# Patient Record
Sex: Male | Born: 1960
Health system: Southern US, Community
[De-identification: ages and names within clinical notes are randomized; demographics above are authoritative.]

## PROBLEM LIST (undated history)

## (undated) DIAGNOSIS — F418 Other specified anxiety disorders: Secondary | ICD-10-CM

## (undated) DIAGNOSIS — I10 Essential (primary) hypertension: Secondary | ICD-10-CM

## (undated) DIAGNOSIS — T7840XA Allergy, unspecified, initial encounter: Secondary | ICD-10-CM

## (undated) DIAGNOSIS — E785 Hyperlipidemia, unspecified: Secondary | ICD-10-CM

## (undated) DIAGNOSIS — F410 Panic disorder [episodic paroxysmal anxiety] without agoraphobia: Secondary | ICD-10-CM

## (undated) DIAGNOSIS — I2510 Atherosclerotic heart disease of native coronary artery without angina pectoris: Secondary | ICD-10-CM

## (undated) DIAGNOSIS — F319 Bipolar disorder, unspecified: Secondary | ICD-10-CM

## (undated) DIAGNOSIS — Z87442 Personal history of urinary calculi: Secondary | ICD-10-CM

## (undated) DIAGNOSIS — T300 Burn of unspecified body region, unspecified degree: Secondary | ICD-10-CM

## (undated) DIAGNOSIS — G4700 Insomnia, unspecified: Secondary | ICD-10-CM

## (undated) DIAGNOSIS — M6282 Rhabdomyolysis: Principal | ICD-10-CM

## (undated) DIAGNOSIS — I219 Acute myocardial infarction, unspecified: Secondary | ICD-10-CM

## (undated) DIAGNOSIS — K219 Gastro-esophageal reflux disease without esophagitis: Secondary | ICD-10-CM

## (undated) DIAGNOSIS — K8020 Calculus of gallbladder without cholecystitis without obstruction: Secondary | ICD-10-CM

## (undated) HISTORY — DX: Essential (primary) hypertension: I10

## (undated) HISTORY — DX: Hyperlipidemia, unspecified: E78.5

## (undated) HISTORY — DX: Acute myocardial infarction, unspecified: I21.9

## (undated) HISTORY — DX: Calculus of gallbladder without cholecystitis without obstruction: K80.20

## (undated) HISTORY — DX: Insomnia, unspecified: G47.00

## (undated) HISTORY — DX: Atherosclerotic heart disease of native coronary artery without angina pectoris: I25.10

## (undated) HISTORY — DX: Personal history of urinary calculi: Z87.442

## (undated) HISTORY — DX: Burn of unspecified body region, unspecified degree: T30.0

## (undated) HISTORY — DX: Allergy, unspecified, initial encounter: T78.40XA

## (undated) HISTORY — DX: Gastro-esophageal reflux disease without esophagitis: K21.9

## (undated) HISTORY — DX: Panic disorder (episodic paroxysmal anxiety): F41.0

## (undated) HISTORY — PX: CORONARY ANGIOPLASTY WITH STENT PLACEMENT: SHX49

## (undated) HISTORY — DX: Other specified anxiety disorders: F41.8

---

## 1980-06-28 HISTORY — PX: ORIF SHOULDER DISLOCATION W/ HUMERAL FRACTURE: SUR959

## 1980-06-28 HISTORY — PX: SKIN FULL THICKNESS GRAFT: SHX442

## 1998-08-19 ENCOUNTER — Encounter: Payer: Self-pay | Admitting: Emergency Medicine

## 1998-08-19 ENCOUNTER — Emergency Department (HOSPITAL_COMMUNITY): Admission: EM | Admit: 1998-08-19 | Discharge: 1998-08-19 | Payer: Self-pay | Admitting: Emergency Medicine

## 1999-02-18 ENCOUNTER — Ambulatory Visit (HOSPITAL_COMMUNITY): Admission: RE | Admit: 1999-02-18 | Discharge: 1999-02-18 | Payer: Self-pay | Admitting: *Deleted

## 1999-02-18 ENCOUNTER — Encounter: Payer: Self-pay | Admitting: *Deleted

## 1999-03-09 ENCOUNTER — Ambulatory Visit (HOSPITAL_COMMUNITY): Admission: AD | Admit: 1999-03-09 | Discharge: 1999-03-09 | Payer: Self-pay | Admitting: *Deleted

## 1999-03-09 ENCOUNTER — Encounter: Payer: Self-pay | Admitting: *Deleted

## 1999-03-20 ENCOUNTER — Encounter: Payer: Self-pay | Admitting: *Deleted

## 1999-03-20 ENCOUNTER — Ambulatory Visit (HOSPITAL_COMMUNITY): Admission: RE | Admit: 1999-03-20 | Discharge: 1999-03-20 | Payer: Self-pay | Admitting: *Deleted

## 2000-10-19 ENCOUNTER — Emergency Department (HOSPITAL_COMMUNITY): Admission: EM | Admit: 2000-10-19 | Discharge: 2000-10-20 | Payer: Self-pay | Admitting: Emergency Medicine

## 2000-11-10 ENCOUNTER — Ambulatory Visit (HOSPITAL_COMMUNITY): Admission: RE | Admit: 2000-11-10 | Discharge: 2000-11-10 | Payer: Self-pay | Admitting: Gastroenterology

## 2003-05-02 ENCOUNTER — Encounter: Payer: Self-pay | Admitting: Cardiology

## 2003-06-29 LAB — HM COLONOSCOPY: HM Colonoscopy: NORMAL

## 2009-01-29 ENCOUNTER — Emergency Department: Payer: Self-pay | Admitting: Unknown Physician Specialty

## 2009-04-09 ENCOUNTER — Emergency Department (HOSPITAL_COMMUNITY): Admission: EM | Admit: 2009-04-09 | Discharge: 2009-04-09 | Payer: Self-pay | Admitting: Emergency Medicine

## 2009-04-29 ENCOUNTER — Encounter: Payer: Self-pay | Admitting: Cardiology

## 2009-07-23 ENCOUNTER — Encounter: Payer: Self-pay | Admitting: Cardiology

## 2009-07-24 ENCOUNTER — Encounter: Payer: Self-pay | Admitting: Cardiology

## 2009-07-29 ENCOUNTER — Encounter: Payer: Self-pay | Admitting: Cardiology

## 2009-07-29 DIAGNOSIS — R079 Chest pain, unspecified: Secondary | ICD-10-CM

## 2009-07-29 DIAGNOSIS — Z9861 Coronary angioplasty status: Secondary | ICD-10-CM

## 2009-07-30 ENCOUNTER — Encounter (INDEPENDENT_AMBULATORY_CARE_PROVIDER_SITE_OTHER): Payer: Self-pay | Admitting: *Deleted

## 2009-07-30 ENCOUNTER — Ambulatory Visit: Payer: Self-pay | Admitting: Cardiology

## 2009-07-30 DIAGNOSIS — R0989 Other specified symptoms and signs involving the circulatory and respiratory systems: Secondary | ICD-10-CM

## 2009-07-30 DIAGNOSIS — K591 Functional diarrhea: Secondary | ICD-10-CM

## 2009-07-30 DIAGNOSIS — I10 Essential (primary) hypertension: Secondary | ICD-10-CM

## 2009-07-30 DIAGNOSIS — F322 Major depressive disorder, single episode, severe without psychotic features: Secondary | ICD-10-CM | POA: Insufficient documentation

## 2009-07-31 ENCOUNTER — Telehealth (INDEPENDENT_AMBULATORY_CARE_PROVIDER_SITE_OTHER): Payer: Self-pay | Admitting: *Deleted

## 2009-08-04 ENCOUNTER — Ambulatory Visit: Payer: Self-pay | Admitting: Cardiology

## 2009-08-04 ENCOUNTER — Encounter: Payer: Self-pay | Admitting: Cardiology

## 2009-10-16 ENCOUNTER — Ambulatory Visit (HOSPITAL_COMMUNITY): Payer: Self-pay | Admitting: Psychiatry

## 2010-07-28 NOTE — Progress Notes (Signed)
Summary: ED visit  Phone Note Other Incoming   Summary of Call: Per Dr. Andee Lineman, states pt. called his cell phone stating he was having worsening headaches and panic attacks.  MD advised pt. to go to ED for evaluation.   Initial call taken by: Hoover Brunette, LPN,  July 31, 2009 4:38 PM

## 2010-07-28 NOTE — Letter (Signed)
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 [address]   [place], [place] [address]   Phone: [phone]  Fax: [phone]    July 30, 2009   Employee:  KESHAWN FIORITO    To Whom It May Concern:   For Medical reasons, please excuse the above named employee from work for the following dates:  Start:   07/30/2009  End:   07/31/2009   MAY RETURN TO WORK ON 07/31/2009  If you need additional information, please feel free to contact our office.         Sincerely,    Vicky Slaughter     [phone] ext. [phone] Patient Care Coordinator  Mr. Serpe is being scheduled @ [facility] on Monday, August 04, 2009 for a Stress Cardiolite test and cartoid dopplers.  He is to be at the hospital 7:30am for his test to begin at 8:00am.. The test will take approximately 4 hours.

## 2010-07-28 NOTE — Letter (Signed)
Summary: Pharmacist, community at [facility]. [address]   [place], [place] [address]   Phone: [phone]  Fax: [phone]      [facility] Cardiovascular Services  Cardiolite Stress Test     [facility]  Your doctor has ordered a Cardiolite Stress Test to help determine the condition of your heart during stress. If you take blood pressure medicine, ask your doctor if you should take it the day of your test. You should not have anything to eat or drink at least 4 hours before your test is scheduled, and no caffeine (coffee, tea, decaf. or chocolate) for 24 hours before your test.   You will need to register at the Outpatient/Main Entrance at the hospital 30 minutes before your appointment time. It is a good idea to bring a copy of your order with you. They will direct you to the Diagnostic Imaging (Radiology) Department.  You will be asked to undress from the waist up and be given a hospital gown to wear, so dress comfortably from the waist down, for example:    Sweat pants, shorts or skirt   Rubber-soled lace up shoes (i.e. tennis shoes)  Plan on about three hours from registration to release from the hospital.    Date of Test:              Time of Test

## 2010-07-28 NOTE — Letter (Signed)
Summary: [facility]  [facility]   Imported By: Zachary George 07/29/2009 14:09:34  _____________________________________________________________________  External Attachment:    Type:   Image     Comment:   External Document

## 2010-07-28 NOTE — Assessment & Plan Note (Signed)
Summary: NP-CHEST PAIN  SASSER REQUESTED ASAP   Visit Type:  chest pain Primary Provider:  Dr.Sasser  CC:  chest pain.  History of Present Illness: the patientis a 50 year old male with a history of coronary artery disease. The patient status post myocardial infarction in 2004 [place]. He underwent stent placement x2. He has not received any cardiac followup. At that time he presented with sudden onset of nausea diaphoresis and severe substernal chest pain. The patient hast been referred by Dr. Cleta Alberts evaluation of atypical chest pain. The patient has a number of complaints including headache is both frontal and occipital. He states his headache is constant and has been so for several months. For the last week he also suffers from severe insomnia. He feels very anxious and appears to have frequent panic attacks. At that time he feels his heart racing, feels suffocated short of breath tight feeling in his chest. He also notices that his heart races quite a bit. He denies however any presyncope or syncope.  Patient moved up from [place] to take care of this 76 year old mother. However he has no social network here and feels poorly accepted in the community of the [place]. His son is still in [place]. The patient feels very depressed. He reports to me that several months ago in brief suicidal thoughts, but definitely has no recurrent feelings of suicidal ideation.   The patient has multiple risk factors for coronary arteries in addition to his known coronary artery disease and continues to smoke up to a pack a day. The patient denies any orthopnea or PND.  Preventive Screening-Counseling & Management  Alcohol-Tobacco     Smoking Status: current     Smoking Cessation Counseling: yes     Packs/Day: 1 PPD  Current Medications (verified): 1)  Multivitamins  Tabs (Multiple Vitamin) .... Take 1 Tablet By Mouth Once A Day 2)  Paxil 40 Mg Tabs (Paroxetine Hcl) .... Take 1 Tablet By Mouth Once A Day 3)   Clonazepam 0.25 Mg Tbdp (Clonazepam) .... Take 1 Tablet By Mouth Two Times A Day 4)  Trazodone Hcl 50 Mg Tabs (Trazodone Hcl) .... Take 1 Tab By Mouth At Bedtime, May Increase To 100mg  As Needed 5)  Propranolol Hcl Cr 80 Mg Xr24h-Cap (Propranolol Hcl) .... Take One Tab Every Morning  Allergies (verified): 1)  ! Codeine  Comments:  Nurse/Medical Assistant: The patient's medications and allergies were reviewed with the patient and were updated in the Medication and Allergy Lists. Verbally gave names.  Social History: Packs/Day:  1 PPD  Vital Signs:  Patient profile:   50 year old male Height:      66 inches Weight:      147 pounds BMI:     23.81 Pulse rate:   87 / minute BP sitting:   168 / 98  (left arm) Cuff size:   regular  Vitals Entered By: Carlye Grippe (July 30, 2009 8:12 AM) CC: chest pain    EKG  Procedure date:  07/30/2009  Findings:      normal sinus rhythm heart rate 82 beats per minute. PR Interval 126 ms. QTC 460 ms. IVCD otherwise normal tracing  Impression & Recommendations:  Problem # 1:  MAJ DPRSV D/O 1 EPIS SEV W/O MENTION PSYCHOT BHV (ICD-296.23) the patient does not appear to be responding to Paroxetine. His dose was increased from 20 mg 40 mg several months ago. I told him that I will switch him to venlafaxine however because of the relative contraindication  with ischemia we'll first perform a stress test. I also start the patient clonazepam 0.25 mg p.o. b.i.d. and trazodone up to 100 mg q.h.s. for his insomnia and also synergistic medication to hisParoxitine. I told the patient that he has suicidal thoughts he will need to call me immediately and the patient should be hospitalized. He is understanding of my recommendations and will keep in close touch. I gave the patient my cell phone number he has any immediate questions.  Problem # 2:  CAROTID BRUIT, RIGHT (ICD-785.9) the patient is a right carotid bruit on exam.ordered carotid  Dopplers Orders: Carotid Duplex (Carotid Duplex)  Problem # 3:  CHEST PAIN UNSPECIFIED (ICD-786.50) although the patient has known coronary artery disease I feel his chest pain is very atypical. He does need a exercise Cardiolite study, participates this may be normal. His chest pain is much more likely related to anxiety. I think his anxiety is also response to depression. The patient's EKG was reviewed and demonstrates normal sinus rhythm nonspecific IVCD in no acute ischemic changes. His updated medication list for this problem includes:    Propranolol Hcl Cr 80 Mg Xr24h-cap (Propranolol hcl) .Marland Kitchen... Take one tab every morning  Orders: EKG w/ Interpretation (93000) Nuclear Med (Nuc Med)  Problem # 4:  FUNCTIONAL DIARRHEA (ICD-564.5) the patient reports loose stools as a matter of fact with incontinence and is currently wearing diapers. The patient is very concerned and shameful about this. I told him it is likely related to his anxiety and panic attacks and is more of a functional nature. However the symptoms did not improve on medical therapy or GI evaluation may be needed  Problem # 5:  ESSENTIAL HYPERTENSION, BENIGN (ICD-401.1) the patient's blood pressure poorly controlled. Again this could be an ascending of anxiety. I prescribed propranolol as an antihypertensive, anticipate that his beta blockade may help with anxiety. We will follow blood pressure closely. His updated medication list for this problem includes:    Propranolol Hcl Cr 80 Mg Xr24h-cap (Propranolol hcl) .Marland Kitchen... Take one tab every morning  Patient Instructions: 1)  Exercise Stress Test 2)  Carotid Doppler 3)  Clonazepam 0.25mg  two times a day  4)  Trazodone 50mg  at bedtime, may increase to 100mg  if needed  5)  Propranolol LA 80mg  every morning  6)  Follow up in  2 weeks. Prescriptions: PROPRANOLOL HCL CR 80 MG XR24H-CAP (PROPRANOLOL HCL) take one tab every morning  #60 x 0   Entered by:   Hoover Brunette, LPN   Authorized  by:   Lewayne Bunting, MD, [facility]   Signed by:   Hoover Brunette, LPN on 16/03/9603   Method used:   Electronically to        [facility] (retail)       [address]       [place], [place]  [address]       Ph: [phone]       Fax: [phone]   RxID:   [id number] TRAZODONE HCL 50 MG TABS (TRAZODONE HCL) Take 1 tab by mouth at bedtime, may increase to 100mg  as needed  #60 x 0   Entered by:   Hoover Brunette, LPN   Authorized by:   Lewayne Bunting, MD, [facility]   Signed by:   Hoover Brunette, LPN on 04/24/2535   Method used:   Electronically to        [facility] (retail)  [address]       [place], [place]  [address]       Ph: [phone]       Fax: [phone]   RxID:   [id number] CLONAZEPAM 0.25 MG TBDP (CLONAZEPAM) Take 1 tablet by mouth two times a day  #30 x 0   Entered by:   Hoover Brunette, LPN   Authorized by:   Lewayne Bunting, MD, [facility]   Signed by:   Hoover Brunette, LPN on 87/56/4332   Method used:   Print then Give to Patient   RxID:   [id number]

## 2010-07-28 NOTE — Miscellaneous (Signed)
  Clinical Lists Changes  Observations: Added new observation of NUCST CONC: Comments :             1. This study was performed using the Standard Bruce exercise protocol. The patient exercised into stage 3 reaching 146 bpm, 85% MPHR. Maximum METs of 10.1 were achieved.The patient experienced no chest pain. Abnormal ST changes, 1-1.5 mm ST depression II/III/aVF near peak. There was a hypertensive response to stress.  2. Probably abnormal LV perfusion. Limitations and artifact were due to diaphragm or other soft tissue. There was a medium, partially reversible defect in the basal inferior, mid inferoseptal, mid inferior, apical inferior segment(s). The degree of photon reduction was mild to moderate. Suspect related to inferior scar and minor peri-infact ischemia. The patient's calculated post stress LVEF was 49%. TID Ratio normal at 1.04.       (08/04/2009 12:25)      Nuclear ETT  Procedure date:  08/04/2009  Findings:      Comments :             1. This study was performed using the Standard Bruce exercise protocol. The patient exercised into stage 3 reaching 146 bpm, 85% MPHR. Maximum METs of 10.1 were achieved.The patient experienced no chest pain. Abnormal ST changes, 1-1.5 mm ST depression II/III/aVF near peak. There was a hypertensive response to stress.  2. Probably abnormal LV perfusion. Limitations and artifact were due to diaphragm or other soft tissue. There was a medium, partially reversible defect in the basal inferior, mid inferoseptal, mid inferior, apical inferior segment(s). The degree of photon reduction was mild to moderate. Suspect related to inferior scar and minor peri-infact ischemia. The patient's calculated post stress LVEF was 49%. TID Ratio normal at 1.04.        Appended Document:  No definite ischemia- routine f/u in clinic.   Appended Document:  Pt has appt for 2/14 as 2 wk f/u. Does he still need to keep this appt or have later follow up?  Appended  Document:  I think he cancelled appointment.

## 2010-07-28 NOTE — Letter (Signed)
Summary: Internal Other/ DEPRESSION SCALE  Internal Other/ DEPRESSION SCALE   Imported By: Dorise Hiss 07/31/2009 10:58:24  _____________________________________________________________________  External Attachment:    Type:   Image     Comment:   External Document

## 2010-07-28 NOTE — Progress Notes (Signed)
Summary: Office Visit/ BLOOD PRESSURE READINGS  Office Visit/ BLOOD PRESSURE READINGS   Imported By: Dorise Hiss 07/31/2009 10:55:56  _____________________________________________________________________  External Attachment:    Type:   Image     Comment:   External Document

## 2010-07-28 NOTE — Letter (Signed)
Summary: External Correspondence/ [facility]  External [facility]   Imported By: Dorise Hiss 08/04/2009 12:38:14  _____________________________________________________________________  External Attachment:    Type:   Image     Comment:   External Document

## 2010-08-06 ENCOUNTER — Inpatient Hospital Stay: Admission: EM | Admit: 2010-08-06 | Payer: Self-pay | Source: Other Acute Inpatient Hospital | Admitting: Cardiology

## 2010-08-06 ENCOUNTER — Inpatient Hospital Stay (HOSPITAL_COMMUNITY)
Admission: EM | Admit: 2010-08-06 | Discharge: 2010-08-08 | DRG: 246 | Disposition: A | Discharge status: Home / Self Care | Payer: 59 | Source: Ambulatory Visit | Attending: Cardiovascular Disease | Admitting: Cardiovascular Disease

## 2010-08-06 DIAGNOSIS — Z79899 Other long term (current) drug therapy: Secondary | ICD-10-CM

## 2010-08-06 DIAGNOSIS — I498 Other specified cardiac arrhythmias: Secondary | ICD-10-CM

## 2010-08-06 DIAGNOSIS — F17200 Nicotine dependence, unspecified, uncomplicated: Secondary | ICD-10-CM | POA: Diagnosis present

## 2010-08-06 DIAGNOSIS — I4901 Ventricular fibrillation: Secondary | ICD-10-CM | POA: Diagnosis not present

## 2010-08-06 DIAGNOSIS — E876 Hypokalemia: Secondary | ICD-10-CM | POA: Diagnosis present

## 2010-08-06 DIAGNOSIS — Z7982 Long term (current) use of aspirin: Secondary | ICD-10-CM

## 2010-08-06 DIAGNOSIS — I2510 Atherosclerotic heart disease of native coronary artery without angina pectoris: Secondary | ICD-10-CM

## 2010-08-06 DIAGNOSIS — F341 Dysthymic disorder: Secondary | ICD-10-CM | POA: Diagnosis present

## 2010-08-06 DIAGNOSIS — R0902 Hypoxemia: Secondary | ICD-10-CM

## 2010-08-06 DIAGNOSIS — I2119 ST elevation (STEMI) myocardial infarction involving other coronary artery of inferior wall: Principal | ICD-10-CM | POA: Diagnosis present

## 2010-08-06 DIAGNOSIS — Z9861 Coronary angioplasty status: Secondary | ICD-10-CM

## 2010-08-06 DIAGNOSIS — J9600 Acute respiratory failure, unspecified whether with hypoxia or hypercapnia: Secondary | ICD-10-CM | POA: Diagnosis not present

## 2010-08-06 DIAGNOSIS — I252 Old myocardial infarction: Secondary | ICD-10-CM

## 2010-08-06 DIAGNOSIS — I6529 Occlusion and stenosis of unspecified carotid artery: Secondary | ICD-10-CM | POA: Diagnosis present

## 2010-08-06 DIAGNOSIS — R570 Cardiogenic shock: Secondary | ICD-10-CM | POA: Diagnosis not present

## 2010-08-06 DIAGNOSIS — Z7902 Long term (current) use of antithrombotics/antiplatelets: Secondary | ICD-10-CM

## 2010-08-06 DIAGNOSIS — I469 Cardiac arrest, cause unspecified: Secondary | ICD-10-CM | POA: Diagnosis not present

## 2010-08-06 DIAGNOSIS — I10 Essential (primary) hypertension: Secondary | ICD-10-CM

## 2010-08-06 LAB — TSH: TSH: 1.015 u[IU]/mL (ref 0.350–4.500)

## 2010-08-06 LAB — DIFFERENTIAL
Lymphocytes Relative: 18 % (ref 12–46)
Lymphs Abs: 1.7 10*3/uL (ref 0.7–4.0)
Neutrophils Relative %: 74 % (ref 43–77)

## 2010-08-06 LAB — MRSA PCR SCREENING: MRSA by PCR: NEGATIVE

## 2010-08-06 LAB — COMPREHENSIVE METABOLIC PANEL
Albumin: 3.4 g/dL — ABNORMAL LOW (ref 3.5–5.2)
BUN: 6 mg/dL (ref 6–23)
Calcium: 7.9 mg/dL — ABNORMAL LOW (ref 8.4–10.5)
Creatinine, Ser: 0.85 mg/dL (ref 0.4–1.5)
GFR calc Af Amer: >60 mL/min (ref >60)
Total Bilirubin: 0.9 mg/dL (ref 0.3–1.2)
Total Protein: 5.8 g/dL — ABNORMAL LOW (ref 6.0–8.3)

## 2010-08-06 LAB — PROTIME-INR: INR: 4.14 — ABNORMAL HIGH (ref 0.00–1.49)

## 2010-08-06 LAB — CARDIAC PANEL(CRET KIN+CKTOT+MB+TROPI)
Total CK: 867 U/L — ABNORMAL HIGH (ref 7–232)
Troponin I: 20.58 ng/mL (ref 0.00–0.06)

## 2010-08-06 LAB — CBC
HCT: 35 % — ABNORMAL LOW (ref 39.0–52.0)
Hemoglobin: 12.6 g/dL — ABNORMAL LOW (ref 13.0–17.0)
MCV: 99.2 fL (ref 78.0–100.0)
RBC: 3.53 MIL/uL — ABNORMAL LOW (ref 4.22–5.81)
WBC: 9.9 10*3/uL (ref 4.0–10.5)

## 2010-08-06 LAB — GLUCOSE, CAPILLARY: Glucose-Capillary: 88 mg/dL (ref 70–99)

## 2010-08-06 LAB — APTT: aPTT: >200 seconds (ref 24–37)

## 2010-08-07 ENCOUNTER — Inpatient Hospital Stay (HOSPITAL_COMMUNITY): Payer: 59

## 2010-08-07 DIAGNOSIS — I219 Acute myocardial infarction, unspecified: Secondary | ICD-10-CM

## 2010-08-07 LAB — POCT I-STAT 3, ART BLOOD GAS (G3+)
O2 Saturation: 100 %
O2 Saturation: 100 %
TCO2: 21 mmol/L (ref 0–100)
pCO2 arterial: 37.8 mmHg (ref 35.0–45.0)
pCO2 arterial: 32.2 mmHg — ABNORMAL LOW (ref 35.0–45.0)

## 2010-08-07 LAB — DRUGS OF ABUSE SCREEN W/O ALC, ROUTINE URINE
Amphetamine Screen, Ur: NEGATIVE
Amphetamine Screen, Ur: NEGATIVE
Barbiturate Quant, Ur: NEGATIVE
Creatinine,U: 57.2 mg/dL
Marijuana Metabolite: NEGATIVE
Marijuana Metabolite: NEGATIVE
Methadone: NEGATIVE
Methadone: NEGATIVE
Phencyclidine (PCP): NEGATIVE

## 2010-08-07 LAB — CBC
HCT: 32.2 % — ABNORMAL LOW (ref 39.0–52.0)
MCHC: 34.5 g/dL (ref 30.0–36.0)
RDW: 12.4 % (ref 11.5–15.5)

## 2010-08-07 LAB — POCT I-STAT, CHEM 8
BUN: 6 mg/dL (ref 6–23)
Creatinine, Ser: 0.8 mg/dL (ref 0.4–1.5)
HCT: 35 % — ABNORMAL LOW (ref 39.0–52.0)
Hemoglobin: 11.9 g/dL — ABNORMAL LOW (ref 13.0–17.0)
Hemoglobin: 12.9 g/dL — ABNORMAL LOW (ref 13.0–17.0)
Sodium: 133 mEq/L — ABNORMAL LOW (ref 135–145)
Sodium: 141 mEq/L (ref 135–145)
TCO2: 17 mmol/L (ref 0–100)
TCO2: 19 mmol/L (ref 0–100)

## 2010-08-07 LAB — LIPID PANEL
Cholesterol: 133 mg/dL (ref 0–200)
HDL: 39 mg/dL — ABNORMAL LOW (ref >39)
Triglycerides: 121 mg/dL (ref <150)

## 2010-08-07 LAB — POCT ACTIVATED CLOTTING TIME: Activated Clotting Time: 458 seconds

## 2010-08-07 LAB — CARDIAC PANEL(CRET KIN+CKTOT+MB+TROPI)
CK, MB: 58.3 ng/mL (ref 0.3–4.0)
Relative Index: 9.9 — ABNORMAL HIGH (ref 0.0–2.5)
Relative Index: 7.5 — ABNORMAL HIGH (ref 0.0–2.5)
Total CK: 1039 U/L — ABNORMAL HIGH (ref 7–232)
Troponin I: 45.58 ng/mL (ref 0.00–0.06)

## 2010-08-07 LAB — BASIC METABOLIC PANEL
GFR calc non Af Amer: >60 mL/min (ref >60)
Potassium: 3.7 mEq/L (ref 3.5–5.1)
Sodium: 140 mEq/L (ref 135–145)

## 2010-08-07 LAB — PLATELET INHIBITION P2Y12: Platelet Function  P2Y12: 79 [PRU] — ABNORMAL LOW (ref 194–418)

## 2010-08-10 ENCOUNTER — Telehealth: Payer: Self-pay | Admitting: Cardiovascular Disease

## 2010-08-11 LAB — OPIATE, QUANTITATIVE, URINE
Codeine Urine: NEGATIVE NG/ML
Hydrocodone: NEGATIVE NG/ML
Hydromorphone GC/MS Conf: NEGATIVE NG/ML
Morphine, Confirm: 3737 NG/ML — ABNORMAL HIGH
Oxycodone, ur: NEGATIVE NG/ML
Oxymorphone: NEGATIVE NG/ML

## 2010-08-11 LAB — BENZODIAZEPINE, QUANTITATIVE, URINE
Alprazolam (GC/LC/MS), ur confirm: NEGATIVE NG/ML
Temazepam GC/MS Conf: NEGATIVE NG/ML

## 2010-08-12 LAB — OPIATE, QUANTITATIVE, URINE
Codeine Urine: NEGATIVE NG/ML
Hydrocodone: NEGATIVE NG/ML
Hydromorphone GC/MS Conf: NEGATIVE NG/ML
Morphine, Confirm: 3043 NG/ML — ABNORMAL HIGH
Oxycodone, ur: NEGATIVE NG/ML

## 2010-08-12 LAB — BENZODIAZEPINE, QUANTITATIVE, URINE
Alprazolam (GC/LC/MS), ur confirm: NEGATIVE ng/mL
Flurazepam GC/MS Conf: NEGATIVE ng/mL
Lorazepam UR QT: NEGATIVE ng/mL
Nordiazepam GC/MS Conf: NEGATIVE ng/mL
Oxazepam GC/MS Conf: NEGATIVE ng/mL
Temazepam GC/MS Conf: NEGATIVE ng/mL

## 2010-08-19 NOTE — Progress Notes (Signed)
Summary:  want a prescription for depression  Phone Note Call from Patient Call back at [phone]   Caller: Patient Summary of Call: Pt want a prescription for depression  Initial call taken by: Judie Grieve,  August 10, 2010 11:42 AM  Follow-up for Phone Call        The pt is having a rough time since MI and having difficulty sleeping.  The pt said he was given Xanax in the hospital but did not get a Rx.  The pt currently does not have a PCP and he agreed to try and arrange an appt with PCP in his area for long term management of depression and anxiety.  The pt has taken Zoloft and Xanax in the past for depression and anxiety but the Zoloft did not help.  I will speak with Dr Excell Seltzer about the pt's situation. 08/26/10 [facility] appt with Dr Excell Seltzer scheduled.  Follow-up by: Julieta Gutting, RN, BSN,  August 10, 2010 12:01 PM  Additional Follow-up for Phone Call Additional follow up Details #1::        Per Dr Excell Seltzer the pt can have a Rx for Xanax 0.5mg  take one by mouth two times a day as needed #30 No Refills.  I spoke with the pt and he will come by our office and pick-up Rx at the front desk.  I made the pt aware that Dr Excell Seltzer will only give this one Rx and future prescriptions should come from PCP.  The pt was given information about [facility] on [facility].  Additional Follow-up by: Julieta Gutting, RN, BSN,  August 11, 2010 10:50 AM    New/Updated Medications: ALPRAZOLAM 0.5 MG TABS (ALPRAZOLAM) take one by mouth two times a day as needed Prescriptions: ALPRAZOLAM 0.5 MG TABS (ALPRAZOLAM) take one by mouth two times a day as needed  #30 x 0   Entered by:   Julieta Gutting, RN, BSN   Authorized by:   Norva Karvonen, MD   Signed by:   Julieta Gutting, RN, BSN on 08/11/2010   Method used:   Print then Give to Patient   RxID:   [id number]

## 2010-08-20 ENCOUNTER — Other Ambulatory Visit: Payer: 59

## 2010-08-20 ENCOUNTER — Telehealth: Payer: Self-pay | Admitting: Internal Medicine

## 2010-08-20 ENCOUNTER — Encounter: Payer: Self-pay | Admitting: Internal Medicine

## 2010-08-20 ENCOUNTER — Ambulatory Visit (INDEPENDENT_AMBULATORY_CARE_PROVIDER_SITE_OTHER): Payer: 59 | Admitting: Internal Medicine

## 2010-08-20 ENCOUNTER — Other Ambulatory Visit: Payer: Self-pay | Admitting: Internal Medicine

## 2010-08-20 DIAGNOSIS — R51 Headache: Secondary | ICD-10-CM

## 2010-08-20 DIAGNOSIS — E785 Hyperlipidemia, unspecified: Secondary | ICD-10-CM

## 2010-08-20 DIAGNOSIS — F329 Major depressive disorder, single episode, unspecified: Secondary | ICD-10-CM | POA: Insufficient documentation

## 2010-08-20 DIAGNOSIS — F17200 Nicotine dependence, unspecified, uncomplicated: Secondary | ICD-10-CM | POA: Insufficient documentation

## 2010-08-20 DIAGNOSIS — I2510 Atherosclerotic heart disease of native coronary artery without angina pectoris: Secondary | ICD-10-CM | POA: Insufficient documentation

## 2010-08-20 DIAGNOSIS — I10 Essential (primary) hypertension: Secondary | ICD-10-CM

## 2010-08-20 DIAGNOSIS — F4322 Adjustment disorder with anxiety: Secondary | ICD-10-CM

## 2010-08-20 DIAGNOSIS — R519 Headache, unspecified: Secondary | ICD-10-CM | POA: Insufficient documentation

## 2010-08-20 DIAGNOSIS — IMO0001 Reserved for inherently not codable concepts without codable children: Secondary | ICD-10-CM

## 2010-08-20 DIAGNOSIS — I252 Old myocardial infarction: Secondary | ICD-10-CM | POA: Insufficient documentation

## 2010-08-20 LAB — HEPATIC FUNCTION PANEL
ALT: 28 U/L (ref 0–53)
Albumin: 3.6 g/dL (ref 3.5–5.2)
Alkaline Phosphatase: 211 U/L — ABNORMAL HIGH (ref 39–117)
Bilirubin, Direct: 0 mg/dL (ref 0.0–0.3)
Total Protein: 7 g/dL (ref 6.0–8.3)

## 2010-08-20 LAB — CBC WITH DIFFERENTIAL/PLATELET
Basophils Relative: 0.3 % (ref 0.0–3.0)
Eosinophils Absolute: 0.2 10*3/uL (ref 0.0–0.7)
Eosinophils Relative: 2.1 % (ref 0.0–5.0)
Hemoglobin: 13.1 g/dL (ref 13.0–17.0)
MCHC: 35.2 g/dL (ref 30.0–36.0)
MCV: 102 fl — ABNORMAL HIGH (ref 78.0–100.0)
Monocytes Absolute: 0.5 10*3/uL (ref 0.1–1.0)
Neutro Abs: 6 10*3/uL (ref 1.4–7.7)
Neutrophils Relative %: 69.8 % (ref 43.0–77.0)
RBC: 3.67 Mil/uL — ABNORMAL LOW (ref 4.22–5.81)
WBC: 8.5 10*3/uL (ref 4.5–10.5)

## 2010-08-20 LAB — URINALYSIS, ROUTINE W REFLEX MICROSCOPIC
Ketones, ur: NEGATIVE
Specific Gravity, Urine: 1.01 (ref 1.000–1.030)
Total Protein, Urine: NEGATIVE
Urine Glucose: NEGATIVE

## 2010-08-20 LAB — BASIC METABOLIC PANEL
CO2: 29 mEq/L (ref 19–32)
Chloride: 105 mEq/L (ref 96–112)
Creatinine, Ser: 0.8 mg/dL (ref 0.4–1.5)
Potassium: 4.6 mEq/L (ref 3.5–5.1)
Sodium: 142 mEq/L (ref 135–145)

## 2010-08-20 LAB — HIGH SENSITIVITY CRP: CRP, High Sensitivity: 33.72 mg/L — ABNORMAL HIGH (ref 0.00–5.00)

## 2010-08-24 ENCOUNTER — Other Ambulatory Visit: Payer: Self-pay | Admitting: Internal Medicine

## 2010-08-24 DIAGNOSIS — R51 Headache: Secondary | ICD-10-CM

## 2010-08-25 NOTE — Assessment & Plan Note (Signed)
Summary: NEW [employer] PT--#---PKG---STC   Vital Signs:  Patient profile:   50 year old male Height:      66 inches Weight:      137 pounds BMI:     22.19 O2 Sat:      98 % on Room air Temp:     98.4 degrees F oral Pulse rate:   80 / minute Pulse rhythm:   regular Resp:     20 per minute BP sitting:   132 / 82  (left arm) Cuff size:   regular  Vitals Entered By: Rock Nephew CMA (August 20, 2010 10:49 AM)  O2 Flow:  Room air CC: New to establish, Headaches Is Patient Diabetic? No  Does patient need assistance? Functional Status Self care Ambulation Normal   Primary Care Provider:  [name] MD  CC:  New to establish and Headaches.  History of Present Illness:  Headaches      This is a 50 year old man who presents with Headaches.  The symptoms began 2 weeks ago.  On a scale of 1 to 10, the intensity is described as a 6.  The patient reports nausea, but denies vomiting, sweats, tearing of eyes, nasal congestion, sinus pain, sinus pressure, photophobia, and phonophobia.  The headache is described as constant and dull.  The location of the pain is bitemporal.  High-risk features (red flags) include new type of headache, age >50 years, and anticoagulation use.  The patient denies the following high-risk features: fever, neck pain/stiffness, vision loss or change, focal weakness, altered mental status, rash, trauma, pain worse with exertion, immunosuppression, and concomitant infection.  The headaches are precipitated by stress.  Prior treatment has included no medication.    Preventive Screening-Counseling & Management  Alcohol-Tobacco     Alcohol drinks/day: 0     Alcohol Counseling: not indicated; patient does not drink     Feels need to cut down: no     Smoking Status: current     Smoke Cessation Stage: quit     Tobacco Counseling: to remain off tobacco products  Caffeine-Diet-Exercise     Does Patient Exercise: yes  Hep-HIV-STD-Contraception     Hepatitis Risk:  no risk noted     HIV Risk: no risk noted     STD Risk: no risk noted      Sexual History:  currently monogamous.        Drug Use:  no.        Blood Transfusions:  no.    Medications Prior to Update: 1)  None  Current Medications (verified): 1)  Crestor 40 Mg Tabs (Rosuvastatin Calcium) .... Take 1 Tablet By Mouth Once A Day 2)  Metoprolol Tartrate 25 Mg Tabs (Metoprolol Tartrate) .... Take 1 Tablet By Mouth Two Times A Day 3)  Ecotrin 325 Mg Tbec (Aspirin) .... Take 1 Tablet By Mouth Once A Day 4)  Celebrex 200 Mg Caps (Celecoxib) .... One By Mouth Once Daily As Needed For Headache 5)  Clonazepam 1 Mg Tabs (Clonazepam) .Marland Kitchen.. 1-2 By Mouth Two Times A Day As Needed For Anxiety 6)  Medrol (Pak) 4 Mg Tabs (Methylprednisolone) .... Take As Directed For Headache  Allergies (verified): 1)  ! Codeine  Past History:  Past Medical History: Coronary artery disease Hyperlipidemia Hypertension Myocardial infarction, hx of Depression  Past Surgical History: PTCA/stent Rotator cuff repair  Family History: Family History of Arthritis Family History of CAD Male 1st degree relative <50 Family History of Colon CA 1st  degree relative <60 Family History Hypertension  Social History: Divorced Current Smoker Alcohol use-no Drug use-no Regular exercise-yes Smoking Status:  current Hepatitis Risk:  no risk noted HIV Risk:  no risk noted STD Risk:  no risk noted Sexual History:  currently monogamous Blood Transfusions:  no Drug Use:  no Does Patient Exercise:  yes  Review of Systems       The patient complains of headaches.  The patient denies anorexia, fever, weight loss, weight gain, vision loss, decreased hearing, chest pain, syncope, dyspnea on exertion, peripheral edema, prolonged cough, hemoptysis, abdominal pain, hematuria, suspicious skin lesions, transient blindness, difficulty walking, depression, enlarged lymph nodes, and angioedema.   General:  Complains of fatigue,  malaise, and sleep disorder; denies chills, fever, loss of appetite, sweats, weakness, and weight loss. Psych:  Complains of anxiety and panic attacks; denies alternate hallucination ( auditory/visual), depression, easily angered, easily tearful, irritability, mental problems, sense of great danger, suicidal thoughts/plans, thoughts of violence, unusual visions or sounds, and thoughts /plans of harming others.  Physical Exam  General:  alert, well-developed, well-nourished, well-hydrated, appropriate dress, normal appearance, healthy-appearing, cooperative to examination, good hygiene, and underweight appearing.   Head:  normocephalic, atraumatic, no abnormalities observed, and no abnormalities palpated.   Eyes:  vision grossly intact, pupils equal, pupils round, pupils reactive to light, pupils react to accomodation, and no injection.   Ears:  R ear normal and L ear normal.   Nose:  External nasal examination shows no deformity or inflammation. Nasal mucosa are pink and moist without lesions or exudates. Mouth:  Oral mucosa and oropharynx without lesions or exudates.  Teeth in good repair. Neck:  supple, full ROM, no masses, no thyromegaly, no thyroid nodules or tenderness, no JVD, no HJR, normal carotid upstroke, and no carotid bruits.   Lungs:  normal respiratory effort, no intercostal retractions, no accessory muscle use, normal breath sounds, no dullness, no fremitus, no crackles, and no wheezes.   Heart:  normal rate, regular rhythm, no murmur, no gallop, no rub, no JVD, and no HJR.   Abdomen:  soft, non-tender, normal bowel sounds, no distention, no masses, no guarding, no rigidity, no rebound tenderness, no abdominal hernia, no inguinal hernia, no hepatomegaly, and no splenomegaly.   Msk:  No deformity or scoliosis noted of thoracic or lumbar spine.   Pulses:  R and L carotid,radial,femoral,dorsalis pedis and posterior tibial pulses are full and equal bilaterally Extremities:  No clubbing,  cyanosis, edema, or deformity noted with normal full range of motion of all joints.   Neurologic:  No cranial nerve deficits noted. Station and gait are normal. Plantar reflexes are down-going bilaterally. DTRs are symmetrical throughout. Sensory, motor and coordinative functions appear intact. Skin:  turgor normal, color normal, no rashes, no suspicious lesions, no ecchymoses, no petechiae, no purpura, no ulcerations, and no edema.   Cervical Nodes:  no anterior cervical adenopathy and no posterior cervical adenopathy.   Axillary Nodes:  no R axillary adenopathy and no L axillary adenopathy.   Inguinal Nodes:  no R inguinal adenopathy and no L inguinal adenopathy.   Psych:  Oriented X3, memory intact for recent and remote, good eye contact, not agitated, not suicidal, not homicidal, flat affect, and slightly anxious.     Impression & Recommendations:  Problem # 1:  HEADACHE (ICD-784.0) the labs show an inflammatory process so medrol dose pack is started to treat refractory migraine, vasculitis, rebound headache. he also needs a brain imaging to look for mass, bleed, cva,  etc. His updated medication list for this problem includes:    Metoprolol Tartrate 25 Mg Tabs (Metoprolol tartrate) .Marland Kitchen... Take 1 tablet by mouth two times a day    Ecotrin 325 Mg Tbec (Aspirin) .Marland Kitchen... Take 1 tablet by mouth once a day    Celebrex 200 Mg Caps (Celecoxib) ..... One by mouth once daily as needed for headache  Orders: Venipuncture (04540) Radiology Referral (Radiology) TLB-BMP (Basic Metabolic Panel-BMET) (80048-METABOL) TLB-CBC Platelet - w/Differential (85025-CBCD) TLB-Hepatic/Liver Function Pnl (80076-HEPATIC) TLB-TSH (Thyroid Stimulating Hormone) (84443-TSH) TLB-CRP-High Sensitivity (C-Reactive Protein) (86140-FCRP) TLB-CK Total Only(Creatine Kinase/CPK) (82550-CK) TLB-Sedimentation Rate (ESR) (85652-ESR) TLB-Udip w/ Micro (81001-URINE)  Problem # 2:  HYPERTENSION (ICD-401.9) Assessment:  Improved  His updated medication list for this problem includes:    Metoprolol Tartrate 25 Mg Tabs (Metoprolol tartrate) .Marland Kitchen... Take 1 tablet by mouth two times a day  Orders: Venipuncture (98119) TLB-BMP (Basic Metabolic Panel-BMET) (80048-METABOL) TLB-CBC Platelet - w/Differential (85025-CBCD) TLB-Hepatic/Liver Function Pnl (80076-HEPATIC) TLB-TSH (Thyroid Stimulating Hormone) (84443-TSH) TLB-CRP-High Sensitivity (C-Reactive Protein) (86140-FCRP) TLB-CK Total Only(Creatine Kinase/CPK) (82550-CK) TLB-Sedimentation Rate (ESR) (85652-ESR) TLB-Udip w/ Micro (81001-URINE)  BP today: 132/82  Problem # 3:  HYPERLIPIDEMIA (ICD-272.4) Assessment: Unchanged  His updated medication list for this problem includes:    Crestor 40 Mg Tabs (Rosuvastatin calcium) .Marland Kitchen... Take 1 tablet by mouth once a day  Orders: Venipuncture (14782) TLB-BMP (Basic Metabolic Panel-BMET) (80048-METABOL) TLB-CBC Platelet - w/Differential (85025-CBCD) TLB-Hepatic/Liver Function Pnl (80076-HEPATIC) TLB-TSH (Thyroid Stimulating Hormone) (84443-TSH) TLB-CRP-High Sensitivity (C-Reactive Protein) (86140-FCRP) TLB-CK Total Only(Creatine Kinase/CPK) (82550-CK) TLB-Sedimentation Rate (ESR) (85652-ESR) TLB-Udip w/ Micro (81001-URINE)  Problem # 4:  ADJUSTMENT DISORDER WITH ANXIETY (NFA-213.08) Assessment: New try Klonnopin  Problem # 5:  MYALGIA (ICD-729.1) Assessment: New will check CPK level His updated medication list for this problem includes:    Ecotrin 325 Mg Tbec (Aspirin) .Marland Kitchen... Take 1 tablet by mouth once a day    Celebrex 200 Mg Caps (Celecoxib) ..... One by mouth once daily as needed for headache  Orders: Venipuncture (65784) TLB-BMP (Basic Metabolic Panel-BMET) (80048-METABOL) TLB-CBC Platelet - w/Differential (85025-CBCD) TLB-Hepatic/Liver Function Pnl (80076-HEPATIC) TLB-TSH (Thyroid Stimulating Hormone) (84443-TSH) TLB-CRP-High Sensitivity (C-Reactive Protein) (86140-FCRP) TLB-CK Total  Only(Creatine Kinase/CPK) (82550-CK) TLB-Sedimentation Rate (ESR) (85652-ESR) TLB-Udip w/ Micro (81001-URINE)  Complete Medication List: 1)  Crestor 40 Mg Tabs (Rosuvastatin calcium) .... Take 1 tablet by mouth once a day 2)  Metoprolol Tartrate 25 Mg Tabs (Metoprolol tartrate) .... Take 1 tablet by mouth two times a day 3)  Ecotrin 325 Mg Tbec (Aspirin) .... Take 1 tablet by mouth once a day 4)  Celebrex 200 Mg Caps (Celecoxib) .... One by mouth once daily as needed for headache 5)  Clonazepam 1 Mg Tabs (Clonazepam) .Marland Kitchen.. 1-2 by mouth two times a day as needed for anxiety 6)  Medrol (pak) 4 Mg Tabs (Methylprednisolone) .... Take as directed for headache  Patient Instructions: 1)  Please schedule a follow-up appointment in 3-5 days. 2)  Take 650-1000mg  of Tylenol every 4-6 hours as needed for relief of pain or comfort of fever AVOID taking more than 4000mg   in a 24 hour period (can cause liver damage in higher doses). Prescriptions: MEDROL (PAK) 4 MG TABS (METHYLPREDNISOLONE) take as directed for headache  #1 x 0   Entered and Authorized by:   [name] MD   Signed by:   [name] MD on 08/20/2010   Method used:   Print then Give to Patient   RxID:   [id number] MEDROL (PAK) 4 MG TABS (METHYLPREDNISOLONE)  take as directed for headache  #1 x 0   Entered and Authorized by:   [name] MD   Signed by:   [name] MD on 08/20/2010   Method used:   Historical   RxID:   [id number] CLONAZEPAM 1 MG TABS (CLONAZEPAM) 1-2 by mouth two times a day as needed for anxiety  #50 x 2   Entered and Authorized by:   [name] MD   Signed by:   [name] MD on 08/20/2010   Method used:   Print then Give to Patient   RxID:   [id number] CELEBREX 200 MG CAPS (CELECOXIB) One by mouth once daily as needed for headache  #15 x 0   Entered and Authorized by:   [name] MD   Signed by:   [name] MD on 08/20/2010   Method used:   Samples Given    RxID:   [id number]    Orders Added: 1)  Venipuncture [53664] 2)  Radiology Referral [Radiology] 3)  TLB-BMP (Basic Metabolic Panel-BMET) [80048-METABOL] 4)  TLB-CBC Platelet - w/Differential [85025-CBCD] 5)  TLB-Hepatic/Liver Function Pnl [80076-HEPATIC] 6)  TLB-TSH (Thyroid Stimulating Hormone) [84443-TSH] 7)  TLB-CRP-High Sensitivity (C-Reactive Protein) [86140-FCRP] 8)  TLB-CK Total Only(Creatine Kinase/CPK) [82550-CK] 9)  TLB-Sedimentation Rate (ESR) [85652-ESR] 10)  TLB-Udip w/ Micro [81001-URINE] 11)  New Patient Level V [99205]   Immunization History:  Influenza Immunization History:    Influenza:  historical (03/28/2010)   Immunization History:  Influenza Immunization History:    Influenza:  Historical (03/28/2010)  Preventive Care Screening  Last Tetanus Booster:    Date:  06/28/2010    Results:  Historical   Colonoscopy:    Date:  06/29/2003    Results:  normal

## 2010-08-25 NOTE — Progress Notes (Signed)
  Phone Note Outgoing Call   Summary of Call: Maralyn Sago- I saw this guy earlier today and the labs show that he has inflammation and I am concerned that his headache may be from vasculitis, please find out which pharmacy he wants this sent to. Thanks Initial call taken by: [name] MD,  August 20, 2010 1:32 PM  Follow-up for Phone Call        Pt informed  Follow-up by: Lamar Sprinkles, CMA,  August 20, 2010 5:04 PM    Prescriptions: MEDROL (PAK) 4 MG TABS (METHYLPREDNISOLONE) take as directed for headache  #1 x 0   Entered by:   Lamar Sprinkles, CMA   Authorized by:   [name] MD   Signed by:   Lamar Sprinkles, CMA on 08/20/2010   Method used:   Electronically to        [facility]  [phone]* (retail)       [address]       [place], [place]  [address]       Ph: [phone] or [phone]       Fax: [phone]   RxID:   [id number]

## 2010-08-26 ENCOUNTER — Encounter: Payer: Self-pay | Admitting: Cardiovascular Disease

## 2010-08-26 ENCOUNTER — Ambulatory Visit (INDEPENDENT_AMBULATORY_CARE_PROVIDER_SITE_OTHER)
Admission: RE | Admit: 2010-08-26 | Discharge: 2010-08-26 | Disposition: A | Discharge status: Home / Self Care | Payer: 59 | Source: Ambulatory Visit | Attending: Internal Medicine | Admitting: Internal Medicine

## 2010-08-26 ENCOUNTER — Encounter: Payer: 59 | Admitting: Cardiovascular Disease

## 2010-08-26 ENCOUNTER — Encounter (INDEPENDENT_AMBULATORY_CARE_PROVIDER_SITE_OTHER): Payer: 59 | Admitting: Cardiovascular Disease

## 2010-08-26 ENCOUNTER — Other Ambulatory Visit: Payer: 59

## 2010-08-26 DIAGNOSIS — I2119 ST elevation (STEMI) myocardial infarction involving other coronary artery of inferior wall: Secondary | ICD-10-CM

## 2010-08-26 DIAGNOSIS — R51 Headache: Secondary | ICD-10-CM

## 2010-08-26 DIAGNOSIS — R072 Precordial pain: Secondary | ICD-10-CM

## 2010-08-27 ENCOUNTER — Telehealth (INDEPENDENT_AMBULATORY_CARE_PROVIDER_SITE_OTHER): Payer: Self-pay | Admitting: *Deleted
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
## 2010-08-28 ENCOUNTER — Encounter (INDEPENDENT_AMBULATORY_CARE_PROVIDER_SITE_OTHER): Payer: Self-pay | Admitting: *Deleted

## 2010-08-31 ENCOUNTER — Encounter (INDEPENDENT_AMBULATORY_CARE_PROVIDER_SITE_OTHER): Payer: Self-pay | Admitting: *Deleted

## 2010-08-31 ENCOUNTER — Encounter: Payer: Self-pay | Admitting: Internal Medicine

## 2010-08-31 ENCOUNTER — Ambulatory Visit (HOSPITAL_COMMUNITY): Payer: 59 | Attending: Cardiovascular Disease

## 2010-08-31 DIAGNOSIS — R0989 Other specified symptoms and signs involving the circulatory and respiratory systems: Secondary | ICD-10-CM

## 2010-09-01 ENCOUNTER — Telehealth (INDEPENDENT_AMBULATORY_CARE_PROVIDER_SITE_OTHER): Payer: Self-pay | Admitting: *Deleted

## 2010-09-02 ENCOUNTER — Ambulatory Visit (HOSPITAL_COMMUNITY): Payer: 59 | Attending: Cardiovascular Disease

## 2010-09-02 ENCOUNTER — Encounter: Payer: Self-pay | Admitting: Internal Medicine

## 2010-09-02 ENCOUNTER — Encounter: Payer: Self-pay | Admitting: Cardiology

## 2010-09-02 DIAGNOSIS — I252 Old myocardial infarction: Secondary | ICD-10-CM | POA: Insufficient documentation

## 2010-09-02 DIAGNOSIS — R079 Chest pain, unspecified: Secondary | ICD-10-CM

## 2010-09-02 DIAGNOSIS — R0602 Shortness of breath: Secondary | ICD-10-CM

## 2010-09-03 NOTE — Miscellaneous (Signed)
Summary: update med  Clinical Lists Changes  Medications: Added new medication of NITROSTAT 0.4 MG SUBL (NITROGLYCERIN) 1 tablet under tongue at onset of chest pain; you may repeat every 5 minutes for up to 3 doses. 

## 2010-09-03 NOTE — Progress Notes (Signed)
Summary: Nuclear Pre-Procedure  Phone Note Outgoing Call Call back at [phone] CELL   Call placed by: Stanton Kidney, EMT-P,  August 27, 2010 1:55 PM Action Taken: Phone Call Completed Summary of Call: Left message with information on Myoview Information Sheet (see scanned document for details). Stanton Kidney, EMT-P  August 27, 2010 1:55 PM      Nuclear Med Background Indications for Stress Test: Evaluation for Ischemia, Stent Patency, PTCA Patency  Indications Comments: 08/06/10 IWMI > VF > PEA > stents  History: Angioplasty, Echo, Heart Catheterization, Myocardial Infarction, Myocardial Perfusion Study, Stents  History Comments: 08/04/10 MPS: EF=49% 08/06/10 IWMI > angioplasty > RCA stents x 2 08/07/10 Echo: EF=55-60%  Symptoms: Chest Pain, Fatigue, Light-Headedness, Palpitations    Nuclear Pre-Procedure Cardiac Risk Factors: Carotid Disease, Family History - CAD, Hypertension, Lipids, Smoker Height (in): 66

## 2010-09-03 NOTE — Procedures (Signed)
[name], [name]             ACCOUNT NO.:  [id number]  MEDICAL RECORD NO.:  [id number]           PATIENT TYPE:  I  LOCATION:  2914                         FACILITY:  [facility]  PHYSICIAN:  Lael Pilch. Excell Seltzer, MD  DATE OF BIRTH:  1961-06-21  DATE OF PROCEDURE:  08/06/2010 DATE OF DISCHARGE:                           CARDIAC CATHETERIZATION   PROCEDURES: 1. Left heart catheterization. 2. Selective coronary angiography. 3. Left ventricular angiography. 4. Temporary transvenous pacemaker placement. 5. Percutaneous transluminal coronary angioplasty and stenting of the     right coronary artery. 6. CPR for resuscitation of ventricular fibrillation followed by     pulseless electrical activity.  PROCEDURAL INDICATIONS:  Mr. Boardley is a 50 year old who presented with an acute inferior wall infarction.  He has undergone previous stenting of the right coronary artery back in 2006 in [place].  He was having severe and ongoing chest pain on arrival and was taken directly to the Cardiac Cath Lab after transferring him from [facility]. Emergency consent was obtained.  The right groin was prepped, draped and anesthetized with 1% lidocaine. Using the modified Seldinger technique, a 6-French sheath was placed in the right femoral artery.  A JL-4 diagnostic catheter was inserted.  The left coronary artery was imaged.  There was collateralization of the distal right coronary artery.  The patient became very hypotensive and a second image of the left coronary artery demonstrated no distal flow and any of the vascular beds.  All branches of the both the LAD and the left circumflex had TIMI 0-1 flow.  The patient went into ventricular fibrillation and required defibrillation x1.  He had no pulse following defibrillation and he was treated with CPR.  He was oxygenated with a bag-valve-mask.  He was given 0.5 mg of epinephrine and he regained pressure, which we transduced from his femoral  sheath.  He also spontaneously woke up.  Anesthesia was present and we elected to intubate the patient because of his severe hemodynamic instability.  He also had marked bradycardia and we elected to place an emergency transvenous pacemaker.  Venous access was obtained with a 6-French sheath.  A balloon-tipped transvenous pacemaker was advanced to the RV apex and was set at a backup rate of 40.  The patient was intubated and mechanically ventilated, but just prior to that, I was able to reopen the right coronary artery.  A JR-4 guide was used.  The patient had already been given 60 mg of Effient.  He had arrived on a heparin drip and bivalirudin was used for anticoagulation.  A Cougar guidewire was advanced into the distal right coronary artery beyond the areas of severe stenosis.  There were critical lesions off both the proximal and distal edge of the previously placed stent.  The lesions were dilated with a 2.5 x 15-mm apex balloon, which was taken to 8 atmospheres on subsequent inflations.  Following reperfusion, the patient was electively intubated.  His ACT was drawn and it was 458 seconds.  I attempted to pass a 28-mm Promus stent, but it would not cross with the proximal lesion and a 2.5 x 12-mm Riverview Quantum apex balloon  was then advanced and multiple inflations were done up to 10 and 12 atmospheres. This allowed for passage of the stent.  There was a long segment that I thought would be treated.  A 3.0 x 28-mm stent was deployed at 12 atmospheres and appeared well expanded.  A second 3.0 x 28-mm Promus Element stent was deployed in overlapping fashion in the proximal right coronary artery.  Both stents appeared well expanded.  They were postdilated with a 3.25 x 20-mm Stevens Quantum apex balloon, which was taken to 16 atmospheres over multiple inflations so that the entire stented segment was postdilated.  The balloon catheter was removed and final angiography demonstrated an excellent  result with 0% residual stenosis and TIMI 3 flow.  Throughout the procedure, the patient was agitated. He was sedated with multiple doses of IV Versed as well as fentanyl.  We also started him on a propofol drip and gave him propofol boluses.  He eventually required a single dose of vecuronium as a paralytic. Critical Care Medicine was called immediately after the case to help with his sedation and ventilator management.  After the balloon catheter was removed, then the guide catheter was changed out for a pigtail catheter, it was advanced into the left ventricle.  Ventriculography was performed, a pullback across the aortic valve was done.  The patient was transferred to the CCU in critical condition.  PROCEDURAL FINDINGS:  Aortic pressure was 123/85 with a mean of 104. Left ventricular pressure was 102/15.  These pressures were obtained at different times.  There was no significant pullback gradient across the aortic valve.  CORONARY ANGIOGRAPHY:  The left mainstem is patent.  It divides into the LAD and left circumflex.  LAD:  The LAD has moderately severe diffuse disease throughout the proximal and midsegments.  There is a long segment 75% stenosis through the mid-LAD.  There is a large second diagonal branch and also has a 75% proximal stenosis.  The left circumflex has nonobstructive disease. There is 25-30% stenosis in the proximal circumflex.  The OM branches have mild diffuse stenosis of about 50%, but there are no areas of focal high-grade disease.  As noted above, the second image the left coronary artery shows no flow in any of the distal branch vessels and I think this was related to the patient's marked hemodynamic compromise and this angiographic image was performed.  Following the interventional procedure, the left coronary artery was re-imaged with intracoronary nitroglycerin and we were able to obtain good imaging with TIMI 3 flow in all vessels.  Right coronary  artery:  The right coronary artery has a stent in the midportion.  There is a severe 90% stenosis on the proximal edge of the stent.  There is a hazy filling defect on the distal edge of the stent and I suspect is the patient's culprit.  There is 99% stenosis present and TIMI 1 flow beyond that lesion.  The right coronary artery is dominant and supplies a PDA and posterolateral branch.  Left ventriculography shows basal and mid inferior wall akinesis.  The left ventricular ejection fraction is 45%.  There is no mitral regurgitation.  FINAL ASSESSMENT: 1. Subtotal occlusion of the mid-right coronary artery with successful     primary percutaneous coronary intervention using overlapping drug-     eluting stents. 2. Transient shock with ventricular fibrillation followed by pulseless     electrical activity, requiring CPR, epinephrine, and     defibrillation.  The patient transanally lost flow in  all of his     distal coronary arteries and I suspect this was due to his marked     hemodynamic compromise.  He was successfully resuscitated. 3. Moderately severe diffuse left anterior descending artery stenosis. 4. Mild-to-moderate left circumflex stenosis. 5. Mild segmental left ventricular systolic dysfunction.  RECOMMENDATIONS:  The patient will be carefully monitored in the CCU. The Critical Care Medicine team will manage his ventilator and sedation. They will carefully watch his hemodynamics and will continue his dual antiplatelet therapy.  We will have to review his films and decide on continued medical therapy versus revascularization for his LAD/diagonal disease.     [name]. Excell Seltzer, MD     MDC/MEDQ  D:  08/06/2010  T:  08/07/2010  Job:  [id number]  Electronically Signed by Tonny Bollman MD on 09/01/2010 08:57:02 PM
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
## 2010-09-03 NOTE — Letter (Signed)
Summary: Return To Work  [employer], Main Office  [address]   [place], [place] [address]   Phone: [phone]  Fax: [phone]    08/26/2010  TO: WHOM IT MAY CONCERN   RE: [name] [address] [id number]   The above named individual is under my medical care and may return to work on Monday, September 07, 2010 with no restrictions.   If you have any further questions or need additional information, please call.     Sincerely,    [name]. Excell Seltzer, MD Julieta Gutting, RN, BSN
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
## 2010-09-04 ENCOUNTER — Ambulatory Visit (INDEPENDENT_AMBULATORY_CARE_PROVIDER_SITE_OTHER): Payer: 59 | Admitting: Internal Medicine

## 2010-09-04 ENCOUNTER — Encounter: Payer: Self-pay | Admitting: Internal Medicine

## 2010-09-04 DIAGNOSIS — F329 Major depressive disorder, single episode, unspecified: Secondary | ICD-10-CM

## 2010-09-04 DIAGNOSIS — K219 Gastro-esophageal reflux disease without esophagitis: Secondary | ICD-10-CM | POA: Insufficient documentation

## 2010-09-04 DIAGNOSIS — I10 Essential (primary) hypertension: Secondary | ICD-10-CM

## 2010-09-04 DIAGNOSIS — F4322 Adjustment disorder with anxiety: Secondary | ICD-10-CM

## 2010-09-04 DIAGNOSIS — I2510 Atherosclerotic heart disease of native coronary artery without angina pectoris: Secondary | ICD-10-CM

## 2010-09-04 DIAGNOSIS — E785 Hyperlipidemia, unspecified: Secondary | ICD-10-CM

## 2010-09-04 LAB — CONVERTED CEMR LAB
Cholesterol, target level: 200 mg/dL
HDL goal, serum: 40 mg/dL
LDL Goal: 100 mg/dL

## 2010-09-07 ENCOUNTER — Telehealth: Payer: Self-pay | Admitting: Cardiovascular Disease

## 2010-09-08 NOTE — Assessment & Plan Note (Signed)
Summary: lexis only myoview due to caff./DX 412 [employer] not. #[id number]...  Nuclear Med Background Indications for Stress Test: Evaluation for Ischemia, Stent Patency, PTCA Patency  Indications Comments: 08/06/10 IWMI > VF > PEA > stents  History: Angioplasty, Echo, Heart Catheterization, Myocardial Infarction, Myocardial Perfusion Study, Stents  History Comments: 08/04/10 MPS: EF=49% 08/06/10 IWMI > angioplasty > RCA stents x 2 08/07/10 Echo: EF=55-60%  Symptoms: Chest Pain, Fatigue, Light-Headedness, Palpitations    Nuclear Pre-Procedure Cardiac Risk Factors: Carotid Disease, Family History - CAD, Hypertension, Lipids, Smoker Height (in): 66

## 2010-09-08 NOTE — Progress Notes (Signed)
Summary: Nuclear Pre-Procedure  Phone Note Outgoing Call Call back at [facility] Phone [phone] Call back at [phone]   Call placed by: Stanton Kidney, EMT-P,  September 01, 2010 3:16 PM Action Taken: Phone Call Completed Summary of Call: S/w male at home number, advised to call  the son's phone number at [phone]...Marland KitchenMarland KitchenLeft message with information on Myoview Information Sheet (see scanned document for details). Stanton Kidney, EMT-P  September 01, 2010 3:17 PM      Nuclear Med Background Indications for Stress Test: Evaluation for Ischemia, Stent Patency, PTCA Patency  Indications Comments: 08/06/10 IWMI > VF > PEA > stents  History: Angioplasty, Echo, Heart Catheterization, Myocardial Infarction, Myocardial Perfusion Study, Stents  History Comments: 08/04/10 MPS: EF=49% 08/06/10 IWMI > angioplasty > RCA stents x 2 08/07/10 Echo: EF=55-60%  Symptoms: Chest Pain, Fatigue, Light-Headedness, Palpitations    Nuclear Pre-Procedure Cardiac Risk Factors: Carotid Disease, Family History - CAD, Hypertension, Lipids, Smoker Height (in): 66

## 2010-09-08 NOTE — Assessment & Plan Note (Addendum)
Summary: Cardiology Nuclear Testing  Nuclear Med Background Indications for Stress Test: Evaluation for Ischemia, Stent Patency, PTCA Patency  Indications Comments: 08/06/10 IWMI > VF > PEA > stents  History: Angioplasty, Echo, Heart Catheterization, Myocardial Infarction, Myocardial Perfusion Study, Stents  History Comments: 08/04/10 MPS: EF=49% 08/06/10 IWMI > angioplasty > RCA stents x 2 08/07/10 Echo: EF=55-60%  Symptoms: Chest Pain, Fatigue, Light-Headedness, Palpitations    Nuclear Pre-Procedure Cardiac Risk Factors: Carotid Disease, Family History - CAD, Hypertension, Lipids, Smoker Caffeine/Decaff Intake: None NPO After: 6:00 PM IV 0.9% NS with Angio Cath: 20g     IV Site: R Antecubital IV Started by: Stanton Kidney, EMT-P Chest Size (in) 38     Height (in): 66 Weight (lb): 136 BMI: 22.03  Nuclear Med Study 1 or 2 day study:  1 day     Stress Test Type:  Treadmill/Lexiscan Reading MD:  Willa Rough, MD     Referring MD:  Dayle Points Resting Radionuclide:  Technetium 2m Tetrofosmin     Resting Radionuclide Dose:  33 mCi  Stress Radionuclide:  Technetium 15m Tetrofosmin     Stress Radionuclide Dose:  33 mCi   Stress Protocol  Max Systolic BP: 181 mm Hg Lexiscan: 0.4 mg   Stress Test Technologist:  Frederick Peers, EMT-P     Nuclear Technologist:  Doyne Keel, CNMT  Rest Procedure  Myocardial perfusion imaging was performed at rest 45 minutes following the intravenous administration of Technetium 84m Tetrofosmin.  Stress Procedure  The patient received IV Lexiscan 0.4 mg over 15-seconds with concurrent low level exercise and then Technetium 42m Tetrofosmin was injected at 30-seconds while the patient continued walking one more minute.  There were no significant changes with Lexiscan.  Quantitative spect images were obtained after a 45 minute delay.  QPS Raw Data Images:  Patient motion noted; appropriate software correction applied. Stress Images:  Mild decrease in activity  in the inferior wall and the apical cap. Rest Images:  mild decrease in activity in the inferior wall Subtraction (SDS):  Slight reversibility in the inferior wall and the apical cap. Transient Ischemic Dilatation:  0.89  (Normal <1.22)  Lung/Heart Ratio:  0.34  (Normal <0.45)  Quantitative Gated Spect Images QGS EDV:  100 ml QGS ESV:  51 ml QGS EF:  49 % QGS cine images:  Hypokinesis of the septum and base of the inferior wall.  Findings Abnormal      Overall Impression  Exercise Capacity: Lexiscan with no exercise. BP Response: Normal blood pressure response. Clinical Symptoms: Pinching sensation in left chest ECG Impression: No significant ST segment change suggestive of ischemia. Overall Impression Comments: The patient had pinching sensation in the chest. Because of the significant history, I reviewed all clinical data while he was here. There is no significant ischemia in the distribution of the LAD. There is mild scar with slight peri-infqrct ischemia in the inferior wall. There is slight reversibility at the apical cap of questionable significance.  Appended Document: Cardiology Nuclear Testing reviewed findings - no significant LAD territory ischemia. The RCA has been revascularized and shows only mild peri-infarct ischemia. Would continue medical therapy and cardiac rehab.  Appended Document: Cardiology Nuclear Testing Pt aware of results by phone.

## 2010-09-08 NOTE — Assessment & Plan Note (Signed)
Summary: lexiscan myoview dx 412/uhc/cooper/sl/[facility] NOTIFICATION#[id number]...  Nuclear Med Background Indications for Stress Test: Evaluation for Ischemia, Stent Patency, PTCA Patency  Indications Comments: 08/06/10 IWMI > VF > PEA > stents  History: Angioplasty, Echo, Heart Catheterization, Myocardial Infarction, Myocardial Perfusion Study, Stents  History Comments: 08/04/10 MPS: EF=49% 08/06/10 IWMI > angioplasty > RCA stents x 2 08/07/10 Echo: EF=55-60%  Symptoms: Chest Pain, Fatigue, Light-Headedness, Palpitations    Nuclear Pre-Procedure Cardiac Risk Factors: Carotid Disease, Family History - CAD, Hypertension, Lipids, Smoker Height (in): 66
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
## 2010-09-15 NOTE — Letter (Signed)
Summary: [facility] Notice  [facility] Notice   Imported By: Earl Many 09/04/2010 17:31:22  _____________________________________________________________________  External Attachment:    Type:   Image     Comment:   External Document

## 2010-09-15 NOTE — Progress Notes (Signed)
Summary: Myoview Results  Phone Note Call from Patient Call back at [phone]   Caller: Patient Reason for Call: Talk to Nurse, Talk to Doctor Summary of Call: pt rtn call to jennifer pt is not sure why jennifer called him Initial call taken by: Omer Jack,  September 07, 2010 8:51 AM  Follow-up for Phone Call        I spoke with the pt and made him aware of Myoview results.  The pt said he cannot get his meds through [facility] and would like a local Rx sent to [facility] for his cardiac meds.  Follow-up by: Julieta Gutting, RN, BSN,  September 07, 2010 11:11 AM    Prescriptions: NITROSTAT 0.4 MG SUBL (NITROGLYCERIN) 1 tablet under tongue at onset of chest pain; you may repeat every 5 minutes for up to 3 doses.  #25 x 2   Entered by:   Julieta Gutting, RN, BSN   Authorized by:   Norva Karvonen, MD   Signed by:   Julieta Gutting, RN, BSN on 09/07/2010   Method used:   Electronically to        [facility]  [phone]* (retail)       [address]       [place], [place]  [address]       Ph: [phone] or [phone]       Fax: [phone]   RxID:   [id number] EFFIENT 10 MG TABS (PRASUGREL HCL) Take one tablet by mouth daily  #30 x 11   Entered by:   Julieta Gutting, RN, BSN   Authorized by:   Norva Karvonen, MD   Signed by:   Julieta Gutting, RN, BSN on 09/07/2010   Method used:   Electronically to        [facility]  [phone]* (retail)       [address]       [place], [place]  [address]       Ph: [phone] or [phone]       Fax: [phone]   RxID:   [id number] METOPROLOL TARTRATE 25 MG TABS (METOPROLOL TARTRATE) Take 1 tablet by mouth two times a day  #60 x 11   Entered by:   Julieta Gutting, RN, BSN   Authorized by:   Norva Karvonen, MD   Signed by:   Julieta Gutting, RN, BSN on 09/07/2010   Method used:   Electronically to        [facility]  [phone]* (retail)       [address]       [place], [place]  [address]       Ph: [phone] or [phone]       Fax: [phone]   RxID:   [id number] CRESTOR 40 MG TABS (ROSUVASTATIN CALCIUM) Take 1 tablet by mouth once a day  #30 x 11   Entered by:   Julieta Gutting, RN, BSN   Authorized by:   Norva Karvonen, MD   Signed by:   Julieta Gutting, RN, BSN on 09/07/2010   Method used:   Electronically to        [facility]  [phone]* (retail)       [address]       [place], [place]  [address]  Ph: [phone] or [phone]       Fax: [phone]   RxID:   [id number]

## 2010-09-16 ENCOUNTER — Telehealth: Payer: Self-pay | Admitting: *Deleted

## 2010-09-16 NOTE — Telephone Encounter (Signed)
Pt just started rx for depression - zoloft 50mg  1 qd started 09/04/10. He feels that depression has gotten a little worse since starting med. Wants to know if he should give it more time?

## 2010-09-17 NOTE — Telephone Encounter (Signed)
Yes, give it some more time

## 2010-09-17 NOTE — Telephone Encounter (Signed)
Patient informed. 

## 2010-09-24 ENCOUNTER — Ambulatory Visit: Payer: 59 | Admitting: Psychology

## 2010-09-28 ENCOUNTER — Ambulatory Visit (INDEPENDENT_AMBULATORY_CARE_PROVIDER_SITE_OTHER): Payer: 59 | Admitting: Internal Medicine

## 2010-09-28 ENCOUNTER — Encounter: Payer: Self-pay | Admitting: Internal Medicine

## 2010-09-28 DIAGNOSIS — I10 Essential (primary) hypertension: Secondary | ICD-10-CM

## 2010-09-28 DIAGNOSIS — F4322 Adjustment disorder with anxiety: Secondary | ICD-10-CM

## 2010-09-28 DIAGNOSIS — F329 Major depressive disorder, single episode, unspecified: Secondary | ICD-10-CM

## 2010-09-28 MED ORDER — SERTRALINE HCL 100 MG PO TABS: 100.0000 mg | ORAL_TABLET | Freq: Every day | ORAL | Status: DC

## 2010-09-28 NOTE — Assessment & Plan Note (Signed)
I will increase the dose of sertraline and continue Klonopin, he will let me know if he has any new or worsening symptoms

## 2010-09-28 NOTE — Assessment & Plan Note (Signed)
His BP is well controlled 

## 2010-09-28 NOTE — Progress Notes (Signed)
  Subjective:    Patient ID: [name], male    DOB: [birth year], 50 y.o.   MRN: [id number]  HPI He returns for f/up and tells me that he still has s/s of depression and anxiety but he does get some relief with Klonopin, he is sleeping about 6-7 hours per night.   Review of Systems  Respiratory: Negative for apnea, cough, chest tightness, shortness of breath, wheezing and stridor.   Cardiovascular: Negative for chest pain, palpitations and leg swelling.  Gastrointestinal: Negative for nausea, abdominal pain, diarrhea and constipation.  Musculoskeletal: Negative for myalgias, back pain, arthralgias and gait problem.  Skin: Negative for color change, pallor and rash.  Neurological: Negative for dizziness, tremors, seizures, syncope, speech difficulty, weakness, light-headedness, numbness and headaches.  Psychiatric/Behavioral: Positive for dysphoric mood. Negative for suicidal ideas, hallucinations, behavioral problems, confusion, sleep disturbance, self-injury, decreased concentration and agitation. The patient is nervous/anxious. The patient is not hyperactive.        Objective:   Physical Exam  Constitutional: He is oriented to person, place, and time. He appears well-developed and well-nourished. No distress.  HENT:  Head: Normocephalic and atraumatic.  Right Ear: External ear normal.  Left Ear: External ear normal.  Nose: Nose normal.  Mouth/Throat: Oropharynx is clear and moist. No oropharyngeal exudate.  Eyes: Conjunctivae and EOM are normal. Pupils are equal, round, and reactive to light. Right eye exhibits no discharge. Left eye exhibits no discharge. No scleral icterus.  Neck: Normal range of motion. Neck supple. No thyromegaly present.  Cardiovascular: Normal rate, regular rhythm, normal heart sounds and intact distal pulses.  Exam reveals no gallop and no friction rub.   No murmur heard. Pulmonary/Chest: Effort normal and breath sounds normal. No respiratory distress. He  has no wheezes. He has no rales. He exhibits no tenderness.  Abdominal: Soft. Bowel sounds are normal. He exhibits no distension and no mass. There is no tenderness. There is no rebound and no guarding.  Musculoskeletal: Normal range of motion. He exhibits no edema and no tenderness.  Lymphadenopathy:    He has no cervical adenopathy.  Neurological: He is alert and oriented to person, place, and time. He has normal reflexes. He displays normal reflexes. No cranial nerve deficit. He exhibits normal muscle tone. Coordination normal.  Skin: Skin is warm and dry. No rash noted. He is not diaphoretic. No erythema. No pallor.  Psychiatric: He has a normal mood and affect. His behavior is normal. Judgment and thought content normal.          Assessment & Plan:

## 2010-09-28 NOTE — Patient Instructions (Signed)
Anxiety and Panic Attacks Your caregiver has informed you that you are having an anxiety or panic attack. There may be many forms of this. Most of the time these attacks come suddenly and without warning. They come at any time of day, including periods of sleep, and at any time of life. They may be strong and unexplained. Although panic attacks are very scary, they are physically harmless. Sometimes the cause of your anxiety is not known. Anxiety is a protective mechanism of the body in its fight or flight mechanism. Most of these perceived danger situations are actually nonphysical situations (such as anxiety over losing a job). CAUSES The causes of an anxiety or panic attack are many. Panic attacks may occur in otherwise healthy people given a certain set of circumstances. There may be a genetic cause for panic attacks. Some medications may also have anxiety as a side effect. SYMPTOMS Some of the most common feelings are:  Intense terror.  Dizziness, feeling faint.   Hot and cold flashes.   Fear of going crazy.   Feelings that nothing is real.   Sweating.   Shaking.   Chest pain or a fast heartbeat (palpitations).  Smothering, choking sensations.   Feelings of impending doom and that death is near.   Tingling of extremities, this may be from over breathing.   Altered reality (derealization).   Being detached from yourself (depersonalization).   Several symptoms can be present to make up anxiety or panic attacks. DIAGNOSIS The evaluation by your caregiver will depend on the type of symptoms you are experiencing. The diagnosis of anxiety or pain attack is made when no physical illness can be determined to be a cause of the symptoms. TREATMENT Treatment to prevent anxiety and panic attacks may include:  Avoidance of circumstances that cause anxiety.   Reassurance and relaxation.   Regular exercise.   Relaxation therapies, such as yoga.   Psychotherapy with a psychiatrist  or therapist.   Avoidance of caffeine, alcohol and illegal drugs.   Prescribed medication.  SEEK IMMEDIATE MEDICAL CARE IF:  You experience panic attack symptoms that are different than your usual symptoms.   You have any worsening or concerning symptoms.  Document Released: 06/14/2005 Document Re-Released: 12/02/2009 [employer] Patient Information 2011 [employer]. 

## 2010-09-28 NOTE — Assessment & Plan Note (Signed)
Increase sertraline dose and continue klonopin

## 2010-10-05 ENCOUNTER — Ambulatory Visit: Payer: 59 | Admitting: Internal Medicine

## 2010-10-06 ENCOUNTER — Other Ambulatory Visit (INDEPENDENT_AMBULATORY_CARE_PROVIDER_SITE_OTHER): Payer: 59 | Admitting: *Deleted

## 2010-10-06 DIAGNOSIS — I2119 ST elevation (STEMI) myocardial infarction involving other coronary artery of inferior wall: Secondary | ICD-10-CM

## 2010-10-06 DIAGNOSIS — R072 Precordial pain: Secondary | ICD-10-CM

## 2010-10-06 LAB — LIPID PANEL
Cholesterol: 189 mg/dL (ref 0–200)
HDL: 55.1 mg/dL (ref >39.00)
Triglycerides: 144 mg/dL (ref 0.0–149.0)
VLDL: 28.8 mg/dL (ref 0.0–40.0)

## 2010-10-06 LAB — HEPATIC FUNCTION PANEL
Albumin: 4.4 g/dL (ref 3.5–5.2)
Total Bilirubin: 0.6 mg/dL (ref 0.3–1.2)

## 2010-10-07 ENCOUNTER — Telehealth: Payer: Self-pay | Admitting: *Deleted

## 2010-10-07 DIAGNOSIS — F4322 Adjustment disorder with anxiety: Secondary | ICD-10-CM

## 2010-10-07 MED ORDER — CLONAZEPAM 2 MG PO TABS: 2.0000 mg | ORAL_TABLET | Freq: Two times a day (BID) | ORAL | Status: DC | PRN

## 2010-10-07 NOTE — Telephone Encounter (Signed)
Pt c/o severe anxiety and depression. He feels that the increase in sertraline has helped some. Klonopin has not helped with anxiety attacks and he states he is still having a hard time with "everything". Especially dealing with the anxiety attacks.

## 2010-10-07 NOTE — Telephone Encounter (Signed)
Therapy referral done

## 2010-10-07 NOTE — Telephone Encounter (Signed)
Pt advised of increase and will also contact Social Services.

## 2010-10-07 NOTE — Telephone Encounter (Signed)
Pt advised and states that he is unable to afford any additional expenses at this time and cannot pay for MD visits. Pt states that his wages are being garnishes due to hospital bills from recent heart attack and is again having trouble sleeping, stomach upset, decreased appetite and diarrhea and an increase of panic attacks x 1 week. Pt was advised to contact Social Services for information on free and low cost mental health services but is also requesting medication adjustment, please advise

## 2010-10-07 NOTE — Telephone Encounter (Signed)
Please call in the higher dose of klonopin

## 2010-10-08 ENCOUNTER — Encounter: Payer: Self-pay | Admitting: Cardiovascular Disease

## 2010-10-08 ENCOUNTER — Ambulatory Visit (INDEPENDENT_AMBULATORY_CARE_PROVIDER_SITE_OTHER): Payer: 59 | Admitting: Cardiovascular Disease

## 2010-10-08 DIAGNOSIS — E7800 Pure hypercholesterolemia, unspecified: Secondary | ICD-10-CM

## 2010-10-08 DIAGNOSIS — I2510 Atherosclerotic heart disease of native coronary artery without angina pectoris: Secondary | ICD-10-CM

## 2010-10-08 DIAGNOSIS — I252 Old myocardial infarction: Secondary | ICD-10-CM

## 2010-10-08 DIAGNOSIS — E785 Hyperlipidemia, unspecified: Secondary | ICD-10-CM

## 2010-10-08 DIAGNOSIS — I10 Essential (primary) hypertension: Secondary | ICD-10-CM

## 2010-10-08 MED ORDER — CLOPIDOGREL BISULFATE 75 MG PO TABS: 75.0000 mg | ORAL_TABLET | Freq: Every day | ORAL | Status: DC

## 2010-10-08 MED ORDER — PRAVASTATIN SODIUM 80 MG PO TABS: 80.0000 mg | ORAL_TABLET | Freq: Every day | ORAL | Status: DC

## 2010-10-08 NOTE — Patient Instructions (Signed)
Your physician has recommended you make the following change in your medication: STOP Crestor, START Pravastatin 80mg  take one by mouth at bedtime, Please finish Effient samples and then switch to Plavix.  You should take 150mg  (two 75mg  tablets) of Plavix the first two days of starting this medication and then decrease to 75mg  once a day.   Your physician wants you to follow-up in: 3 MONTHS.  You will receive a reminder letter in the mail two months in advance. If you don't receive a letter, please call our office to schedule the follow-up appointment.  Your physician recommends that you return for a FASTING lipid and liver profile in 3 MONTHS ( 412, 272.0)

## 2010-10-10 ENCOUNTER — Emergency Department: Payer: Self-pay | Admitting: Internal Medicine

## 2010-10-12 ENCOUNTER — Telehealth: Payer: Self-pay | Admitting: Cardiovascular Disease

## 2010-10-12 NOTE — Telephone Encounter (Signed)
I spoke with the pt and he said that when he went to the pharmacy they gave him a prescription for Crestor.  The pt has already picked up this medication.  I made the pt aware that we stopped this medication at his 10/08/10 OV due to cost and switched him to Pravastatin 80mg .  This prescription was sent into the pharmacy on 10/08/10. I contacted the pt's pharmacy and they said that the pt did pick up Pravastatin 80mg  on 10/11/10.  I called the pt back and he said the pharmacy gave him both Crestor and Pravastatin.  The pt said he was going to go back to the pharmacy to try and return the bottle of Crestor that he received in error.

## 2010-10-12 NOTE — Telephone Encounter (Signed)
Pt has question re his meds

## 2010-10-18 ENCOUNTER — Encounter: Payer: Self-pay | Admitting: Cardiovascular Disease

## 2010-10-18 NOTE — Assessment & Plan Note (Signed)
BP controlled on current meds.  
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
## 2010-10-18 NOTE — Assessment & Plan Note (Signed)
The patient's LDL is above goal at 105 mg/dL, but he has not been taking crestor because of limited resources. He will start pravastatin and will recheck lipids in 3 months.

## 2010-10-18 NOTE — Assessment & Plan Note (Signed)
I think the patient's recurrent symptoms are related to stress and anxiety. He is working with Dr Yetta Barre on treatment of depression. I have recommended switching his statin to pravastatin and changing from Effient to Plavix as it will become generic soon. This should help with some of his financial stresses. He otherwise will continue with his current medical program. We also discussed slow initiation of an exercise program as this may help with his general sense of well-being.

## 2010-10-19 ENCOUNTER — Emergency Department: Payer: Self-pay | Admitting: General Practice

## 2010-10-21 ENCOUNTER — Telehealth: Payer: Self-pay

## 2010-10-21 DIAGNOSIS — F4322 Adjustment disorder with anxiety: Secondary | ICD-10-CM

## 2010-10-21 MED ORDER — FLUOXETINE HCL 20 MG PO CAPS: 20.0000 mg | ORAL_CAPSULE | Freq: Every day | ORAL | Status: DC

## 2010-10-21 NOTE — Telephone Encounter (Signed)
Spoke to pt., he tells me he is not suicidal at this time, he contracts for safety and tells me that he is currently in [place], he wants to try a new and different antidepressant, will try fluoxetine

## 2010-10-21 NOTE — Telephone Encounter (Signed)
Patient requesting a call back from Dr Yetta Barre - wants to talk to MD about his medication.
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
## 2010-10-26 ENCOUNTER — Ambulatory Visit: Payer: 59 | Admitting: Internal Medicine

## 2010-11-04 ENCOUNTER — Telehealth: Payer: Self-pay | Admitting: Cardiovascular Disease

## 2010-11-04 NOTE — Telephone Encounter (Signed)
I spoke with the pt and for the past 3 days he has developed a pinching and tightness in his chest.  These symptoms are worse at night. The pt complains of his arms aching and hurting.  The pt said this morning he had a bad headache and jaw pain.  With these episodes the pt has been taking 2 NTG with relief.  The pt said that he has relief for about an hour and a half and then the symptoms return. The pt did have an episode this morning and took 2 NTG.  For the past 3 days the pt has had to take NTG at least 3 times per day.  I made the pt aware that he should go to the ER for evaluation of his symptoms.  At this time the pt is driving in [place] with his job.  The pt said he does not get off of work until 8 PM.  I made the pt aware that he should contact his employer and let them know that he has spoken with our office and we recommend that he go to the ER.  The pt agreed with plan but said that he may not be able to go to the ER until tonight.  I made the pt aware that he should go to the ER as soon as possible.

## 2010-11-04 NOTE — Telephone Encounter (Signed)
Pt calling re having some chest pains and wants to discuss with a nurse

## 2010-11-05 ENCOUNTER — Telehealth: Payer: Self-pay | Admitting: *Deleted

## 2010-11-05 ENCOUNTER — Telehealth: Payer: Self-pay | Admitting: Cardiovascular Disease

## 2010-11-05 DIAGNOSIS — E7800 Pure hypercholesterolemia, unspecified: Secondary | ICD-10-CM

## 2010-11-05 DIAGNOSIS — I252 Old myocardial infarction: Secondary | ICD-10-CM

## 2010-11-05 DIAGNOSIS — F4322 Adjustment disorder with anxiety: Secondary | ICD-10-CM

## 2010-11-05 MED ORDER — FLUOXETINE HCL 20 MG PO CAPS: 20.0000 mg | ORAL_CAPSULE | Freq: Every day | ORAL | Status: DC

## 2010-11-05 MED ORDER — PRAVASTATIN SODIUM 80 MG PO TABS: 80.0000 mg | ORAL_TABLET | Freq: Every day | ORAL | Status: DC

## 2010-11-05 MED ORDER — METOPROLOL TARTRATE 25 MG PO TABS: 25.0000 mg | ORAL_TABLET | Freq: Two times a day (BID) | ORAL | Status: DC

## 2010-11-05 MED ORDER — CLONAZEPAM 2 MG PO TABS: 2.0000 mg | ORAL_TABLET | Freq: Two times a day (BID) | ORAL | Status: DC | PRN

## 2010-11-05 MED ORDER — RANITIDINE HCL 300 MG PO TABS: 300.0000 mg | ORAL_TABLET | Freq: Every day | ORAL | Status: DC

## 2010-11-05 MED ORDER — CLOPIDOGREL BISULFATE 75 MG PO TABS: 75.0000 mg | ORAL_TABLET | Freq: Every day | ORAL | Status: DC

## 2010-11-05 NOTE — Telephone Encounter (Signed)
Patient requesting RX for 3 mth supply of [facility]ep[facility] He can not use any of his rfs b/c insurance now requires him to use mail order.

## 2010-11-05 NOTE — Telephone Encounter (Signed)
Pending signature - to be faxed tomorrow to [facility] Pt's ID# [phone] FAX # - [phone] - Pt aware

## 2010-11-05 NOTE — Telephone Encounter (Signed)
[facility] Will be faxing over refill requests. Pt wants nurse to call him.

## 2010-11-05 NOTE — Telephone Encounter (Addendum)
Left message for pt to call back.  This pt was also instructed to go to the ER for cardiac symptoms on 11/04/10 (telephone note).  Need to assess pt's symptoms when he calls back.

## 2010-11-05 NOTE — Telephone Encounter (Signed)
ok 

## 2010-11-09 ENCOUNTER — Telehealth: Payer: Self-pay

## 2010-11-09 NOTE — Telephone Encounter (Signed)
Pharmacy [facility]) called requesting a call back to clarify Clonazepam Rx for this pt. Per Corrie Dandy he states he was receiving 10mg  previously?

## 2010-11-09 NOTE — Telephone Encounter (Signed)
Spoke w/pharmacy - they pulled pt's DEA record, pt has been filling rx's at [facility] and [facility] for klonopin. 07/2010 rx #50 w/2 rfs from Dr Yetta Barre was brought to [facility] and RX written 09/04/10 #60 with 5 rfs was [facility]. I gave verbal to pharm to cancel rx for 1 mg w/the 5 rfs as pt is getting new rx from mail order.

## 2010-11-11 ENCOUNTER — Other Ambulatory Visit: Payer: Self-pay | Admitting: *Deleted

## 2010-11-11 DIAGNOSIS — I252 Old myocardial infarction: Secondary | ICD-10-CM

## 2010-11-11 DIAGNOSIS — E7800 Pure hypercholesterolemia, unspecified: Secondary | ICD-10-CM

## 2010-11-11 MED ORDER — METOPROLOL TARTRATE 25 MG PO TABS: 25.0000 mg | ORAL_TABLET | Freq: Two times a day (BID) | ORAL | Status: DC

## 2010-11-11 MED ORDER — PRAVASTATIN SODIUM 80 MG PO TABS: 80.0000 mg | ORAL_TABLET | Freq: Every day | ORAL | Status: DC

## 2010-11-13 ENCOUNTER — Other Ambulatory Visit: Payer: Self-pay | Admitting: *Deleted

## 2010-11-13 MED ORDER — FLUOXETINE HCL 20 MG PO CAPS: 20.0000 mg | ORAL_CAPSULE | Freq: Every day | ORAL | Status: DC

## 2010-11-19 NOTE — Telephone Encounter (Signed)
[facility] called and wanted a verbal order on Nitroglycerin 0.4mg .  Ref #[id number].  They will ship 3 bottles of 25 tablets and the pt has one additional refill available if needed.

## 2010-11-27 ENCOUNTER — Emergency Department: Payer: Self-pay | Admitting: Emergency Medicine

## 2010-11-28 ENCOUNTER — Ambulatory Visit (INDEPENDENT_AMBULATORY_CARE_PROVIDER_SITE_OTHER): Payer: 59 | Admitting: Family Medicine

## 2010-11-28 ENCOUNTER — Encounter: Payer: Self-pay | Admitting: Family Medicine

## 2010-11-28 VITALS — BP 120/80 | HR 88 | Temp 97.4°F | Wt 130.1 lb

## 2010-11-28 DIAGNOSIS — K591 Functional diarrhea: Secondary | ICD-10-CM

## 2010-11-28 DIAGNOSIS — R634 Abnormal weight loss: Secondary | ICD-10-CM

## 2010-11-28 MED ORDER — DIPHENOXYLATE-ATROPINE 2.5-0.025 MG PO TABS: 1.0000 | ORAL_TABLET | Freq: Four times a day (QID) | ORAL | Status: DC | PRN

## 2010-11-28 NOTE — Progress Notes (Signed)
  Subjective:    Patient ID: [name], male    DOB: [birth year], 50 y.o.   MRN: [id number]  HPI  50 yo WM presents for problems with nausea and diarrhea that worsened after his AMI 2 mos ago.  He had a cath with intervention 2 mos ago.  He has a hx of diarrhea before the AMI.  He is due for a colonoscopy.  He has had to take NTG for chest pain but also has had panic attacks.  He sees Dr  Copper  For cardiology care.  He has had 8-10 loose water BM's day and has noticed some bright red rectal bleeding.  Has nausea, with occasional vomitting, abd cramping no matter what he eats.  He is trying to stay hydrated.  Has had an increase in fecal incontinence over the past 2 mos and has lost 20 lbs.    BP 120/80  Pulse 88  Temp(Src) 97.4 F (36.3 C) (Oral)  Wt 130 lb 1.3 oz (59.004 kg)     Review of Systems  Constitutional: Positive for appetite change, fatigue and unexpected weight change. Negative for fever.  HENT: Negative for congestion and sore throat.   Respiratory: Negative for cough and shortness of breath.   Cardiovascular: Positive for chest pain. Negative for palpitations and leg swelling.  Gastrointestinal: Positive for nausea, vomiting, abdominal pain, diarrhea and blood in stool. Negative for constipation.  Genitourinary: Negative for difficulty urinating.  Neurological: Negative for dizziness, weakness and headaches.  Hematological: Bruises/bleeds easily.  Psychiatric/Behavioral: The patient is nervous/anxious.        Objective:   Physical Exam  Constitutional: He appears well-developed and well-nourished. No distress.  HENT:  Head: Normocephalic and atraumatic.       Abrasion over R cheek Oral mucosa pink and mildly dry  Eyes: No scleral icterus.  Neck: Neck supple. No thyromegaly present.  Cardiovascular: Normal rate, regular rhythm and normal heart sounds.   No murmur heard. Pulmonary/Chest: Effort normal and breath sounds normal. No respiratory distress. He has  no wheezes.  Abdominal: Soft. Bowel sounds are normal. He exhibits no distension and no mass. There is no tenderness. There is no guarding.  Musculoskeletal: He exhibits no edema.  Lymphadenopathy:    He has no cervical adenopathy.  Neurological: Coordination abnormal.  Skin: Skin is warm and dry.       Multiple bruises on extremities and abdomen  Psychiatric: He has a normal mood and affect.          Assessment & Plan:

## 2010-11-28 NOTE — Assessment & Plan Note (Signed)
Chronic diarrhea, worsened lately now with fecal incontince and wt loss.  Will update his labs and get him referred to GI for colonoscopy. RX for Lomotil given.  Encouraged hydration with water, bland diet and adding a daily probiotic.

## 2010-11-28 NOTE — Patient Instructions (Signed)
Use Lomotil up to 4 x a day for diarrhea.    Add [facility] OTC.  I will schedule you with GI.  Go downstairs for labs on Monday - CBC with diff, CMP and TSH dx: unintentional wt loss.  F/u with Dr Yetta Barre next wk.

## 2010-11-30 ENCOUNTER — Other Ambulatory Visit (INDEPENDENT_AMBULATORY_CARE_PROVIDER_SITE_OTHER): Payer: 59

## 2010-11-30 ENCOUNTER — Other Ambulatory Visit: Payer: Self-pay | Admitting: Cardiovascular Disease

## 2010-11-30 DIAGNOSIS — I252 Old myocardial infarction: Secondary | ICD-10-CM

## 2010-11-30 DIAGNOSIS — E7800 Pure hypercholesterolemia, unspecified: Secondary | ICD-10-CM

## 2010-11-30 LAB — HEPATIC FUNCTION PANEL
ALT: 28 U/L (ref 0–53)
Bilirubin, Direct: 0.1 mg/dL (ref 0.0–0.3)
Total Bilirubin: 0.4 mg/dL (ref 0.3–1.2)

## 2010-11-30 LAB — LIPID PANEL
HDL: 45.1 mg/dL (ref >39.00)
Total CHOL/HDL Ratio: 3

## 2010-12-01 ENCOUNTER — Emergency Department: Payer: Self-pay | Admitting: Internal Medicine

## 2010-12-09 ENCOUNTER — Encounter: Payer: Self-pay | Admitting: Gastroenterology

## 2010-12-16 ENCOUNTER — Emergency Department: Payer: Self-pay | Admitting: Internal Medicine

## 2010-12-16 ENCOUNTER — Ambulatory Visit (INDEPENDENT_AMBULATORY_CARE_PROVIDER_SITE_OTHER): Payer: 59 | Admitting: Internal Medicine

## 2010-12-16 ENCOUNTER — Other Ambulatory Visit (INDEPENDENT_AMBULATORY_CARE_PROVIDER_SITE_OTHER): Payer: 59

## 2010-12-16 ENCOUNTER — Encounter: Payer: Self-pay | Admitting: Internal Medicine

## 2010-12-16 VITALS — BP 132/92 | HR 60 | Temp 97.9°F | Ht 66.0 in | Wt 134.1 lb

## 2010-12-16 DIAGNOSIS — I10 Essential (primary) hypertension: Secondary | ICD-10-CM

## 2010-12-16 DIAGNOSIS — J019 Acute sinusitis, unspecified: Secondary | ICD-10-CM

## 2010-12-16 DIAGNOSIS — F419 Anxiety disorder, unspecified: Secondary | ICD-10-CM

## 2010-12-16 DIAGNOSIS — F411 Generalized anxiety disorder: Secondary | ICD-10-CM

## 2010-12-16 DIAGNOSIS — Z87442 Personal history of urinary calculi: Secondary | ICD-10-CM | POA: Insufficient documentation

## 2010-12-16 DIAGNOSIS — N453 Epididymo-orchitis: Secondary | ICD-10-CM | POA: Insufficient documentation

## 2010-12-16 LAB — URINALYSIS, ROUTINE W REFLEX MICROSCOPIC
Ketones, ur: NEGATIVE
Leukocytes, UA: NEGATIVE
Nitrite: NEGATIVE
Specific Gravity, Urine: 1.025 (ref 1.000–1.030)
Urobilinogen, UA: 0.2 (ref 0.0–1.0)
pH: 7.5 (ref 5.0–8.0)

## 2010-12-16 MED ORDER — CLONAZEPAM 2 MG PO TABS: 2.0000 mg | ORAL_TABLET | Freq: Two times a day (BID) | ORAL | Status: DC | PRN

## 2010-12-16 MED ORDER — LEVOFLOXACIN 500 MG PO TABS: 500.0000 mg | ORAL_TABLET | Freq: Every day | ORAL | Status: AC

## 2010-12-16 NOTE — Assessment & Plan Note (Signed)
stable overall by hx and exam, and pt to continue medical treatment as before  Refill done per pt request

## 2010-12-16 NOTE — Assessment & Plan Note (Signed)
stable overall by hx and exam, most recent data reviewed with pt, and pt to continue medical treatment as before BP Readings from Last 3 Encounters:  12/16/10 132/92  11/28/10 120/80  10/08/10 133/83
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
## 2010-12-16 NOTE — Progress Notes (Signed)
Quick Note:  Voice message left on [employer] system - lab is negative, normal or otherwise stable, pt to continue same tx ______ 

## 2010-12-16 NOTE — Assessment & Plan Note (Signed)
Right sided, mild to mod, for antibx course,  to f/u any worsening symptoms or concerns

## 2010-12-16 NOTE — Patient Instructions (Addendum)
Take all new medications as prescribed Continue all other medications as before, including the klonopin Please go to LAB in the Basement for the blood and/or urine tests to be done today Please call the phone number [phone] (the [employer] System) for results of testing in 2-3 days;  When calling, simply dial the number, and when prompted enter the MRN number above (the Medical Record Number) and the # key, then the message should start. Please stop smoking

## 2010-12-16 NOTE — Assessment & Plan Note (Signed)
Mild to mod, for antibx course,  to f/u any worsening symptoms or concerns 

## 2010-12-17 ENCOUNTER — Telehealth: Payer: Self-pay | Admitting: *Deleted

## 2010-12-17 NOTE — Telephone Encounter (Signed)
Pt's sister left VM - pt is currently on suicide watch. She is req a call back.

## 2010-12-17 NOTE — Telephone Encounter (Signed)
Spoke w/sister

## 2010-12-17 NOTE — Telephone Encounter (Signed)
Spoke w/sister - pt was at the ER, taken by police due to abnormal behavior. They refuse to give family any more information b/c pt signed form restricting access. She does know that pt is suicidal but is very worried. I explained there is a required hold of 48 to 72 hours if pt admits to being homicidal or suicidal so he most likely has been transferred to behavioral health department. She understands that we have no knowledge of info from [facility] and can not give her any other info b/c there is no HIPPA form on file from pt.

## 2010-12-18 LAB — URINE CULTURE: Organism ID, Bacteria: NO GROWTH

## 2010-12-18 NOTE — Progress Notes (Signed)
Quick Note:  Voice message left on [employer] system - lab is negative, normal or otherwise stable, pt to continue same tx ______ 

## 2010-12-27 HISTORY — PX: FACIAL FRACTURE SURGERY: SHX1570

## 2010-12-28 ENCOUNTER — Ambulatory Visit: Payer: 59 | Admitting: Internal Medicine

## 2010-12-28 ENCOUNTER — Telehealth: Payer: Self-pay | Admitting: *Deleted

## 2010-12-28 NOTE — Telephone Encounter (Signed)
Called pt regarding pre-op medical clearance form he dropped off in office today. Left message to call back.

## 2010-12-29 NOTE — Telephone Encounter (Signed)
Called and left message with the patient's son who answered the telephone. The patient is in [place] getting preoperative testing done at present. He will call back later today when he gets home.

## 2010-12-29 NOTE — Telephone Encounter (Signed)
I have now spoken with the patient as well as the surgeon. We discussed the possibility of continuing on either aspirin or Plavix but not both drugs. However, the bleeding risk is prohibitive with this type of surgery and bleeding in the orbit can lead to blindness. Therefore, the patient will have to hold both aspirin and Plavix.  After discussion with the surgeon, it is likely that [name] will be able to resume antiplatelet drugs the day following his surgery. This will result in a total interruption of aspirin and Plavix for only about 4 days. I think the only way to mitigate the risk of stent thrombosis and the situation is by interrupting these medications as short as can be done. Using Lovenox or Integrilin is probably unfavorable considering bleeding risk. This was all discussed in detail with the patient as well.

## 2010-12-29 NOTE — Telephone Encounter (Signed)
Paperwork completed by Dr. Excell Seltzer and faxed to surgeon's office.
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
## 2010-12-29 NOTE — Telephone Encounter (Signed)
Returning call back to speak with the [facility]

## 2010-12-29 NOTE — Telephone Encounter (Signed)
Per pt calling back .  

## 2011-01-04 ENCOUNTER — Ambulatory Visit (INDEPENDENT_AMBULATORY_CARE_PROVIDER_SITE_OTHER): Payer: 59 | Admitting: Internal Medicine

## 2011-01-04 ENCOUNTER — Encounter: Payer: Self-pay | Admitting: Internal Medicine

## 2011-01-04 VITALS — BP 136/80 | HR 63 | Temp 98.3°F | Resp 16 | Wt 143.5 lb

## 2011-01-04 DIAGNOSIS — S0280XA Fracture of other specified skull and facial bones, unspecified side, initial encounter for closed fracture: Secondary | ICD-10-CM

## 2011-01-04 DIAGNOSIS — S0285XA Fracture of orbit, unspecified, initial encounter for closed fracture: Secondary | ICD-10-CM

## 2011-01-04 DIAGNOSIS — F329 Major depressive disorder, single episode, unspecified: Secondary | ICD-10-CM

## 2011-01-04 DIAGNOSIS — I10 Essential (primary) hypertension: Secondary | ICD-10-CM
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
## 2011-01-04 NOTE — Assessment & Plan Note (Signed)
His BP is well controlled 

## 2011-01-04 NOTE — Assessment & Plan Note (Signed)
This appears to be healing nicely

## 2011-01-04 NOTE — Assessment & Plan Note (Signed)
He is doing well on his current regimen, he has mild anxiety but he is not depressed and is no longer suicidal

## 2011-01-13 ENCOUNTER — Encounter: Payer: Self-pay | Admitting: Gastroenterology

## 2011-01-18 ENCOUNTER — Ambulatory Visit: Payer: 59 | Admitting: Gastroenterology

## 2011-01-18 ENCOUNTER — Encounter: Payer: Self-pay | Admitting: Internal Medicine

## 2011-01-18 ENCOUNTER — Telehealth: Payer: Self-pay | Admitting: *Deleted

## 2011-01-18 ENCOUNTER — Ambulatory Visit (INDEPENDENT_AMBULATORY_CARE_PROVIDER_SITE_OTHER)
Admission: RE | Admit: 2011-01-18 | Discharge: 2011-01-18 | Disposition: A | Discharge status: Home / Self Care | Payer: 59 | Source: Ambulatory Visit | Attending: Internal Medicine | Admitting: Internal Medicine

## 2011-01-18 ENCOUNTER — Ambulatory Visit (INDEPENDENT_AMBULATORY_CARE_PROVIDER_SITE_OTHER): Payer: 59 | Admitting: Internal Medicine

## 2011-01-18 VITALS — BP 118/80 | HR 65 | Temp 97.8°F | Wt 142.0 lb

## 2011-01-18 DIAGNOSIS — F411 Generalized anxiety disorder: Secondary | ICD-10-CM

## 2011-01-18 DIAGNOSIS — S0280XA Fracture of other specified skull and facial bones, unspecified side, initial encounter for closed fracture: Secondary | ICD-10-CM

## 2011-01-18 DIAGNOSIS — F419 Anxiety disorder, unspecified: Secondary | ICD-10-CM

## 2011-01-18 DIAGNOSIS — I10 Essential (primary) hypertension: Secondary | ICD-10-CM

## 2011-01-18 DIAGNOSIS — S0285XA Fracture of orbit, unspecified, initial encounter for closed fracture: Secondary | ICD-10-CM

## 2011-01-18 DIAGNOSIS — S0990XA Unspecified injury of head, initial encounter: Secondary | ICD-10-CM

## 2011-01-18 DIAGNOSIS — R51 Headache: Secondary | ICD-10-CM

## 2011-01-18 DIAGNOSIS — R413 Other amnesia: Secondary | ICD-10-CM | POA: Insufficient documentation

## 2011-01-18 MED ORDER — PREGABALIN 50 MG PO CAPS: 50.0000 mg | ORAL_CAPSULE | Freq: Two times a day (BID) | ORAL | Status: DC

## 2011-01-18 NOTE — Assessment & Plan Note (Signed)
I will do a CT of his brain today to see if he has any signs of hemorrhage, etc

## 2011-01-18 NOTE — Assessment & Plan Note (Signed)
Will try lyrica for the anxiety

## 2011-01-18 NOTE — Telephone Encounter (Signed)
Left vm for pt

## 2011-01-18 NOTE — Assessment & Plan Note (Signed)
I will do a CT today to see if there are any complications, infections, etc.

## 2011-01-18 NOTE — Patient Instructions (Signed)
Headache, General, Without Cause The specific cause of your headache may not have been found today. There are many causes and types of headache. A few common ones are:  Tension headache.  Migraine.   Infections (examples: dental and sinus infections).  Bone and/or joint problems in the neck or jaw.   Depression.  Eye problems.   These headaches are not life threatening.  Headaches can sometimes be diagnosed by a patient history and a physical exam. Sometimes, lab and imaging studies (such as x-ray and/or CT scan) are used to rule out more serious problems. In some cases, a spinal tap (lumbar puncture) may be requested. There are many times when your exam and tests may be normal on the first visit even when there is a serious problem causing your headaches. Because of that, it is very important to follow up with your doctor or local clinic for further evaluation. HOME CARE INSTRUCTIONS  Keep follow-up appointments with your caregiver, or any specialist referral.   Only take over-the-counter or prescription medicines for pain, discomfort, or fever as directed by your caregiver.   Biofeedback, massage, or other relaxation techniques may be helpful.   Ice packs or heat applied to the head and neck can be used. Do this three to four times per day, or as needed.   Call your doctor if you have any questions or concerns.   If you smoke, you should quit.  FINDING OUT THE RESULTS OF TESTS  If a radiology test was performed, a radiologist will review your results.   You will be contacted by the emergency department or your physician if any test results require a change in your treatment plan.   Not all test results may be available during your visit. If your test results are not back during the visit, make an appointment with your caregiver to find out the results. Do not assume everything is normal if you have not heard from your caregiver or the medical facility. It is important for you to  follow up on all of your test results.  SEEK MEDICAL CARE IF:  You develop problems with medications prescribed.   You do not respond to or obtain relief from medications.   You have a change from the usual headache.   You develop nausea or vomiting.  SEEK IMMEDIATE MEDICAL CARE IF:   If your headache becomes severe.   You have an unexplained oral temperature above 100.5, or as your caregiver suggests.   You have a stiff neck.   You have loss of vision.   You have muscular weakness.   You have loss of muscular control.   You develop severe symptoms different from your first symptoms.   You start losing your balance or have trouble walking.   You feel faint or pass out.  MAKE SURE YOU:   Understand these instructions.   Will watch your condition.   Will get help right away if you are not doing well or get worse.  Document Released: 06/14/2005 Document Re-Released: 05/27/2008 Exit[employer] Patient Information 2011 [employer]. 
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
## 2011-01-18 NOTE — Telephone Encounter (Signed)
Patient requesting RX from MD to help with headache. He no longer is on klonopin and does not want to resume med.

## 2011-01-18 NOTE — Assessment & Plan Note (Signed)
He has persistent headache so will try lyrica for pain relief
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
## 2011-01-18 NOTE — Telephone Encounter (Signed)
I gave him samples of lyrica for the headaches

## 2011-01-18 NOTE — Assessment & Plan Note (Signed)
BP is well controlled 

## 2011-01-19 ENCOUNTER — Telehealth: Payer: Self-pay | Admitting: *Deleted

## 2011-01-19 NOTE — Telephone Encounter (Signed)
Patient requesting results of CT

## 2011-01-19 NOTE — Telephone Encounter (Signed)
done

## 2011-01-20 ENCOUNTER — Telehealth: Payer: Self-pay | Admitting: *Deleted

## 2011-01-20 DIAGNOSIS — R51 Headache: Secondary | ICD-10-CM

## 2011-01-20 DIAGNOSIS — R413 Other amnesia: Secondary | ICD-10-CM

## 2011-01-20 DIAGNOSIS — S0990XA Unspecified injury of head, initial encounter: Secondary | ICD-10-CM

## 2011-01-20 NOTE — Telephone Encounter (Signed)
Pt left vm returning MD's call. He is req to speak w/MD. Pt c/o "bad" headaches and nausea.

## 2011-01-21 ENCOUNTER — Telehealth: Payer: Self-pay | Admitting: *Deleted

## 2011-01-21 DIAGNOSIS — R51 Headache: Secondary | ICD-10-CM

## 2011-01-21 NOTE — Telephone Encounter (Signed)
Referral to Dr. Vela Prose

## 2011-01-21 NOTE — Telephone Encounter (Signed)
Patient requesting RX from MD for medication to help with severe headaches while waiting on referral.

## 2011-01-21 NOTE — Telephone Encounter (Signed)
Pt advised of referral via VM, pt told to expect a call from [facility] with appt info.

## 2011-01-22 ENCOUNTER — Telehealth: Payer: Self-pay | Admitting: Internal Medicine

## 2011-01-22 MED ORDER — TOPIRAMATE 50 MG PO TABS: 50.0000 mg | ORAL_TABLET | Freq: Every day | ORAL | Status: DC

## 2011-01-22 NOTE — Telephone Encounter (Signed)
Patient walk in request for his CT scan report to be faxed to [facility] to continue is benefits. Patient stated this needed to be faxed by Monday. Patient completed a [facility] medical release. Report faxed to [phone].

## 2011-01-22 NOTE — Telephone Encounter (Signed)
Patient informed. 

## 2011-01-22 NOTE — Telephone Encounter (Signed)
done

## 2011-01-25 ENCOUNTER — Telehealth: Payer: Self-pay

## 2011-01-25 ENCOUNTER — Emergency Department (HOSPITAL_COMMUNITY): Payer: 59

## 2011-01-25 ENCOUNTER — Emergency Department (HOSPITAL_COMMUNITY)
Admission: EM | Admit: 2011-01-25 | Discharge: 2011-01-25 | Disposition: A | Discharge status: Home / Self Care | Payer: 59 | Attending: Emergency Medicine | Admitting: Emergency Medicine

## 2011-01-25 DIAGNOSIS — R51 Headache: Secondary | ICD-10-CM | POA: Insufficient documentation

## 2011-01-25 DIAGNOSIS — Z79899 Other long term (current) drug therapy: Secondary | ICD-10-CM | POA: Insufficient documentation

## 2011-01-25 DIAGNOSIS — R112 Nausea with vomiting, unspecified: Secondary | ICD-10-CM | POA: Insufficient documentation

## 2011-01-25 DIAGNOSIS — I252 Old myocardial infarction: Secondary | ICD-10-CM | POA: Insufficient documentation

## 2011-01-25 DIAGNOSIS — R1013 Epigastric pain: Secondary | ICD-10-CM | POA: Insufficient documentation

## 2011-01-25 LAB — COMPREHENSIVE METABOLIC PANEL
ALT: 19 U/L (ref 0–53)
BUN: 13 mg/dL (ref 6–23)
CO2: 25 mEq/L (ref 19–32)
Calcium: 10 mg/dL (ref 8.4–10.5)
Creatinine, Ser: 1.01 mg/dL (ref 0.50–1.35)
GFR calc Af Amer: >60 mL/min (ref >60)
GFR calc non Af Amer: >60 mL/min (ref >60)
Glucose, Bld: 78 mg/dL (ref 70–99)
Sodium: 142 mEq/L (ref 135–145)
Total Protein: 6.7 g/dL (ref 6.0–8.3)

## 2011-01-25 LAB — CBC
HCT: 37.3 % — ABNORMAL LOW (ref 39.0–52.0)
MCV: 98.2 fL (ref 78.0–100.0)
Platelets: 175 10*3/uL (ref 150–400)
RBC: 3.8 MIL/uL — ABNORMAL LOW (ref 4.22–5.81)
RDW: 12.8 % (ref 11.5–15.5)
WBC: 5.1 10*3/uL (ref 4.0–10.5)

## 2011-01-25 LAB — ETHANOL: Alcohol, Ethyl (B): <11 mg/dL (ref 0–11)

## 2011-01-25 LAB — DIFFERENTIAL
Basophils Absolute: 0.1 10*3/uL (ref 0.0–0.1)
Eosinophils Relative: 7 % — ABNORMAL HIGH (ref 0–5)
Lymphocytes Relative: 43 % (ref 12–46)
Lymphs Abs: 2.2 10*3/uL (ref 0.7–4.0)
Neutrophils Relative %: 39 % — ABNORMAL LOW (ref 43–77)

## 2011-01-25 LAB — SALICYLATE LEVEL: Salicylate Lvl: <2 mg/dL — ABNORMAL LOW (ref 2.8–20.0)

## 2011-01-25 LAB — VALPROIC ACID LEVEL: Valproic Acid Lvl: 86.1 ug/mL (ref 50.0–100.0)

## 2011-01-25 LAB — AMMONIA: Ammonia: 67 umol/L — ABNORMAL HIGH (ref 11–60)

## 2011-01-25 LAB — GLUCOSE, CAPILLARY: Glucose-Capillary: 78 mg/dL (ref 70–99)
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
## 2011-01-27 ENCOUNTER — Encounter: Payer: Self-pay | Admitting: Internal Medicine

## 2011-01-27 ENCOUNTER — Ambulatory Visit (INDEPENDENT_AMBULATORY_CARE_PROVIDER_SITE_OTHER): Payer: 59 | Admitting: Internal Medicine

## 2011-01-27 ENCOUNTER — Ambulatory Visit: Payer: 59

## 2011-01-27 VITALS — BP 118/78 | HR 66 | Temp 98.7°F | Resp 16 | Wt 144.0 lb

## 2011-01-27 DIAGNOSIS — R059 Cough, unspecified: Secondary | ICD-10-CM

## 2011-01-27 DIAGNOSIS — I10 Essential (primary) hypertension: Secondary | ICD-10-CM

## 2011-01-27 DIAGNOSIS — R10816 Epigastric abdominal tenderness: Secondary | ICD-10-CM

## 2011-01-27 DIAGNOSIS — Z79899 Other long term (current) drug therapy: Secondary | ICD-10-CM

## 2011-01-27 DIAGNOSIS — R05 Cough: Secondary | ICD-10-CM

## 2011-01-27 DIAGNOSIS — R413 Other amnesia: Secondary | ICD-10-CM

## 2011-01-27 DIAGNOSIS — S0280XA Fracture of other specified skull and facial bones, unspecified side, initial encounter for closed fracture: Secondary | ICD-10-CM

## 2011-01-27 DIAGNOSIS — I252 Old myocardial infarction: Secondary | ICD-10-CM

## 2011-01-27 DIAGNOSIS — K920 Hematemesis: Secondary | ICD-10-CM

## 2011-01-27 DIAGNOSIS — S0990XA Unspecified injury of head, initial encounter: Secondary | ICD-10-CM

## 2011-01-27 DIAGNOSIS — R51 Headache: Secondary | ICD-10-CM

## 2011-01-27 DIAGNOSIS — R410 Disorientation, unspecified: Secondary | ICD-10-CM

## 2011-01-27 DIAGNOSIS — S0285XA Fracture of orbit, unspecified, initial encounter for closed fracture: Secondary | ICD-10-CM

## 2011-01-27 DIAGNOSIS — K219 Gastro-esophageal reflux disease without esophagitis: Secondary | ICD-10-CM

## 2011-01-27 DIAGNOSIS — E7800 Pure hypercholesterolemia, unspecified: Secondary | ICD-10-CM

## 2011-01-27 DIAGNOSIS — F29 Unspecified psychosis not due to a substance or known physiological condition: Secondary | ICD-10-CM

## 2011-01-27 LAB — CBC WITH DIFFERENTIAL/PLATELET
Eosinophils Relative: 3.3 % (ref 0.0–5.0)
HCT: 40.5 % (ref 39.0–52.0)
Hemoglobin: 13.8 g/dL (ref 13.0–17.0)
Lymphs Abs: 1.8 10*3/uL (ref 0.7–4.0)
MCV: 103 fl — ABNORMAL HIGH (ref 78.0–100.0)
Monocytes Absolute: 0.6 10*3/uL (ref 0.1–1.0)
Monocytes Relative: 6.4 % (ref 3.0–12.0)
Neutro Abs: 6.1 10*3/uL (ref 1.4–7.7)
RDW: 13 % (ref 11.5–14.6)
WBC: 8.8 10*3/uL (ref 4.5–10.5)

## 2011-01-27 MED ORDER — METOPROLOL TARTRATE 25 MG PO TABS: 25.0000 mg | ORAL_TABLET | Freq: Two times a day (BID) | ORAL | Status: DC

## 2011-01-27 MED ORDER — PROMETHAZINE HCL 25 MG PO TABS: 12.5000 mg | ORAL_TABLET | Freq: Four times a day (QID) | ORAL | Status: DC | PRN

## 2011-01-27 MED ORDER — DEXLANSOPRAZOLE 60 MG PO CPDR: 60.0000 mg | DELAYED_RELEASE_CAPSULE | Freq: Every day | ORAL | Status: AC

## 2011-01-27 MED ORDER — PRAVASTATIN SODIUM 80 MG PO TABS: 80.0000 mg | ORAL_TABLET | Freq: Every day | ORAL | Status: DC

## 2011-01-27 NOTE — Assessment & Plan Note (Signed)
This is a taking a big toll on him, I have asked him to see neurology soon

## 2011-01-27 NOTE — Assessment & Plan Note (Signed)
Stop lyrica and topamax, continue valproic acid and check labs today to look for vasculitis and other metabolic causes.
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
## 2011-01-27 NOTE — Assessment & Plan Note (Signed)
As discussed above 

## 2011-01-27 NOTE — Assessment & Plan Note (Addendum)
I will start a PPI for UGI protection and get a CBC done, he may need immediate intervention if he has lost a significant amount of blood, for now I have asked GI to see him on an urgent basis

## 2011-01-27 NOTE — Assessment & Plan Note (Signed)
I will check plain films of the abd to look for ileus, constipation, sbo, etc and will get labs done to look for uti, stones, organ pathology, and infection

## 2011-01-27 NOTE — Assessment & Plan Note (Signed)
Labs ordered.

## 2011-01-27 NOTE — Assessment & Plan Note (Signed)
This has healed.  

## 2011-01-27 NOTE — Patient Instructions (Signed)
Esophagitis (Heartburn) Esophagitis (heartburn) is a painful, burning sensation in the chest. It may feel worse in certain positions, such as lying down or bending over. It is caused by stomach acid backing up into the tube that carries food from the mouth down to the stomach (lower esophagus). TREATMENT There are a number of non-prescription medicines used to treat heartburn, including:  Antacids.   Acid reducers (also called H-2 blockers).   Proton-pump inhibitors.  HOME CARE INSTRUCTIONS  Raise the head of your bead by putting blocks under the legs.   Eat 2-3 hours before going to bed.   Stop smoking.   Try to reach and maintain a healthy weight.   Do not eat just a few very large meals. Instead, eat many smaller meals throughout the day.   Try to identify foods and beverages that make your symptoms worse, and avoid these.   Avoid tight clothing.   Do not exercise right after eating.  SEEK IMMEDIATE MEDICAL CARE IF YOU:  Have severe chest pain that goes down your arm, or into your jaw or neck.   Feel sweaty, dizzy, or lightheaded.   Are short of breath.   Throw up (vomit) blood.   Have difficulty or pain with swallowing.   Have bloody or black, tarry stools.   Have bouts of heartburn more than three times a week for more than two weeks.  Document Released: 07/22/2004 Document Re-Released: 09/08/2009 [employer] Patient Information 2011 [employer]. 

## 2011-01-27 NOTE — Assessment & Plan Note (Signed)
Change zantac to dexilant

## 2011-01-27 NOTE — Progress Notes (Signed)
Subjective:    Patient ID: [name], male    DOB: [birth year], 50 y.o.   MRN: [id number]  HPI He returns for f/up and tells me that he vomited some blood about one week ago after he had some N/V but he has not vomited any blood since then.  He had taken some AdvilPM for insomnia when this was occurring. He is doing poorly wrt to his memory and he continues to have daily headaches. He is with his son and his sister today and they are concerned that he is forgetful and that he acts "drugged" sometimes. He has been living with his son. They are aware that he is drinking about 2-3 beers per day. He did not get any symptom relief from Lyrica and he is not willing to try the Topamax for the headaches. The Valproic acid has helped to stabilize his mood.   Review of Systems  Constitutional: Positive for fatigue. Negative for fever, chills, diaphoresis, activity change, appetite change and unexpected weight change.  HENT: Negative for sore throat, facial swelling, trouble swallowing, neck pain, neck stiffness and voice change.   Eyes: Negative for photophobia, pain, discharge, redness, itching and visual disturbance.  Respiratory: Positive for cough. Negative for apnea, choking, chest tightness, shortness of breath, wheezing and stridor.   Cardiovascular: Negative for chest pain, palpitations and leg swelling.  Gastrointestinal: Positive for nausea, vomiting, abdominal pain and constipation. Negative for diarrhea, blood in stool, abdominal distention, anal bleeding and rectal pain.  Genitourinary: Negative for dysuria, urgency, frequency, hematuria, flank pain, decreased urine volume, discharge, penile swelling, scrotal swelling, enuresis, difficulty urinating, genital sores, penile pain and testicular pain.  Musculoskeletal: Negative for myalgias, back pain, joint swelling, arthralgias and gait problem.  Skin: Negative for color change, pallor, rash and wound.  Neurological: Negative for dizziness,  tremors, seizures, syncope, facial asymmetry, speech difficulty, weakness, light-headedness, numbness and headaches.  Hematological: Negative for adenopathy. Does not bruise/bleed easily.  Psychiatric/Behavioral: Positive for behavioral problems, confusion, sleep disturbance, dysphoric mood and decreased concentration. Negative for suicidal ideas, hallucinations, self-injury and agitation. The patient is nervous/anxious. The patient is not hyperactive.        Objective:   Physical Exam  Vitals reviewed. Constitutional: He is oriented to person, place, and time. He appears well-developed and well-nourished. No distress.  HENT:  Head: Normocephalic and atraumatic.  Right Ear: External ear normal.  Left Ear: External ear normal.  Nose: Nose normal.  Mouth/Throat: Oropharynx is clear and moist. No oropharyngeal exudate.  Eyes: Conjunctivae and EOM are normal. Pupils are equal, round, and reactive to light. Right eye exhibits no discharge. Left eye exhibits no discharge. No scleral icterus.  Neck: Normal range of motion. Neck supple. No JVD present. No tracheal deviation present. No thyromegaly present.  Cardiovascular: Normal rate, regular rhythm, normal heart sounds and intact distal pulses.  Exam reveals no gallop and no friction rub.   No murmur heard. Pulmonary/Chest: Effort normal and breath sounds normal. No stridor. No respiratory distress. He has no wheezes. He has no rales. He exhibits no tenderness.  Abdominal: Soft. Bowel sounds are normal. He exhibits no shifting dullness, no distension, no abdominal bruit, no ascites, no pulsatile midline mass and no mass. There is no hepatosplenomegaly, splenomegaly or hepatomegaly. There is generalized tenderness (throughout his entire abdomen). There is no rigidity, no rebound, no guarding, no CVA tenderness, no tenderness at McBurney's point and negative Murphy's sign. No hernia. Hernia confirmed negative in the ventral area, confirmed negative in  the right inguinal area and confirmed negative in the left inguinal area.  Genitourinary: Prostate normal, testes normal and penis normal. Rectal exam shows no external hemorrhoid, no internal hemorrhoid, no fissure, no mass, no tenderness and anal tone normal. Guaiac positive stool. Prostate is not enlarged and not tender. Right testis shows no mass, no swelling and no tenderness. Right testis is descended. Cremasteric reflex is not absent on the right side. Left testis shows no mass, no swelling and no tenderness. Left testis is descended. Cremasteric reflex is not absent on the left side. Circumcised. No penile erythema or penile tenderness. No discharge found.  Musculoskeletal: Normal range of motion. He exhibits no edema and no tenderness.  Lymphadenopathy:    He has no cervical adenopathy.       Right: No inguinal adenopathy present.       Left: No inguinal adenopathy present.  Neurological: He is alert and oriented to person, place, and time. He has normal strength. He is not disoriented. He displays no atrophy, no tremor and normal reflexes. No cranial nerve deficit or sensory deficit. He exhibits normal muscle tone. He displays a negative Romberg sign. He displays no seizure activity. Coordination and gait normal. He displays no Babinski's sign on the right side. He displays no Babinski's sign on the left side.  Reflex Scores:      Tricep reflexes are 1+ on the right side and 1+ on the left side.      Bicep reflexes are 1+ on the right side and 1+ on the left side.      Brachioradialis reflexes are 1+ on the right side and 1+ on the left side.      Patellar reflexes are 1+ on the right side and 1+ on the left side.      Achilles reflexes are 1+ on the right side and 1+ on the left side. Skin: Skin is warm and dry. No rash noted. He is not diaphoretic. No erythema. No pallor.  Psychiatric: He has a normal mood and affect. His behavior is normal. Judgment and thought content normal.           Assessment & Plan:

## 2011-01-27 NOTE — Assessment & Plan Note (Signed)
Will check a cxr to look for pna

## 2011-01-28 ENCOUNTER — Telehealth: Payer: Self-pay | Admitting: *Deleted

## 2011-01-28 LAB — COMPREHENSIVE METABOLIC PANEL
ALT: 25 U/L (ref 0–53)
Alkaline Phosphatase: 77 U/L (ref 39–117)
CO2: 26 mEq/L (ref 19–32)
Creatinine, Ser: 1 mg/dL (ref 0.4–1.5)
GFR: 82.43 mL/min (ref >60.00)
Sodium: 145 mEq/L (ref 135–145)
Total Bilirubin: 0.5 mg/dL (ref 0.3–1.2)
Total Protein: 7.4 g/dL (ref 6.0–8.3)

## 2011-01-28 LAB — VALPROIC ACID LEVEL: Valproic Acid Lvl: 102.6 ug/mL — ABNORMAL HIGH (ref 50.0–100.0)

## 2011-01-28 LAB — DRUGS OF ABUSE SCREEN W/O ALC, ROUTINE URINE
Barbiturate Quant, Ur: NEGATIVE
Cocaine Metabolites: NEGATIVE
Creatinine,U: 111.8 mg/dL
Methadone: NEGATIVE
Opiate Screen, Urine: NEGATIVE

## 2011-01-28 LAB — HIV ANTIBODY (ROUTINE TESTING W REFLEX): HIV: NONREACTIVE

## 2011-01-28 NOTE — Telephone Encounter (Signed)
Probably not related, let me know if it worsens or if new symptoms develop

## 2011-01-28 NOTE — Telephone Encounter (Signed)
Pt's sister states that patient is experiencing muscle soreness in his arms & legs and that the 'warnings' on the Risperidone say to call your doctor if this happens. Please advise.

## 2011-01-28 NOTE — Telephone Encounter (Signed)
Spoke with Sister - explained below, also informed her that lab results were not ready yet. She will bring pt back to lab b/c they need more urine to do u/a. Explained she may want to look into healthcare POA as she was inquiring about [employer] release forms.

## 2011-01-29 ENCOUNTER — Ambulatory Visit: Payer: 59 | Admitting: Internal Medicine

## 2011-01-29 LAB — URINALYSIS, ROUTINE W REFLEX MICROSCOPIC
Bilirubin Urine: NEGATIVE
Ketones, ur: 15
Specific Gravity, Urine: 1.02 (ref 1.000–1.030)
Urine Glucose: NEGATIVE
pH: 7 (ref 5.0–8.0)

## 2011-02-01 ENCOUNTER — Ambulatory Visit (INDEPENDENT_AMBULATORY_CARE_PROVIDER_SITE_OTHER): Payer: 59 | Admitting: Internal Medicine

## 2011-02-01 ENCOUNTER — Telehealth: Payer: Self-pay | Admitting: *Deleted

## 2011-02-01 ENCOUNTER — Other Ambulatory Visit (INDEPENDENT_AMBULATORY_CARE_PROVIDER_SITE_OTHER): Payer: 59

## 2011-02-01 ENCOUNTER — Encounter: Payer: Self-pay | Admitting: Internal Medicine

## 2011-02-01 DIAGNOSIS — F3289 Other specified depressive episodes: Secondary | ICD-10-CM

## 2011-02-01 DIAGNOSIS — R300 Dysuria: Secondary | ICD-10-CM

## 2011-02-01 DIAGNOSIS — K920 Hematemesis: Secondary | ICD-10-CM

## 2011-02-01 DIAGNOSIS — F329 Major depressive disorder, single episode, unspecified: Secondary | ICD-10-CM

## 2011-02-01 DIAGNOSIS — F29 Unspecified psychosis not due to a substance or known physiological condition: Secondary | ICD-10-CM

## 2011-02-01 DIAGNOSIS — I10 Essential (primary) hypertension: Secondary | ICD-10-CM

## 2011-02-01 DIAGNOSIS — S0990XA Unspecified injury of head, initial encounter: Secondary | ICD-10-CM

## 2011-02-01 DIAGNOSIS — R410 Disorientation, unspecified: Secondary | ICD-10-CM

## 2011-02-01 DIAGNOSIS — K5900 Constipation, unspecified: Secondary | ICD-10-CM | POA: Insufficient documentation

## 2011-02-01 LAB — URINALYSIS, ROUTINE W REFLEX MICROSCOPIC
Bilirubin Urine: NEGATIVE
Hgb urine dipstick: NEGATIVE
Leukocytes, UA: NEGATIVE
Nitrite: NEGATIVE
Total Protein, Urine: NEGATIVE
Urobilinogen, UA: 0.2 (ref 0.0–1.0)

## 2011-02-01 MED ORDER — POLYETHYLENE GLYCOL 3350 17 GM/SCOOP PO POWD: 17.0000 g | Freq: Every day | ORAL | Status: AC

## 2011-02-01 NOTE — Telephone Encounter (Signed)
Caller states that patient has not had a BM since Friday and that when he did it was "dark with a foul odor"; caller also states that Pt has not urinated since Saturday and when had did it "was foul smelling and burned". Also requesting lab results.

## 2011-02-01 NOTE — Patient Instructions (Signed)

## 2011-02-01 NOTE — Telephone Encounter (Signed)
He needs to be seen

## 2011-02-01 NOTE — Telephone Encounter (Signed)
Scheduled today at 4:00 pm.

## 2011-02-02 ENCOUNTER — Encounter: Payer: Self-pay | Admitting: Internal Medicine

## 2011-02-02 LAB — BENZODIAZEPINE, QUANTITATIVE, URINE
Alprazolam (GC/LC/MS), ur confirm: NEGATIVE NG/ML
Flurazepam Metabolite: NEGATIVE NG/ML
Lorazepam: NEGATIVE NG/ML
Temazapam: NEGATIVE NG/ML

## 2011-02-02 NOTE — Progress Notes (Signed)
Subjective:    Patient ID: [name], male    DOB: [birth year], 50 y.o.   MRN: [id number]  Constipation This is a recurrent problem. The current episode started more than 1 month ago. The problem is unchanged. His stool frequency is 2 to 3 times per week. The stool is described as firm and malodorous. The patient is not on a high fiber diet. He does not exercise regularly. There has not been adequate water intake. Pertinent negatives include no abdominal pain, anorexia, back pain, bloating, diarrhea, difficulty urinating, fecal incontinence, fever, flatus, hematochezia, hemorrhoids, melena, nausea, rectal pain, vomiting or weight loss. Risk factors include change in medication usage/dosage. He has tried nothing for the symptoms.      Review of Systems  Constitutional: Negative for fever, chills, weight loss, diaphoresis, activity change, appetite change, fatigue and unexpected weight change.  HENT: Negative for nosebleeds, sore throat, facial swelling, trouble swallowing, neck pain, neck stiffness and voice change.   Eyes: Negative for photophobia, redness and visual disturbance.  Respiratory: Negative for apnea, cough, choking, chest tightness, shortness of breath, wheezing and stridor.   Gastrointestinal: Positive for constipation. Negative for nausea, vomiting, abdominal pain, diarrhea, melena, hematochezia, rectal pain, bloating, anorexia, flatus and hemorrhoids.  Genitourinary: Positive for dysuria. Negative for urgency, frequency, hematuria, flank pain, decreased urine volume, discharge, penile swelling, scrotal swelling, enuresis, difficulty urinating, genital sores, penile pain and testicular pain.  Musculoskeletal: Negative for myalgias, back pain, joint swelling, arthralgias and gait problem.  Skin: Negative for color change, pallor, rash and wound.  Neurological: Negative for dizziness, tremors, seizures, syncope, facial asymmetry, speech difficulty, weakness, light-headedness,  numbness and headaches.  Hematological: Negative for adenopathy. Does not bruise/bleed easily.  Psychiatric/Behavioral: Positive for confusion, dysphoric mood and decreased concentration. Negative for suicidal ideas, hallucinations, behavioral problems, sleep disturbance, self-injury and agitation. The patient is nervous/anxious. The patient is not hyperactive.        Objective:   Physical Exam  Vitals reviewed. Constitutional: He is oriented to person, place, and time. He appears well-developed and well-nourished. No distress.  HENT:  Head: Normocephalic and atraumatic.  Right Ear: External ear normal.  Left Ear: External ear normal.  Nose: Nose normal.  Mouth/Throat: Oropharynx is clear and moist. No oropharyngeal exudate.  Eyes: Conjunctivae and EOM are normal. Pupils are equal, round, and reactive to light. Right eye exhibits no discharge. Left eye exhibits no discharge. No scleral icterus.  Neck: Normal range of motion. Neck supple. No JVD present. No tracheal deviation present. No thyromegaly present.  Cardiovascular: Normal rate, regular rhythm, normal heart sounds and intact distal pulses.  Exam reveals no gallop and no friction rub.   No murmur heard. Pulmonary/Chest: Effort normal and breath sounds normal. No stridor. No respiratory distress. He has no wheezes. He has no rales. He exhibits no tenderness.  Abdominal: Soft. Bowel sounds are normal. He exhibits no distension and no mass. There is no tenderness. There is no rebound and no guarding. Hernia confirmed negative in the right inguinal area and confirmed negative in the left inguinal area.  Genitourinary: Rectum normal, prostate normal, testes normal and penis normal. Rectal exam shows no external hemorrhoid, no internal hemorrhoid, no fissure, no mass, no tenderness and anal tone normal. Guaiac negative stool. Prostate is not enlarged and not tender. Right testis shows no mass, no swelling and no tenderness. Right testis is  descended. Cremasteric reflex is not absent on the right side. Left testis shows no mass, no swelling and no tenderness. Left  testis is descended. Cremasteric reflex is not absent on the left side. Circumcised. No penile erythema or penile tenderness. No discharge found.  Musculoskeletal: Normal range of motion. He exhibits no edema and no tenderness.  Lymphadenopathy:    He has no cervical adenopathy.       Right: No inguinal adenopathy present.       Left: No inguinal adenopathy present.  Neurological: He is alert and oriented to person, place, and time. He has normal reflexes. He displays normal reflexes. No cranial nerve deficit. He exhibits normal muscle tone. Coordination normal.  Skin: Skin is warm and dry. No rash noted. He is not diaphoretic. No erythema. No pallor.  Psychiatric: He has a normal mood and affect. His behavior is normal. Judgment and thought content normal.          Assessment & Plan:

## 2011-02-02 NOTE — Assessment & Plan Note (Addendum)
Will try miralax prn

## 2011-02-02 NOTE — Assessment & Plan Note (Signed)
I discussed with him his pos UDS and he tells me that he is NOT taking any benzo's and that if his screen for [facility] is pos. That it must be a "contact" exposure from his son's THC abuse, I also asked him to stop drinking alcohol as it is affecting his blood work

## 2011-02-02 NOTE — Assessment & Plan Note (Signed)
I will check his urine for RBC's and infection

## 2011-02-04 ENCOUNTER — Encounter: Payer: Self-pay | Admitting: Internal Medicine

## 2011-02-04 NOTE — Assessment & Plan Note (Signed)
His BP is well controlled 

## 2011-02-04 NOTE — Assessment & Plan Note (Signed)
This has improved on the PPI

## 2011-02-04 NOTE — Assessment & Plan Note (Signed)
I think his UDS and lab work shows some explanation of this (THC, Benzos, and EtOH)

## 2011-02-04 NOTE — Assessment & Plan Note (Signed)
This appears to be unchanged

## 2011-02-10 ENCOUNTER — Ambulatory Visit (INDEPENDENT_AMBULATORY_CARE_PROVIDER_SITE_OTHER): Payer: 59 | Admitting: Internal Medicine

## 2011-02-10 ENCOUNTER — Encounter: Payer: Self-pay | Admitting: Internal Medicine

## 2011-02-10 VITALS — BP 130/78 | HR 57 | Temp 97.5°F | Resp 16

## 2011-02-10 DIAGNOSIS — R410 Disorientation, unspecified: Secondary | ICD-10-CM

## 2011-02-10 DIAGNOSIS — F411 Generalized anxiety disorder: Secondary | ICD-10-CM

## 2011-02-10 DIAGNOSIS — F29 Unspecified psychosis not due to a substance or known physiological condition: Secondary | ICD-10-CM

## 2011-02-10 DIAGNOSIS — I10 Essential (primary) hypertension: Secondary | ICD-10-CM

## 2011-02-10 DIAGNOSIS — F419 Anxiety disorder, unspecified: Secondary | ICD-10-CM

## 2011-02-10 MED ORDER — CLONAZEPAM 1 MG PO TABS: 1.0000 mg | ORAL_TABLET | Freq: Three times a day (TID) | ORAL | Status: DC

## 2011-02-10 MED ORDER — RISPERIDONE 3 MG PO TABS: 3.0000 mg | ORAL_TABLET | Freq: Every day | ORAL | Status: DC

## 2011-02-10 MED ORDER — METOPROLOL TARTRATE 100 MG PO TABS: 100.0000 mg | ORAL_TABLET | Freq: Every day | ORAL | Status: DC

## 2011-02-10 NOTE — Patient Instructions (Signed)
Anxiety and Panic Attacks Your caregiver has informed you that you are having an anxiety or panic attack. There may be many forms of this. Most of the time these attacks come suddenly and without warning. They come at any time of day, including periods of sleep, and at any time of life. They may be strong and unexplained. Although panic attacks are very scary, they are physically harmless. Sometimes the cause of your anxiety is not known. Anxiety is a protective mechanism of the body in its fight or flight mechanism. Most of these perceived danger situations are actually nonphysical situations (such as anxiety over losing a job). CAUSES The causes of an anxiety or panic attack are many. Panic attacks may occur in otherwise healthy people given a certain set of circumstances. There may be a genetic cause for panic attacks. Some medications may also have anxiety as a side effect. SYMPTOMS Some of the most common feelings are:  Intense terror.  Dizziness, feeling faint.   Hot and cold flashes.   Fear of going crazy.   Feelings that nothing is real.   Sweating.   Shaking.   Chest pain or a fast heartbeat (palpitations).  Smothering, choking sensations.   Feelings of impending doom and that death is near.   Tingling of extremities, this may be from over breathing.   Altered reality (derealization).   Being detached from yourself (depersonalization).   Several symptoms can be present to make up anxiety or panic attacks. DIAGNOSIS The evaluation by your caregiver will depend on the type of symptoms you are experiencing. The diagnosis of anxiety or pain attack is made when no physical illness can be determined to be a cause of the symptoms. TREATMENT Treatment to prevent anxiety and panic attacks may include:  Avoidance of circumstances that cause anxiety.   Reassurance and relaxation.   Regular exercise.   Relaxation therapies, such as yoga.   Psychotherapy with a psychiatrist  or therapist.   Avoidance of caffeine, alcohol and illegal drugs.   Prescribed medication.  SEEK IMMEDIATE MEDICAL CARE IF:  You experience panic attack symptoms that are different than your usual symptoms.   You have any worsening or concerning symptoms.  Document Released: 06/14/2005 Document Re-Released: 12/02/2009 [employer] Patient Information 2011 [employer]. 

## 2011-02-15 ENCOUNTER — Encounter: Payer: Self-pay | Admitting: Internal Medicine

## 2011-02-15 ENCOUNTER — Telehealth: Payer: Self-pay | Admitting: *Deleted

## 2011-02-15 NOTE — Assessment & Plan Note (Signed)
This has improved.

## 2011-02-15 NOTE — Assessment & Plan Note (Signed)
His BP is well controlled 

## 2011-02-15 NOTE — Telephone Encounter (Signed)
Per Dr. Yetta Barre, pt should return to work today.  This is relayed to [place] @ pt's work.

## 2011-02-15 NOTE — Progress Notes (Signed)
  Subjective:    Patient ID: [name], male    DOB: 11/08/1960, 50 y.o.   MRN: [id number]  HPI HE returns for f/up and he tells me that he has anxiety and panic attacks and he wants to restart taking klonopin.   Review of Systems  Constitutional: Negative for fever, chills, diaphoresis, activity change, appetite change, fatigue and unexpected weight change.  HENT: Negative for sore throat, facial swelling, trouble swallowing, neck pain, neck stiffness and voice change.   Eyes: Negative for photophobia, pain, discharge, redness, itching and visual disturbance.  Respiratory: Negative for apnea, cough, choking, chest tightness, shortness of breath, wheezing and stridor.   Cardiovascular: Negative for chest pain, palpitations and leg swelling.  Gastrointestinal: Positive for constipation. Negative for nausea, vomiting, abdominal pain, diarrhea, blood in stool, abdominal distention and anal bleeding.  Genitourinary: Negative for dysuria, urgency, frequency, flank pain, decreased urine volume, enuresis and difficulty urinating.  Musculoskeletal: Negative for myalgias, back pain, joint swelling, arthralgias and gait problem.  Skin: Negative for color change, pallor, rash and wound.  Neurological: Positive for headaches (a little better). Negative for dizziness, tremors, seizures, syncope, facial asymmetry, speech difficulty, weakness, light-headedness and numbness.  Hematological: Negative for adenopathy. Does not bruise/bleed easily.  Psychiatric/Behavioral: Negative for suicidal ideas, hallucinations, behavioral problems, confusion, sleep disturbance, self-injury, dysphoric mood, decreased concentration and agitation. The patient is nervous/anxious. The patient is not hyperactive.        Objective:   Physical Exam  Vitals reviewed. Constitutional: He is oriented to person, place, and time. He appears well-developed and well-nourished. No distress.  HENT:  Head: Normocephalic.  Right  Ear: External ear normal.  Left Ear: External ear normal.  Nose: Nose normal.  Mouth/Throat: Oropharynx is clear and moist.  Eyes: Conjunctivae and EOM are normal. Pupils are equal, round, and reactive to light. Right eye exhibits no discharge. Left eye exhibits no discharge. No scleral icterus.  Neck: Normal range of motion. Neck supple. No JVD present. No tracheal deviation present. No thyromegaly present.  Cardiovascular: Normal rate, regular rhythm, normal heart sounds and intact distal pulses.  Exam reveals no gallop and no friction rub.   No murmur heard. Pulmonary/Chest: Effort normal and breath sounds normal. No stridor. No respiratory distress. He has no wheezes. He has no rales. He exhibits no tenderness.  Abdominal: Soft. Bowel sounds are normal. He exhibits no distension. There is no tenderness. There is no rebound and no guarding.  Musculoskeletal: Normal range of motion. He exhibits no edema and no tenderness.  Lymphadenopathy:    He has no cervical adenopathy.  Neurological: He is alert and oriented to person, place, and time. He has normal reflexes. He displays normal reflexes. No cranial nerve deficit. He exhibits normal muscle tone. Coordination normal.  Skin: Skin is warm and dry. No rash noted. He is not diaphoretic. No erythema. No pallor.  Psychiatric: He has a normal mood and affect. His behavior is normal. Judgment and thought content normal.          Assessment & Plan:

## 2011-02-15 NOTE — Assessment & Plan Note (Signed)
Restart klonopin and continue f/up with Dr. Evelene Croon

## 2011-02-15 NOTE — Telephone Encounter (Signed)
Wylene Simmer from pt's employer is calling to clarify patient's return to work date.  Employer has note written on 01/27/11 stating pt can return to work on 03/13/11.  Note written on 02/10/11 states pt may return to work today, 02/15/11.  Pt is at work now with contingency that return to work date will be verified by patient physician.  Please advise if patient may return to work today.  Thank you.

## 2011-02-16 ENCOUNTER — Telehealth: Payer: Self-pay | Admitting: *Deleted

## 2011-02-16 NOTE — Telephone Encounter (Signed)
Patient requesting a call from MD. He has some c/o stomach upset.

## 2011-02-17 NOTE — Telephone Encounter (Signed)
Left mess to call office back.   

## 2011-02-17 NOTE — Telephone Encounter (Signed)
Pt left another VM - he c/o "stomach upset", left vm for pt to call office back.

## 2011-02-17 NOTE — Telephone Encounter (Signed)
Looks like he has an apt on 9/28. He did have 8/27 apt but looks like it was canceled by patient. Is this ok?

## 2011-02-17 NOTE — Telephone Encounter (Signed)
He needs to be seen in GI asap

## 2011-02-17 NOTE — Telephone Encounter (Signed)
No, he needs to be seen asap

## 2011-02-22 ENCOUNTER — Ambulatory Visit: Payer: 59 | Admitting: Gastroenterology

## 2011-02-22 NOTE — Telephone Encounter (Signed)
Pt has attempted to get earlier apt w/GI. We need to call and attempt to schedule earlier OV.

## 2011-02-24 NOTE — Telephone Encounter (Signed)
Sent messg to [facility] to check on appt

## 2011-02-26 ENCOUNTER — Telehealth: Payer: Self-pay | Admitting: *Deleted

## 2011-02-26 ENCOUNTER — Inpatient Hospital Stay: Payer: Self-pay | Admitting: Psychiatry

## 2011-02-26 NOTE — Telephone Encounter (Signed)
Son just wanted MD to be aware that pt has been admitted and is on suicide watch. Son says pt has same sxs as "2 months ago", her is very confused and disoriented to place and time.  Son feels that pt is not safe to be home alone due to multiple suicide attempt and threats an is requesting advisement from MD in regards to this.

## 2011-02-26 NOTE — Telephone Encounter (Signed)
Pt has been admitted to [facility].

## 2011-02-26 NOTE — Telephone Encounter (Signed)
Pt's son is calling stating pt has been admitted into another [facility] facility. He is requesting a call back.

## 2011-03-05 ENCOUNTER — Telehealth: Payer: Self-pay | Admitting: *Deleted

## 2011-03-05 ENCOUNTER — Emergency Department (HOSPITAL_COMMUNITY)
Admission: EM | Admit: 2011-03-05 | Discharge: 2011-03-06 | Discharge status: Against Medical Advice | Payer: 59 | Attending: Emergency Medicine | Admitting: Emergency Medicine

## 2011-03-05 DIAGNOSIS — K1370 Unspecified lesions of oral mucosa: Secondary | ICD-10-CM | POA: Insufficient documentation

## 2011-03-05 DIAGNOSIS — M545 Low back pain, unspecified: Secondary | ICD-10-CM | POA: Insufficient documentation

## 2011-03-05 DIAGNOSIS — F29 Unspecified psychosis not due to a substance or known physiological condition: Secondary | ICD-10-CM | POA: Insufficient documentation

## 2011-03-05 DIAGNOSIS — R259 Unspecified abnormal involuntary movements: Secondary | ICD-10-CM | POA: Insufficient documentation

## 2011-03-05 NOTE — Telephone Encounter (Signed)
Pt's son called - pt got out of the hospital and son wanted a call back to know if he should follow up. Son is worried that patient is not taking meds correctly. Attempted to contact son, pt answered - I advised pt that he should follow up b/c he was just d/c'd from the hospital. Pt stated he would call back to schedule.

## 2011-03-06 ENCOUNTER — Emergency Department (HOSPITAL_COMMUNITY)
Admission: EM | Admit: 2011-03-06 | Discharge: 2011-03-06 | Disposition: A | Discharge status: Home / Self Care | Payer: 59 | Attending: Emergency Medicine | Admitting: Emergency Medicine

## 2011-03-06 DIAGNOSIS — F319 Bipolar disorder, unspecified: Secondary | ICD-10-CM | POA: Insufficient documentation

## 2011-03-06 DIAGNOSIS — IMO0001 Reserved for inherently not codable concepts without codable children: Secondary | ICD-10-CM | POA: Insufficient documentation

## 2011-03-06 DIAGNOSIS — Z79899 Other long term (current) drug therapy: Secondary | ICD-10-CM | POA: Insufficient documentation

## 2011-03-06 DIAGNOSIS — M62838 Other muscle spasm: Secondary | ICD-10-CM | POA: Insufficient documentation

## 2011-03-06 DIAGNOSIS — I252 Old myocardial infarction: Secondary | ICD-10-CM | POA: Insufficient documentation

## 2011-03-06 LAB — POCT I-STAT, CHEM 8
BUN: 12 mg/dL (ref 6–23)
Chloride: 111 mEq/L (ref 96–112)
Creatinine, Ser: 1.2 mg/dL (ref 0.50–1.35)
Glucose, Bld: 86 mg/dL (ref 70–99)
Potassium: 3.7 mEq/L (ref 3.5–5.1)

## 2011-03-06 LAB — CBC
HCT: 39.5 % (ref 39.0–52.0)
Hemoglobin: 13.8 g/dL (ref 13.0–17.0)
MCH: 34.8 pg — ABNORMAL HIGH (ref 26.0–34.0)
MCHC: 34.9 g/dL (ref 30.0–36.0)

## 2011-03-06 LAB — DIFFERENTIAL
Lymphocytes Relative: 9 % — ABNORMAL LOW (ref 12–46)
Monocytes Absolute: 0.6 10*3/uL (ref 0.1–1.0)
Monocytes Relative: 4 % (ref 3–12)
Neutro Abs: 12.4 10*3/uL — ABNORMAL HIGH (ref 1.7–7.7)

## 2011-03-06 LAB — COMPREHENSIVE METABOLIC PANEL
Alkaline Phosphatase: 77 U/L (ref 39–117)
BUN: 12 mg/dL (ref 6–23)
Calcium: 9.6 mg/dL (ref 8.4–10.5)
GFR calc Af Amer: >60 mL/min (ref >60)
GFR calc non Af Amer: >60 mL/min (ref >60)
Glucose, Bld: 131 mg/dL — ABNORMAL HIGH (ref 70–99)
Potassium: 3.5 mEq/L (ref 3.5–5.1)
Total Protein: 7.5 g/dL (ref 6.0–8.3)

## 2011-03-11 ENCOUNTER — Encounter: Payer: Self-pay | Admitting: Internal Medicine

## 2011-03-11 ENCOUNTER — Ambulatory Visit (INDEPENDENT_AMBULATORY_CARE_PROVIDER_SITE_OTHER): Payer: 59 | Admitting: Internal Medicine

## 2011-03-11 ENCOUNTER — Telehealth: Payer: Self-pay | Admitting: *Deleted

## 2011-03-11 VITALS — BP 124/80 | HR 75 | Temp 97.9°F | Resp 16 | Wt 142.0 lb

## 2011-03-11 DIAGNOSIS — K219 Gastro-esophageal reflux disease without esophagitis: Secondary | ICD-10-CM

## 2011-03-11 DIAGNOSIS — F29 Unspecified psychosis not due to a substance or known physiological condition: Secondary | ICD-10-CM

## 2011-03-11 DIAGNOSIS — R410 Disorientation, unspecified: Secondary | ICD-10-CM

## 2011-03-11 DIAGNOSIS — F3289 Other specified depressive episodes: Secondary | ICD-10-CM

## 2011-03-11 DIAGNOSIS — F329 Major depressive disorder, single episode, unspecified: Secondary | ICD-10-CM

## 2011-03-11 DIAGNOSIS — R197 Diarrhea, unspecified: Secondary | ICD-10-CM

## 2011-03-11 DIAGNOSIS — R51 Headache: Secondary | ICD-10-CM

## 2011-03-11 DIAGNOSIS — A09 Infectious gastroenteritis and colitis, unspecified: Secondary | ICD-10-CM

## 2011-03-11 DIAGNOSIS — R413 Other amnesia: Secondary | ICD-10-CM

## 2011-03-11 MED ORDER — METRONIDAZOLE 250 MG PO TABS: 250.0000 mg | ORAL_TABLET | Freq: Three times a day (TID) | ORAL | Status: AC

## 2011-03-11 MED ORDER — DEXLANSOPRAZOLE 60 MG PO CPDR: 60.0000 mg | DELAYED_RELEASE_CAPSULE | Freq: Every day | ORAL | Status: AC

## 2011-03-11 NOTE — Patient Instructions (Signed)
Gastroenteritis (Vomiting, Diarrhea, and/or Dehydration) Viral gastroenteritis is also known as stomach flu. This condition affects the stomach and intestinal tract. The illness typically lasts 3 to 8 days. Most people develop an immune response. This eventually gets rid of the virus. While this natural response develops, the virus can make you quite ill. The majority of those affected are young infants. CAUSES Diarrhea and vomiting are often caused by a virus. Medicines (antibiotics) that kill germs will not help unless there is also a germ (bacterial) infection. SYMPTOMS The most common symptom is diarrhea. This can cause severe loss of fluids (dehydration) and body salt (electrolyte) imbalance. TREATMENT Treatments for this illness are aimed at rehydration. Antidiarrheal medicines are not recommended. They do not decrease diarrhea volume and may be harmful. Usually, home treatment is all that is needed. The most serious cases involve vomiting so severely that you are not able to keep down fluids taken by mouth (orally). In these cases, intravenous (IV) fluids are needed. Vomiting with viral gastroenteritis is common, but it will usually go away with treatment. HOME CARE INSTRUCTIONS Small amounts of fluids should be taken frequently. Large amounts at one time may not be tolerated. Plain water may be harmful in infants and the elderly. Oral rehydration solutions (ORS) are available at pharmacies and grocery stores. ORS replace water and important electrolytes in proper proportions. Sports drinks are not as effective as ORS and may be harmful due to sugars worsening diarrhea.  As a general guideline for children, replace any new fluid losses from diarrhea and/or vomiting with ORS as follows:   If your child weighs 22 pounds or under (10 kg or less), give 60-120 mL (1/4 - 1/2 cup or 2 - 4 ounces) of ORS for each diarrheal stool or vomiting episode.   If your child weighs more than 22 pounds (more  than 10 kgs), give 120-240 mL (1/2 - 1 cup or 4 - 8 ounces) of ORS for each diarrheal stool or vomiting episode.   In a child with vomiting, it may be helpful to give the above ORS replacement in 5 mL (1 teaspoon) amounts every 5 minutes, then increase as tolerated.   While correcting for dehydration, children should eat normally. However, foods high in sugar should be avoided because this may worsen diarrhea. Large amounts of carbonated soft drinks, juice, gelatin desserts, and other highly sugared drinks should be avoided.   After correction of dehydration, other liquids that are appealing to the child may be added. Children should drink small amounts of fluids frequently and fluids should be increased as tolerated.   Adults should eat normally while drinking more fluids than usual. Drink small amounts of fluids frequently and increase as tolerated. Drink enough water and fluids to keep your urine clear or pale yellow. Broths, weak decaffeinated tea, lemon-lime soft drinks (allowed to go flat), and ORS replace fluids and electrolytes.  Avoid:  Carbonated drinks.  Juice.   Extremely hot or cold fluids.   Caffeine drinks.   Fatty, greasy foods.   Alcohol.  Tobacco.   Too much intake of anything at one time.   Gelatin desserts.    Probiotics are active cultures of beneficial bacteria. They may lessen the amount and number of diarrheal stools in adults. Probiotics can be found in yogurt with active cultures and in supplements.   Wash your hands well to avoid spreading bacteria and viruses.   Antidiarrheal medicines are not recommended for infants and children.   Only take over-the-counter or  prescription medicines for pain, discomfort, or fever as directed by your caregiver. Do not give aspirin to children.   For adults with dehydration, ask your caregiver if you should continue all prescribed and over-the-counter medicines.   If your caregiver has given you a follow-up  appointment, it is very important to keep that appointment. Not keeping the appointment could result in a lasting (chronic) or permanent injury and disability. If there is any problem keeping the appointment, you must call to reschedule.  SEEK IMMEDIATE MEDICAL CARE IF:  You are unable to keep fluids down.   There is no urine output in 6 to 8 hours or there is only a small amount of very dark urine.   You develop shortness of breath.   There is blood in the vomit (may look like coffee grounds) or stool.   Belly (abdominal) pain develops, increases, or localizes.   There is persistent vomiting or diarrhea.   You or your child has an oral temperature above 100.5, not controlled by medicine.   Your baby is older than 3 months with a rectal temperature of 102 F (38.9 C) or higher.   Your baby is 54 months old or younger with a rectal temperature of 100.4 F (38 C) or higher.  MAKE SURE YOU:  Understand these instructions.   Will watch your condition.   Will get help right away if you are not doing well or get worse.  Document Released: 06/14/2005 Document Re-Released: 12/02/2009 [facility] Patient Information 2011 [place], [place].

## 2011-03-11 NOTE — Assessment & Plan Note (Signed)
Restart dexilant, he was changed to zantac at [facility] but that has not been effectvie, he sees GI soon as well, he has not had anymore hematemesis and his CBC appears stable

## 2011-03-11 NOTE — Assessment & Plan Note (Addendum)
His situation with the psych meds is very confusing, his sister tells me that when he left [facility] he was on  risperdal but the next day he saw Dr. Evelene Croon and she started seroquel so he has been taking both in addition to a new Rx for klonopin that she gave him and saphris for insomnia. I have spoken to Dr. Evelene Croon to clarify this - she told me that he should be on seroquel and not on the other 2 antipsychotics.

## 2011-03-11 NOTE — Assessment & Plan Note (Signed)
Since I last saw him he was admitted to [facility] for confusion without much in the way of abnormal findings according to the report of his sister today, the family tells me that he has periods of severe confusion and that they are finding hidden bottles and stashes of klonopin around the house. The pt is unable to take his meds properly because he wakes up after 2-3 hours of sleep and thinks it is the next day and takes meds too early and on the wrong day. h fghts with his son when he can't have access to all of his meds. 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

## 2011-03-11 NOTE — Progress Notes (Signed)
Subjective:    Patient ID: [name], male    DOB: 02-07-1961, 50 y.o.   MRN: [id number]  Diarrhea  This is a new problem. The current episode started 1 to 4 weeks ago. The problem occurs 5 to 10 times per day. The problem has been unchanged. The stool consistency is described as mucous. The patient states that diarrhea awakens him from sleep. Associated symptoms include increased flatus. Pertinent negatives include no abdominal pain, arthralgias, bloating, chills, coughing, fever, headaches, myalgias, sweats, URI, vomiting or weight loss. The symptoms are aggravated by nothing. Risk factors include recent hospitalization. He has tried anti-motility drug for the symptoms. The treatment provided moderate relief.  Gastrophageal Reflux He complains of heartburn. He reports no abdominal pain, no belching, no chest pain, no choking, no coughing, no dysphagia, no early satiety, no globus sensation, no hoarse voice, no nausea, no sore throat, no stridor, no tooth decay, no water brash or no wheezing. This is a recurrent problem. The current episode started 1 to 4 weeks ago. The problem occurs frequently. The problem has been unchanged. The heartburn duration is several minutes. The heartburn is located in the substernum. The heartburn is of moderate intensity. The heartburn wakes him from sleep. The heartburn does not limit his activity. The heartburn doesn't change with position. The symptoms are aggravated by nothing. Associated symptoms include anemia and fatigue. Pertinent negatives include no melena, muscle weakness, orthopnea or weight loss. Risk factors include smoking/tobacco exposure and ETOH use. He has tried a histamine-2 antagonist for the symptoms. The treatment provided mild relief.      Review of Systems  Constitutional: Positive for activity change (lathargic and sleepy) and fatigue. Negative for fever, chills, weight loss, diaphoresis, appetite change and unexpected weight change.    HENT: Negative for hearing loss, ear pain, nosebleeds, congestion, sore throat, hoarse voice, facial swelling, rhinorrhea, sneezing, drooling, trouble swallowing, neck pain, neck stiffness, voice change, postnasal drip, sinus pressure, tinnitus and ear discharge.   Eyes: Negative.   Respiratory: Negative for apnea, cough, choking, chest tightness, shortness of breath, wheezing and stridor.   Cardiovascular: Negative for chest pain, palpitations and leg swelling.  Gastrointestinal: Positive for heartburn, diarrhea and flatus. Negative for dysphagia, nausea, vomiting, abdominal pain, constipation, blood in stool, melena, abdominal distention, anal bleeding, rectal pain and bloating.  Genitourinary: Negative for dysuria, urgency, frequency, hematuria, decreased urine volume, enuresis, difficulty urinating and testicular pain.  Musculoskeletal: Negative for myalgias, back pain, joint swelling, arthralgias, gait problem and muscle weakness.  Skin: Negative for color change, pallor, rash and wound.  Neurological: Negative for dizziness, tremors, seizures, syncope, facial asymmetry, speech difficulty, weakness, light-headedness, numbness and headaches.  Hematological: Negative for adenopathy. Does not bruise/bleed easily.  Psychiatric/Behavioral: Positive for behavioral problems (hiding bottles of klonopin and arguing about meds with his son), confusion, dysphoric mood and decreased concentration. Negative for suicidal ideas, hallucinations, sleep disturbance, self-injury and agitation. The patient is nervous/anxious. The patient is not hyperactive.        Objective:   Physical Exam  Vitals reviewed. Constitutional: He is oriented to person, place, and time. He appears well-developed and well-nourished. No distress.  HENT:  Mouth/Throat: Oropharynx is clear and moist. No oropharyngeal exudate.  Eyes: Conjunctivae are normal. Right eye exhibits no discharge. Left eye exhibits no discharge. No scleral  icterus.  Neck: Normal range of motion. Neck supple. No JVD present. No tracheal deviation present. No thyromegaly present.  Cardiovascular: Normal rate, regular rhythm, normal heart sounds and intact distal  pulses.  Exam reveals no gallop and no friction rub.   No murmur heard. Pulmonary/Chest: Effort normal and breath sounds normal. No stridor. No respiratory distress. He has no wheezes. He has no rales. He exhibits no tenderness.  Abdominal: Soft. Bowel sounds are normal. He exhibits no distension and no mass. There is no tenderness. There is no rebound and no guarding.  Musculoskeletal: Normal range of motion. He exhibits no edema and no tenderness.  Lymphadenopathy:    He has no cervical adenopathy.  Neurological: He is alert and oriented to person, place, and time. He has normal reflexes. He displays normal reflexes. No cranial nerve deficit. He exhibits normal muscle tone. Coordination normal.  Skin: Skin is warm and dry. No rash noted. He is not diaphoretic. No erythema. No pallor.  Psychiatric: Judgment normal. His mood appears not anxious. His affect is not angry, not blunt, not labile and not inappropriate. His speech is delayed. His speech is not rapid and/or pressured, not tangential and not slurred. He is slowed and withdrawn. He is not agitated, not aggressive, is not hyperactive, not actively hallucinating and not combative. Thought content is not paranoid and not delusional. Cognition and memory are not impaired. He does not express impulsivity or inappropriate judgment. He exhibits a depressed mood. He expresses no homicidal and no suicidal ideation. He expresses no suicidal plans and no homicidal plans. He is communicative. He exhibits normal recent memory and normal remote memory. He is inattentive.          Assessment & Plan:

## 2011-03-11 NOTE — Assessment & Plan Note (Signed)
I am concerned about parasites and C diff infection so I have started him on flagyl

## 2011-03-11 NOTE — Assessment & Plan Note (Signed)
This is an ongoing issue as addressed above

## 2011-03-11 NOTE — Telephone Encounter (Signed)
Pt was in the office today. He says MD discussed his meds with Dr Carie Caddy office but pt and family are still confused. (Med list also has duplicates) He needs clarification on klonopin, seroquel and depakote. Would you like [place] to call Dr Carie Caddy office to clarify what he is supposed to be taking?

## 2011-03-12 DIAGNOSIS — Z0279 Encounter for issue of other medical certificate: Secondary | ICD-10-CM

## 2011-03-12 NOTE — Telephone Encounter (Signed)
yes

## 2011-03-12 NOTE — Telephone Encounter (Signed)
Attempted to call psych office x 2 today. ? Closed on Friday. Will try again Monday AM.

## 2011-03-16 NOTE — Telephone Encounter (Signed)
Called Dr. Carie Caddy to clarify medications. Per nurse Klonopin 2 mg BID, Seroquel 200 mg 1-3 qhs, Depakote ER 500 3 po qhs . Medication list updated

## 2011-03-17 NOTE — Telephone Encounter (Signed)
Mailed copy of med list for patient's records and for him to confirm meds he has at home.

## 2011-03-26 ENCOUNTER — Encounter: Payer: Self-pay | Admitting: Gastroenterology

## 2011-03-26 ENCOUNTER — Ambulatory Visit (INDEPENDENT_AMBULATORY_CARE_PROVIDER_SITE_OTHER): Payer: 59 | Admitting: Gastroenterology

## 2011-03-26 DIAGNOSIS — K219 Gastro-esophageal reflux disease without esophagitis: Secondary | ICD-10-CM

## 2011-03-26 DIAGNOSIS — A09 Infectious gastroenteritis and colitis, unspecified: Secondary | ICD-10-CM

## 2011-03-26 DIAGNOSIS — Z800 Family history of malignant neoplasm of digestive organs: Secondary | ICD-10-CM

## 2011-03-26 DIAGNOSIS — R197 Diarrhea, unspecified: Secondary | ICD-10-CM

## 2011-03-26 DIAGNOSIS — R1310 Dysphagia, unspecified: Secondary | ICD-10-CM | POA: Insufficient documentation

## 2011-03-26 MED ORDER — PEG-KCL-NACL-NASULF-NA ASC-C 100 G PO SOLR: 1.0000 | Freq: Once | ORAL | Status: DC

## 2011-03-26 NOTE — Assessment & Plan Note (Addendum)
Diarrhea may be medicine-related.  Symptoms are coincidental with beginning Seroquel and Depakote.  An infectious etiology is less likely in the face of prior therapy with Flagyl. A structural abnormality of. the colon should be ruled out.  Recommendations #1 colonoscopy. If negative I would consider holding his psychiatric medicines including Depakote and Seroquel

## 2011-03-26 NOTE — Assessment & Plan Note (Addendum)
Rule out early esophageal stricture.  Recommendations #1 upper endoscopy with dilatation as indicated  I will discuss holding Plavix with Dr. Yetta Barre

## 2011-03-26 NOTE — Progress Notes (Signed)
History of Present Illness:  [name] is a pleasant 50 year old white male with history of coronary artery  Disease, hypertension, depression,  S/p acute MI in February, 2012, referred at the request of Dr. Yetta Barre for evaluation of diarrhea. Diarrhea  has been a severe problem for the past month. He has multiple stools during the day and occasionally at night with incontinence. He is without pain or bleeding. He was treated with Flagyl without improvement. Approximately 6 weeks ago he was started on Seroquel and Depakote.  The patient also complains of dysphagia to solids. He has occasional pyrosis for which he is taking dexilant.  He is on Plavix.    Review of Systems: He has occasional pinching chest discomfort. Pertinent positive and negative review of systems were noted in the above HPI section. All other review of systems were otherwise negative.    Current Medications, Allergies, Past Medical History, Past Surgical History, Family History and Social History were reviewed in [employer] electronic medical record  Vital signs were reviewed in today's medical record. Physical Exam: General: Well developed , well nourished, no acute distress Head: Normocephalic and atraumatic Eyes:  sclerae anicteric, EOMI Ears: Normal auditory acuity Mouth: No deformity or lesions Lungs: Clear throughout to auscultation Heart: Regular rate and rhythm; no murmurs, rubs or bruits Abdomen: Soft,and non distended. No masses, hepatosplenomegaly or hernias noted. Normal Bowel sounds. There is mild diffuse upper abdominal discomfort Rectal:deferred Musculoskeletal: Symmetrical with no gross deformities  Pulses:  Normal pulses noted Extremities: No clubbing, cyanosis, edema or deformities noted Neurological: Alert oriented x 4, grossly nonfocal Psychological:  Alert and cooperative. Normal mood and affect

## 2011-03-26 NOTE — Assessment & Plan Note (Signed)
Plan to continue Dexilant

## 2011-03-26 NOTE — Assessment & Plan Note (Signed)
Patient with a colonoscopy every 5 years

## 2011-03-26 NOTE — Patient Instructions (Addendum)
You have been given a separate informational sheet regarding your tobacco use, the importance of quitting and local resources to help you quit. We will contact [name] ,MD about holding your Plavix If you have not heard from our office 1 week prior to your procedure please contact our office Your Colon/Endo is scheduled on 04/12/2011 at 3pm

## 2011-03-29 ENCOUNTER — Telehealth: Payer: Self-pay | Admitting: *Deleted

## 2011-03-29 NOTE — Telephone Encounter (Signed)
Pt continues to c/o diarrhea. He was on antibiotics x 10 days w/no improvement. Patient requesting RX to help with symptoms.

## 2011-03-29 NOTE — Telephone Encounter (Signed)
Let's schedule him for a f. Sig only while on plavix ----- Message ----- From: Micheline Chapman, MD Sent: 03/28/2011 11:07 PM To: Louis Meckel, MD, Merri Ray, CMA, *  He had an MI in February and was treated with overlapping drug-eluting stents. He shouldn't hold plavix until he is out to 12 months from his intervention. thx  ----- Message ----- From: Merri Ray, CMA Sent: 03/26/2011 10:10 AM To: Micheline Chapman, MD, [name], MD   CHANGED PTS PROCEDURE TO A FLEX SIG WHILE ON PLAVIX PER DR Arlyce Dice. EXPLAINED NEW INSTRUCTIONS TO PT. ALL TIMES ARE THE SAME FOR PTS PROCEDURE. TOLD PT TO CALL OFFICE BACK IF HE HAS ANY QUESTIONS. TOLD PT TO CONTINUE PLAVIX

## 2011-03-30 MED ORDER — DIPHENOXYLATE-ATROPINE 2.5-0.025 MG PO TABS: 1.0000 | ORAL_TABLET | Freq: Four times a day (QID) | ORAL | Status: DC | PRN

## 2011-03-30 NOTE — Telephone Encounter (Signed)
Called in RX, Patient informed

## 2011-03-30 NOTE — Telephone Encounter (Signed)
done

## 2011-03-31 ENCOUNTER — Telehealth: Payer: Self-pay

## 2011-03-31 NOTE — Telephone Encounter (Signed)
Pt called stating he is experiencing Bilateral LE swelling, ankle up to shin. Pt also says he is also still having diarrhea even with Lomotil. Pt is requesting advisement from MD. Can medication be adjusted?

## 2011-03-31 NOTE — Telephone Encounter (Signed)
No, that is the max dose

## 2011-03-31 NOTE — Telephone Encounter (Signed)
Left detailed vm for pt including that he needs to f/u with GI or our office w/continued diarrhea & swelling

## 2011-04-01 ENCOUNTER — Telehealth: Payer: Self-pay | Admitting: *Deleted

## 2011-04-01 NOTE — Telephone Encounter (Signed)
Spoke w/pt's sister who is helping to care for patient. Pt continues to c/o swelling in his feet. NO SOB. He did have to take one nitroglycerin today. I advised her to go to ER if pt had SOB, CP or if nitro did not help after 3 doses. He was seen by psych yesterday who doubled his seroquel dose to 800 mg hs. He is severely lethargic from seroquel increase, she has already discussed this with the psychiatrist. Med list updated. Sister understands to take pt to ER if symptoms change or become worse.  Scheduled for OV tomorrow for eval of swelling.

## 2011-04-02 ENCOUNTER — Inpatient Hospital Stay (HOSPITAL_COMMUNITY)
Admission: EM | Admit: 2011-04-02 | Discharge: 2011-04-05 | DRG: 558 | Disposition: A | Discharge status: Home / Self Care | Payer: 59 | Attending: Internal Medicine | Admitting: Internal Medicine

## 2011-04-02 ENCOUNTER — Ambulatory Visit (INDEPENDENT_AMBULATORY_CARE_PROVIDER_SITE_OTHER): Payer: 59 | Admitting: Internal Medicine

## 2011-04-02 ENCOUNTER — Emergency Department (HOSPITAL_COMMUNITY): Payer: 59

## 2011-04-02 ENCOUNTER — Encounter: Payer: Self-pay | Admitting: Internal Medicine

## 2011-04-02 DIAGNOSIS — R0609 Other forms of dyspnea: Secondary | ICD-10-CM

## 2011-04-02 DIAGNOSIS — I252 Old myocardial infarction: Secondary | ICD-10-CM

## 2011-04-02 DIAGNOSIS — Z7902 Long term (current) use of antithrombotics/antiplatelets: Secondary | ICD-10-CM

## 2011-04-02 DIAGNOSIS — F411 Generalized anxiety disorder: Secondary | ICD-10-CM | POA: Diagnosis present

## 2011-04-02 DIAGNOSIS — Z7982 Long term (current) use of aspirin: Secondary | ICD-10-CM

## 2011-04-02 DIAGNOSIS — B029 Zoster without complications: Secondary | ICD-10-CM | POA: Insufficient documentation

## 2011-04-02 DIAGNOSIS — E860 Dehydration: Secondary | ICD-10-CM | POA: Diagnosis present

## 2011-04-02 DIAGNOSIS — Z79899 Other long term (current) drug therapy: Secondary | ICD-10-CM

## 2011-04-02 DIAGNOSIS — K219 Gastro-esophageal reflux disease without esophagitis: Secondary | ICD-10-CM | POA: Diagnosis present

## 2011-04-02 DIAGNOSIS — F17200 Nicotine dependence, unspecified, uncomplicated: Secondary | ICD-10-CM | POA: Diagnosis present

## 2011-04-02 DIAGNOSIS — I2510 Atherosclerotic heart disease of native coronary artery without angina pectoris: Secondary | ICD-10-CM | POA: Diagnosis present

## 2011-04-02 DIAGNOSIS — F329 Major depressive disorder, single episode, unspecified: Secondary | ICD-10-CM | POA: Diagnosis present

## 2011-04-02 DIAGNOSIS — M6282 Rhabdomyolysis: Principal | ICD-10-CM | POA: Diagnosis present

## 2011-04-02 DIAGNOSIS — Z9861 Coronary angioplasty status: Secondary | ICD-10-CM

## 2011-04-02 DIAGNOSIS — F3289 Other specified depressive episodes: Secondary | ICD-10-CM | POA: Diagnosis present

## 2011-04-02 DIAGNOSIS — I10 Essential (primary) hypertension: Secondary | ICD-10-CM

## 2011-04-02 DIAGNOSIS — R0600 Dyspnea, unspecified: Secondary | ICD-10-CM | POA: Insufficient documentation

## 2011-04-02 DIAGNOSIS — Z87442 Personal history of urinary calculi: Secondary | ICD-10-CM

## 2011-04-02 DIAGNOSIS — R197 Diarrhea, unspecified: Secondary | ICD-10-CM | POA: Diagnosis present

## 2011-04-02 DIAGNOSIS — E876 Hypokalemia: Secondary | ICD-10-CM | POA: Diagnosis present

## 2011-04-02 DIAGNOSIS — R079 Chest pain, unspecified: Secondary | ICD-10-CM | POA: Insufficient documentation

## 2011-04-02 DIAGNOSIS — F410 Panic disorder [episodic paroxysmal anxiety] without agoraphobia: Secondary | ICD-10-CM | POA: Diagnosis present

## 2011-04-02 LAB — CBC
HCT: 43.3 % (ref 39.0–52.0)
Hemoglobin: 15.6 g/dL (ref 13.0–17.0)
MCH: 34.8 pg — ABNORMAL HIGH (ref 26.0–34.0)
MCHC: 36 g/dL (ref 30.0–36.0)
MCV: 96.7 fL (ref 78.0–100.0)
Platelets: 176 K/uL (ref 150–400)
RBC: 4.48 MIL/uL (ref 4.22–5.81)
RDW: 12.2 % (ref 11.5–15.5)
WBC: 8.8 K/uL (ref 4.0–10.5)

## 2011-04-02 LAB — URINALYSIS, ROUTINE W REFLEX MICROSCOPIC
Glucose, UA: NEGATIVE mg/dL
Hgb urine dipstick: NEGATIVE
Ketones, ur: 15 mg/dL — AB
Leukocytes, UA: NEGATIVE
Nitrite: NEGATIVE
Protein, ur: 30 mg/dL — AB
Specific Gravity, Urine: 1.029 (ref 1.005–1.030)
Urobilinogen, UA: 0.2 mg/dL (ref 0.0–1.0)
pH: 6 (ref 5.0–8.0)

## 2011-04-02 LAB — LIPASE, BLOOD: Lipase: 23 U/L (ref 11–59)

## 2011-04-02 LAB — DIFFERENTIAL
Basophils Absolute: 0 K/uL (ref 0.0–0.1)
Basophils Relative: 0 % (ref 0–1)
Eosinophils Absolute: 0 K/uL (ref 0.0–0.7)
Eosinophils Relative: 1 % (ref 0–5)
Lymphocytes Relative: 17 % (ref 12–46)
Lymphs Abs: 1.5 10*3/uL (ref 0.7–4.0)
Monocytes Absolute: 0.9 10*3/uL (ref 0.1–1.0)
Monocytes Relative: 10 % (ref 3–12)
Neutro Abs: 6.4 K/uL (ref 1.7–7.7)
Neutrophils Relative %: 72 % (ref 43–77)

## 2011-04-02 LAB — COMPREHENSIVE METABOLIC PANEL
ALT: 66 U/L — ABNORMAL HIGH (ref 0–53)
AST: 145 U/L — ABNORMAL HIGH (ref 0–37)
Alkaline Phosphatase: 81 U/L (ref 39–117)
GFR calc Af Amer: 84 mL/min — ABNORMAL LOW (ref >90)
Glucose, Bld: 124 mg/dL — ABNORMAL HIGH (ref 70–99)
Potassium: 3.6 mEq/L (ref 3.5–5.1)
Sodium: 136 mEq/L (ref 135–145)
Total Protein: 7.1 g/dL (ref 6.0–8.3)

## 2011-04-02 LAB — CK TOTAL AND CKMB (NOT AT ARMC)
CK, MB: 15 ng/mL (ref 0.3–4.0)
Total CK: 8223 U/L — ABNORMAL HIGH (ref 7–232)

## 2011-04-02 LAB — APTT: aPTT: 41 seconds — ABNORMAL HIGH (ref 24–37)

## 2011-04-02 LAB — COMPREHENSIVE METABOLIC PANEL WITH GFR
Albumin: 3 g/dL — ABNORMAL LOW (ref 3.5–5.2)
BUN: 11 mg/dL (ref 6–23)
CO2: 24 meq/L (ref 19–32)
Calcium: 9.1 mg/dL (ref 8.4–10.5)
Chloride: 100 meq/L (ref 96–112)
Creatinine, Ser: 1.14 mg/dL (ref 0.50–1.35)
GFR calc non Af Amer: 72 mL/min — ABNORMAL LOW (ref >90)
Total Bilirubin: 0.4 mg/dL (ref 0.3–1.2)

## 2011-04-02 LAB — PROTIME-INR
INR: 1.16 (ref 0.00–1.49)
Prothrombin Time: 15 seconds (ref 11.6–15.2)

## 2011-04-02 LAB — URINE MICROSCOPIC-ADD ON

## 2011-04-02 LAB — TROPONIN I: Troponin I: <0.3 ng/mL (ref <0.30)

## 2011-04-02 NOTE — Assessment & Plan Note (Signed)
As per the cp work-up

## 2011-04-02 NOTE — Assessment & Plan Note (Signed)
With the nausea i think he would be best treated with iv acyclovir

## 2011-04-02 NOTE — Assessment & Plan Note (Signed)
He has new ST depression over the lateral leads so I sent him to the ER with his sister for emergent evaluation

## 2011-04-02 NOTE — Progress Notes (Signed)
Subjective:    Patient ID: [name], male    DOB: October 10, 1960, 50 y.o.   MRN: [id number]  HPI He returns for f/up and tells me that he had an episode of CP yesterday and he used ntg with relief of the pain but he feels short of breath, swollen, and fatigued today. Also, he developed a painful rash that is on his left earlobe and in the front of his left shoulder. He thought it was an allergy to an new psych med (lamotrigine) so he stopped that and took some benadryl but today the pain persists and his left arm feels swollen. He has not eaten anything for 2 days and can barely keep down liquids due to nausea but he has not vomited.    Review of Systems  Constitutional: Positive for fever, chills, diaphoresis, appetite change and fatigue. Negative for activity change and unexpected weight change.  HENT: Negative for hearing loss, ear pain, sore throat, facial swelling, trouble swallowing, neck pain, neck stiffness, voice change and ear discharge.   Eyes: Negative for photophobia, pain, discharge, redness, itching and visual disturbance.  Respiratory: Positive for chest tightness and shortness of breath. Negative for apnea, cough, choking, wheezing and stridor.   Cardiovascular: Positive for chest pain and leg swelling. Negative for palpitations.  Gastrointestinal: Positive for nausea. Negative for vomiting, abdominal pain, diarrhea, constipation, blood in stool, abdominal distention, anal bleeding and rectal pain.  Genitourinary: Positive for difficulty urinating (no urine for 3 days). Negative for dysuria, urgency, frequency, hematuria, flank pain, decreased urine volume and enuresis.  Musculoskeletal: Positive for myalgias and arthralgias (severe left hip pain but no injury). Negative for back pain, joint swelling and gait problem.  Skin: Positive for rash. Negative for color change, pallor and wound.  Neurological: Positive for dizziness and weakness. Negative for tremors, seizures,  syncope, facial asymmetry, speech difficulty, light-headedness and numbness.  Hematological: Negative for adenopathy. Does not bruise/bleed easily.  Psychiatric/Behavioral: Positive for dysphoric mood. Negative for suicidal ideas, hallucinations, behavioral problems, confusion, sleep disturbance, self-injury, decreased concentration and agitation. The patient is nervous/anxious. The patient is not hyperactive.        Objective:   Physical Exam  Vitals reviewed. Constitutional: He is oriented to person, place, and time. He appears well-developed and well-nourished. No distress.  HENT:  Mouth/Throat: Oropharynx is clear and moist. No oropharyngeal exudate.  Eyes: Conjunctivae and EOM are normal. Right eye exhibits no discharge. Left eye exhibits no discharge. No scleral icterus.  Neck: Normal range of motion. Neck supple. No JVD present. No tracheal deviation present. No thyromegaly present.  Cardiovascular: S1 normal and S2 normal.   No extrasystoles are present. Tachycardia present.  Exam reveals no gallop and no friction rub.   No murmur heard.  No systolic murmur is present   No diastolic murmur is present  Pulses:      Carotid pulses are 1+ on the right side, and 1+ on the left side.      Radial pulses are 1+ on the right side, and 1+ on the left side.       Femoral pulses are 1+ on the right side, and 1+ on the left side.      Popliteal pulses are 1+ on the right side, and 1+ on the left side.       Dorsalis pedis pulses are 1+ on the right side, and 1+ on the left side.       Posterior tibial pulses are 1+ on the right side,  and 1+ on the left side.  Pulmonary/Chest: Effort normal and breath sounds normal. No stridor. No respiratory distress. He has no wheezes. He has no rales. He exhibits no tenderness.  Abdominal: Soft. Bowel sounds are normal. He exhibits no distension and no mass. There is no tenderness. There is no rebound and no guarding.  Musculoskeletal: Normal range of  motion. He exhibits no edema and no tenderness.  Lymphadenopathy:    He has no cervical adenopathy.  Neurological: He is alert and oriented to person, place, and time. He has normal reflexes. He displays normal reflexes. No cranial nerve deficit. He exhibits normal muscle tone. Coordination normal.  Skin: Skin is warm, dry and intact. Rash noted. No abrasion, no bruising, no burn, no ecchymosis, no lesion, no petechiae and no purpura noted. Rash is vesicular. Rash is not macular, not papular, not maculopapular, not nodular, not pustular and not urticarial. He is not diaphoretic. There is erythema. No cyanosis. No pallor. Nails show no clubbing.          On his left anterior shoulder there is a cluster of vesicles with an erythematous base, same on the ear lobe, there is no systemic rash.  Psychiatric: He has a normal mood and affect. His behavior is normal. Judgment and thought content normal.      Lab Results  Component Value Date   WBC 14.6* 03/05/2011   HGB 12.9* 03/06/2011   HCT 38.0* 03/06/2011   PLT 267 03/05/2011   GLUCOSE 86 03/06/2011   CHOL 121 11/30/2010   TRIG 340.0* 11/30/2010   HDL 45.10 11/30/2010   LDLDIRECT 52.5 11/30/2010   LDLCALC 105* 10/06/2010   ALT 18 03/05/2011   AST 20 03/05/2011   NA 144 03/06/2011   K 3.7 03/06/2011   CL 111 03/06/2011   CREATININE 1.20 03/06/2011   BUN 12 03/06/2011   CO2 21 03/05/2011   TSH 0.28* 08/20/2010   INR 4.14* 08/06/2010   HGBA1C  Value: 5.3 (NOTE)                                                                       According to the ADA Clinical Practice Recommendations for 2011, when HbA1c is used as a screening test:   >=6.5%   Diagnostic of Diabetes Mellitus           (if abnormal result  is confirmed)  5.7-6.4%   Increased risk of developing Diabetes Mellitus  References:Diagnosis and Classification of Diabetes Mellitus,Diabetes Care,2011,34(Suppl 1):S62-S69 and Standards of Medical Care in         Diabetes - 2011,Diabetes Care,2011,34  (Suppl 1):S11-S61.  08/06/2010      Assessment & Plan:

## 2011-04-03 ENCOUNTER — Inpatient Hospital Stay (HOSPITAL_COMMUNITY): Payer: 59

## 2011-04-03 LAB — HEMOGLOBIN: Hemoglobin: 11.5 g/dL — ABNORMAL LOW (ref 13.0–17.0)

## 2011-04-03 LAB — URINE CULTURE
Colony Count: 2000
Culture  Setup Time: 201210052009

## 2011-04-03 LAB — CBC
Hemoglobin: 12 g/dL — ABNORMAL LOW (ref 13.0–17.0)
MCH: 33.9 pg (ref 26.0–34.0)
MCHC: 34.4 g/dL (ref 30.0–36.0)
MCV: 98.6 fL (ref 78.0–100.0)

## 2011-04-03 LAB — BASIC METABOLIC PANEL
BUN: 7 mg/dL (ref 6–23)
CO2: 25 mEq/L (ref 19–32)
Calcium: 8.2 mg/dL — ABNORMAL LOW (ref 8.4–10.5)
Creatinine, Ser: 0.72 mg/dL (ref 0.50–1.35)
GFR calc non Af Amer: >90 mL/min (ref >90)
Glucose, Bld: 95 mg/dL (ref 70–99)
Sodium: 140 mEq/L (ref 135–145)

## 2011-04-03 LAB — CARDIAC PANEL(CRET KIN+CKTOT+MB+TROPI)
CK, MB: 8.4 ng/mL (ref 0.3–4.0)
CK, MB: 6.3 ng/mL (ref 0.3–4.0)
Relative Index: 0.1 (ref 0.0–2.5)
Total CK: 6061 U/L — ABNORMAL HIGH (ref 7–232)
Troponin I: <0.3 ng/mL (ref <0.30)

## 2011-04-04 LAB — CK TOTAL AND CKMB (NOT AT ARMC)
CK, MB: 3.9 ng/mL (ref 0.3–4.0)
Relative Index: 0.1 (ref 0.0–2.5)
Total CK: 2998 U/L — ABNORMAL HIGH (ref 7–232)

## 2011-04-04 LAB — LIPID PANEL
Cholesterol: 134 mg/dL (ref 0–200)
Total CHOL/HDL Ratio: 4.8 RATIO
VLDL: 29 mg/dL (ref 0–40)

## 2011-04-04 LAB — CBC
HCT: 34.1 % — ABNORMAL LOW (ref 39.0–52.0)
MCH: 34.2 pg — ABNORMAL HIGH (ref 26.0–34.0)
MCV: 99.7 fL (ref 78.0–100.0)
Platelets: 150 10*3/uL (ref 150–400)
RDW: 12.5 % (ref 11.5–15.5)
WBC: 5.5 10*3/uL (ref 4.0–10.5)

## 2011-04-04 LAB — BASIC METABOLIC PANEL
BUN: 6 mg/dL (ref 6–23)
Calcium: 8.6 mg/dL (ref 8.4–10.5)
Chloride: 112 mEq/L (ref 96–112)
Creatinine, Ser: 0.7 mg/dL (ref 0.50–1.35)
GFR calc Af Amer: >90 mL/min (ref >90)

## 2011-04-05 LAB — COMPREHENSIVE METABOLIC PANEL
Alkaline Phosphatase: 55 U/L (ref 39–117)
BUN: 4 mg/dL — ABNORMAL LOW (ref 6–23)
Chloride: 108 mEq/L (ref 96–112)
Creatinine, Ser: 0.65 mg/dL (ref 0.50–1.35)
GFR calc Af Amer: >90 mL/min (ref >90)
GFR calc non Af Amer: >90 mL/min (ref >90)
Glucose, Bld: 86 mg/dL (ref 70–99)
Potassium: 3.2 mEq/L — ABNORMAL LOW (ref 3.5–5.1)
Total Bilirubin: 0.2 mg/dL — ABNORMAL LOW (ref 0.3–1.2)

## 2011-04-05 LAB — GIARDIA/CRYPTOSPORIDIUM SCREEN(EIA)
Cryptosporidium Screen (EIA): NEGATIVE
Giardia Screen - EIA: NEGATIVE

## 2011-04-06 NOTE — Discharge Summary (Signed)
NAMEKENZEL, RUESCH NO.:  [id number]  MEDICAL RECORD NO.:  [id number]  LOCATION:  3742                         FACILITY:  [facility]  PHYSICIAN:  Lonia Blood, M.D.       DATE OF BIRTH:  December 27, 1960  DATE OF ADMISSION:  04/02/2011 DATE OF DISCHARGE:  04/05/2011                              DISCHARGE SUMMARY   PRIMARY CARE PHYSICIAN:  Sanda Linger, MD.  DISCHARGE DIAGNOSES: 1. Dehydration and rhabdomyolysis, resolved. 2. Chronic diarrhea of unclear etiology - improved. 3. Hypokalemia. 4. Coronary artery disease status post myocardial infarction and     stenting February, 2012. 5. Anxiety and depression. 6. Tobacco abuse.  DISCHARGE MEDICATIONS: 1. Aspirin 325 mg daily. 2. Klonopin 1 mg 3 times a day. 3. Depakote 1000 mg at bedtime. 4. Dexilant 60 mg daily. 5. Potassium 10 mEq daily. 6. Lomotil one tablet 3 times a day. 7. Metoprolol 25 mg twice a day. 8. Nicotine patch 21 mg daily. 9. Nitroglycerin 0.4 mg one tablet every 5 minutes as needed for chest     pain. 10.Plavix 75 mg daily.  CONDITION DISCHARGE:  Mr. Broadus was discharged in good condition. Temperature 97.8, heart rate 86, respirations 18, blood pressure 124/79, saturation 97% on room air.  The patient is alert, oriented, in no acute distress.  Neurologically intact.  Minimal diarrhea.  The patient will follow up with Dr. Sanda Linger, his primary care physician.  He will follow up with Dr. Melvia Heaps from [facility] for endoscopy and colonoscopy for his chronic diarrhea.  PROCEDURE THIS ADMISSION:  The patient underwent an MRI of the brain, which was negative for acute strokes.  CONSULTATION:  No consultation obtained.  HISTORY AND PHYSICAL:  Refer dictated H and P done by Dr. Lavera Guise.  HOSPITAL COURSE:  Mr. Muccio is a 50 year old gentleman with known coronary artery disease status post stenting, presented to the emergency room with sepsis like syndrome.  He was  tachycardic, mildly hypotensive with generalized body aches.  He was admitted for further workup and observation.  HOSPITAL COURSE: 1. The patient was admitted to acute care unit at [facility].     He was placed on telemetry.  Because of his prior history of     coronary artery disease and his tachycardia, he had 3 sets of     cardiac enzymes which did not indicate any myocardial infarction.     He was found to have significant rhabdomyolysis with a total CK     level of 8223.  The patient also appeared to be dehydrated.  He was     started on intravenous fluids and his statin was discontinued.     Eventually, he became euvolemic and his rhabdomyolysis resolved.     We felt that the cause for his problems was that his     subacute/chronic diarrhea.  Clostridium difficile toxin by PCR was     negative.  He had Giardia/Cryptosporidium check, which was     negative.  He received empiric Flagyl which was then later     discontinued.  Due to the possibility for Staphylococcus bacteremia     as the culprit for his problems, he received  vancomycin for a day.     Once the blood cultures were negative.  The vancomycin was     discontinued.  Mr. Weathers will complete his workup for his chronic     diarrhea as an outpatient with Dr. Melvia Heaps from     Gastroenterology with endoscopy and colonoscopy.  On hospital day     #2, Mr. Woerner complained of some left-sided weakness.  He     underwent an emergent MRI of the brain which was negative for acute     strokes.  He was observed further and his weakness resolved.  We     felt that his weakness is probably related to some dehydration and     rhabdomyolysis rather than a true TIA stroke-like symptoms.  For     Mr. Cordner depression, we have adjusted his medications.  We     have discontinued Seroquel and continued only Klonopin and     Depakote.  He seems to be doing fairly well. 2. Coronary artery disease.  Mr. Colavito was  continued on aspirin,     Plavix, and beta-blocker.  For now, we have discontinued the     statin.  Once the diarrhea resolved, low-dose statin could be     resumed with a careful consideration for recurrent rhabdomyolysis.     Lonia Blood, M.D.     SL/MEDQ  D:  04/06/2011  T:  04/06/2011  Job:  [id number]  cc:   Sanda Linger, MD Barbette Hair. Arlyce Dice, MD,FACG  Electronically Signed by Lonia Blood M.D. on 04/06/2011 05:08:44 PM
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
## 2011-04-07 ENCOUNTER — Telehealth: Payer: Self-pay | Admitting: Gastroenterology

## 2011-04-07 NOTE — Telephone Encounter (Signed)
Discussed prep with pt and his sister, new instructions mailed also.

## 2011-04-08 LAB — CULTURE, BLOOD (ROUTINE X 2)
Culture  Setup Time: 201210052122
Culture: NO GROWTH

## 2011-04-08 LAB — OSMOLALITY, STOOL: Osmolality,Stl: 373 mOsm

## 2011-04-12 ENCOUNTER — Ambulatory Visit: Payer: 59 | Admitting: Internal Medicine

## 2011-04-12 ENCOUNTER — Encounter: Payer: Self-pay | Admitting: Gastroenterology

## 2011-04-12 ENCOUNTER — Ambulatory Visit (AMBULATORY_SURGERY_CENTER): Payer: 59 | Admitting: Gastroenterology

## 2011-04-12 VITALS — BP 130/70 | HR 74 | Temp 97.8°F | Resp 16 | Ht 66.0 in | Wt 152.0 lb

## 2011-04-12 DIAGNOSIS — K5150 Left sided colitis without complications: Secondary | ICD-10-CM

## 2011-04-12 DIAGNOSIS — K5289 Other specified noninfective gastroenteritis and colitis: Secondary | ICD-10-CM

## 2011-04-12 DIAGNOSIS — R197 Diarrhea, unspecified: Secondary | ICD-10-CM

## 2011-04-12 MED ORDER — SODIUM CHLORIDE 0.9 % IV SOLN: 500.0000 mL | INTRAVENOUS | Status: DC

## 2011-04-12 MED ORDER — MESALAMINE 1.2 G PO TBEC: 2400.0000 mg | DELAYED_RELEASE_TABLET | Freq: Every day | ORAL | Status: DC

## 2011-04-12 NOTE — Patient Instructions (Signed)
Green and blue discharge instructions reviewed with patient and care partner then placed in sealed envelope along with picture report.  Impressions/recommendations:  Colitis Diverticulosis (handout given)  High fiber diet (handout given)  Please call the office to schedule a follow up visit for in 4 weeks.  Resume medications as you were taking them prior to your procedure.  Begin Lialda 2. 4 gm every day

## 2011-04-13 ENCOUNTER — Telehealth: Payer: Self-pay

## 2011-04-13 NOTE — Telephone Encounter (Signed)
Follow up Call- Patient questions:  Do you have a fever, pain , or abdominal swelling? no Pain Score  0 *  Have you tolerated food without any problems? yes  Have you been able to return to your normal activities? yes  Do you have any questions about your discharge instructions: Diet   no Medications  no Follow up visit  no  Do you have questions or concerns about your Care? no  Actions: * If pain score is 4 or above: No action needed, pain <4.  I'm doing fine per the pt. maw

## 2011-04-16 ENCOUNTER — Ambulatory Visit (INDEPENDENT_AMBULATORY_CARE_PROVIDER_SITE_OTHER): Payer: 59 | Admitting: Internal Medicine

## 2011-04-16 ENCOUNTER — Other Ambulatory Visit (INDEPENDENT_AMBULATORY_CARE_PROVIDER_SITE_OTHER): Payer: 59

## 2011-04-16 ENCOUNTER — Encounter: Payer: Self-pay | Admitting: Internal Medicine

## 2011-04-16 VITALS — BP 136/82 | HR 71 | Temp 98.6°F | Resp 16 | Wt 141.0 lb

## 2011-04-16 DIAGNOSIS — M6282 Rhabdomyolysis: Secondary | ICD-10-CM

## 2011-04-16 DIAGNOSIS — R197 Diarrhea, unspecified: Secondary | ICD-10-CM

## 2011-04-16 DIAGNOSIS — I10 Essential (primary) hypertension: Secondary | ICD-10-CM

## 2011-04-16 LAB — URINALYSIS, ROUTINE W REFLEX MICROSCOPIC
Hgb urine dipstick: NEGATIVE
Total Protein, Urine: NEGATIVE
Urine Glucose: NEGATIVE

## 2011-04-16 LAB — COMPREHENSIVE METABOLIC PANEL
AST: 20 U/L (ref 0–37)
Albumin: 3.6 g/dL (ref 3.5–5.2)
Alkaline Phosphatase: 66 U/L (ref 39–117)
BUN: 4 mg/dL — ABNORMAL LOW (ref 6–23)
Potassium: 3.9 mEq/L (ref 3.5–5.1)
Sodium: 144 mEq/L (ref 135–145)

## 2011-04-16 LAB — CK: Total CK: 99 U/L (ref 7–232)

## 2011-04-16 MED ORDER — PANCRELIPASE (LIP-PROT-AMYL) 24000-76000 UNITS PO CPEP: 1.0000 | ORAL_CAPSULE | Freq: Three times a day (TID) | ORAL | Status: DC

## 2011-04-16 NOTE — Patient Instructions (Signed)
Chronic Diarrhea Diarrhea is loose, watery stools. Having diarrhea means passing loose stools 3 or more times a day. Diarrhea that lasts longer than 4 weeks is considered long-lasting (chronic). Symptoms of chronic diarrhea may be continual or may come and go. People of all ages can get diarrhea. Body fluid loss (dehydration) may occur as a result of diarrhea. This means the body does not have as many fluids and salts (electrolytes) as it needs. CAUSES  There are many causes of chronic diarrhea. Causes may be different for children and adults. The various causes can be grouped into 2 categories: diarrhea caused by an infection and diarrhea not caused by an infection. Sometimes, the cause is unknown. Diarrhea caused by an infection may result from:  Parasites.   Bacteria.   Viral infections.  Diarrhea not caused by an infection may result from:  Irritable bowel syndrome.   Reaction to medicines, such as antibiotics, cancer drugs, blood pressure medicines, and antacids.   Intestinal disease (Crohn's disease, ulcerative colitis, celiac disease).   Food allergies or sensitivity to additives (fructose, lactose, sugar substitutes).   Tumors.   Diabetes, thyroid disease, and other endocrine diseases.   Reduced blood flow to the intestine.   Previous surgery or radiation of the abdomen or gastrointestinal tract.  Risk factors for chronic diarrhea include:  Having a severely weakened immune system, such as from HIV/AIDS.   Taking certain types of cancer-fighting drugs (chemotherapy) or other medicines.   A recent organ transplant.   Having a portion of the stomach removed.   Traveling to countries where food and water supplies are often contaminated.  SYMPTOMS  In addition to frequent, loose stools, diarrhea may cause:  Cramping.   Abdominal pain.   Nausea.   Urgent need to use the bathroom, or loss of bowel control.  If dehydration occurs, problems include:  Thirst.    Less frequent urination.   Dark urine.   Dry skin.   Fatigue.   Dizziness.  Infections that cause diarrhea may also cause a fever, chills, or bloody stools. DIAGNOSIS  Diagnosis may be difficult. Your caregiver must take a careful history and perform a physical exam. Tests given are based on your symptoms and history. Tests may include:  Blood or stool tests, in which 3 or more stool samples may be examined. Stool cultures may be used to test for bacteria or parasites.   X-rays.   A procedure in which a thin tube is inserted into the mouth or rectum (endoscopy). This allows the caregiver to look inside the intestine.  TREATMENT   Diarrhea caused by an infection can often be treated with antibiotics.   Diarrhea not caused by an infection is more difficult to diagnose and treat. Long-term medicine use or surgery may be required. Specific treatment should be discussed with your caregiver.   If the cause cannot determined, treatment to relieve symptoms includes:   Preventing dehydration. Serious health problems can occur if you do not maintain proper fluid levels. Many oral rehydration solutions (ORS) are available at drug stores. Ask your caregiver what product is best for you.   Not drinking beverages that contain caffeine (tea, coffee, soft drinks).   Not drinking alcohol. It causes dehydration.   Not relying on sports drinks and broths alone to maintain proper fluid levels. They should not be used to prevent severe dehydration.   Maintaining well-balanced nutrition. This may help you recover faster.  PREVENTION   Drink clean or purified water.   Use proper  food handling techniques.   Maintain proper hand-washing habits.  HOME CARE INSTRUCTIONS   Avoid:   Caffeine.   Greasy foods.   High fiber.   If you have problems digesting lactose during or after an episode of diarrhea, you might want to try yogurt. Yogurt is often better tolerated, because it has less  lactose than milk. Yogurt with active, live bacterial cultures may even help you recover faster.  SEEK MEDICAL CARE IF:  The person with diarrhea is an otherwise healthy adult and has:  Signs of dehydration.   Diarrhea for more than 2 days.   Severe pain in the abdomen or rectum.   An oral temperature above 102 F (38.9 C).   Stools containing blood or pus.   Stools that are black and tarry.  SEEK IMMEDIATE MEDICAL CARE IF:  The person with diarrhea is a child, elderly person, or has a weakened immune system and has:  Signs of dehydration.   Diarrhea for more than 1 day.   Severe pain in the abdomen or rectum.   An oral temperature above 102 F (38.9 C), not controlled by medicine.   Stools containing blood or pus.   Stools that are black and tarry.  Document Released: 09/04/2003 Document Revised: 02/24/2011 Document Reviewed: 10/31/2009 [facility] Patient Information 2012 [place], [place].

## 2011-04-16 NOTE — Assessment & Plan Note (Signed)
He was seen by GI and still has no answers or improvement, I am concerned he has pancreatic insufficiency so I will check his stool elastase level and will give him a try of creon

## 2011-04-16 NOTE — Progress Notes (Signed)
Subjective:    Patient ID: [name], male    DOB: [birth year], 50 y.o.   MRN: [id number]  Diarrhea  This is a chronic problem. The current episode started more than 1 month ago. The problem occurs 5 to 10 times per day. The problem has been unchanged. The stool consistency is described as watery. The patient states that diarrhea does not awaken him from sleep. Associated symptoms include abdominal pain (cramping), bloating and myalgias. Pertinent negatives include no arthralgias, chills, coughing, fever, headaches, increased  flatus, sweats, URI, vomiting or weight loss. The symptoms are aggravated by nothing. Risk factors include no known risk factors. He has tried anti-motility drug for the symptoms. The treatment provided no relief.      Review of Systems  Constitutional: Positive for fatigue. Negative for fever, chills, weight loss, diaphoresis, activity change, appetite change and unexpected weight change.  HENT: Negative for sore throat, facial swelling, trouble swallowing, neck pain, neck stiffness and voice change.   Eyes: Negative.   Respiratory: Negative for apnea, cough, choking, chest tightness, shortness of breath, wheezing and stridor.   Cardiovascular: Negative for chest pain, palpitations and leg swelling.  Gastrointestinal: Positive for abdominal pain (cramping), diarrhea and bloating. Negative for vomiting and flatus.  Genitourinary: Negative for dysuria, urgency, frequency, hematuria, decreased urine volume, enuresis and difficulty urinating.  Musculoskeletal: Positive for myalgias. Negative for back pain, joint swelling, arthralgias and gait problem.  Skin: Negative for color change, pallor, rash and wound.  Neurological: Negative for dizziness, tremors, seizures, syncope, facial asymmetry, speech difficulty, weakness, light-headedness, numbness and headaches.  Hematological: Negative for adenopathy. Does not bruise/bleed easily.  Psychiatric/Behavioral: Negative.          Objective:   Physical Exam  Vitals reviewed. Constitutional: He is oriented to person, place, and time. He appears well-developed and well-nourished. No distress.  HENT:  Head: Normocephalic and atraumatic.  Mouth/Throat: Oropharynx is clear and moist. No oropharyngeal exudate.  Eyes: Conjunctivae are normal. Right eye exhibits no discharge. Left eye exhibits no discharge. No scleral icterus.  Neck: Normal range of motion. Neck supple. No JVD present. No tracheal deviation present. No thyromegaly present.  Cardiovascular: Normal rate, regular rhythm, normal heart sounds and intact distal pulses.  Exam reveals no gallop and no friction rub.   No murmur heard. Pulmonary/Chest: Effort normal and breath sounds normal. No stridor. No respiratory distress. He has no wheezes. He has no rales. He exhibits no tenderness.  Abdominal: Soft. Bowel sounds are normal. He exhibits no distension and no mass. There is no tenderness. There is no rebound and no guarding.  Musculoskeletal: Normal range of motion. He exhibits no edema and no tenderness.  Lymphadenopathy:    He has no cervical adenopathy.  Neurological: He is oriented to person, place, and time. He displays normal reflexes. He exhibits normal muscle tone. Coordination normal.  Skin: Skin is warm and dry. No rash noted. He is not diaphoretic. No erythema. No pallor.  Psychiatric: He has a normal mood and affect. His behavior is normal. Judgment and thought content normal.      Lab Results  Component Value Date   WBC 5.5 04/04/2011   HGB 11.7* 04/04/2011   HCT 34.1* 04/04/2011   PLT 150 04/04/2011   GLUCOSE 86 04/05/2011   CHOL 134 04/04/2011   TRIG 143 04/04/2011   HDL 28* 04/04/2011   LDLDIRECT 52.5 11/30/2010   LDLCALC 77 04/04/2011   ALT 33 04/05/2011   AST 43* 04/05/2011   NA 143  04/05/2011   K 3.2* 04/05/2011   CL 108 04/05/2011   CREATININE 0.65 04/05/2011   BUN 4* 04/05/2011   CO2 26 04/05/2011   TSH 1.580 04/03/2011   INR 1.16  04/02/2011   HGBA1C 5.3 04/04/2011      Assessment & Plan:

## 2011-04-16 NOTE — Assessment & Plan Note (Signed)
When I last saw him he was admitted and found to be in rhabdo and did well with IV fluids, he tells me that the pravachol was stopped and he is doing much better, today I will recheck his CPK level and will monitor his renal function

## 2011-04-16 NOTE — Assessment & Plan Note (Signed)
His last set of labs showed some hypokalemia, today I will recheck his K+ level and will monitor his lytes and renal function, his BP is well controlled

## 2011-04-19 ENCOUNTER — Ambulatory Visit: Payer: 59

## 2011-04-19 DIAGNOSIS — R197 Diarrhea, unspecified: Secondary | ICD-10-CM

## 2011-04-23 LAB — PANCREATIC ELASTASE, FECAL: Pancreatic Elastase-1, Stool: 377 mcg/g

## 2011-04-26 ENCOUNTER — Telehealth: Payer: Self-pay | Admitting: Gastroenterology

## 2011-04-26 ENCOUNTER — Telehealth: Payer: Self-pay | Admitting: *Deleted

## 2011-04-26 MED ORDER — HYOSCYAMINE SULFATE ER 0.375 MG PO TB12: 0.3750 mg | ORAL_TABLET | Freq: Two times a day (BID) | ORAL | Status: DC | PRN

## 2011-04-26 NOTE — Telephone Encounter (Signed)
Rx sent to pharmacy for pt.  

## 2011-04-26 NOTE — Telephone Encounter (Signed)
Patient requesting to extend out of work note to return to work Nov 5 th. He continues to have loose stools and reflux problems. I advised patient to call GI and f/u regarding continued symptoms, he agreed but says that Dr Yetta Barre originally wrote him out of work so he needs to be the one to extend the note. OK for work note, return to work Nov 5 th?

## 2011-04-26 NOTE — Telephone Encounter (Signed)
Letter pending signature

## 2011-04-26 NOTE — Telephone Encounter (Signed)
yes

## 2011-04-26 NOTE — Telephone Encounter (Signed)
hymax 0.375mg  bid prn #25

## 2011-04-26 NOTE — Telephone Encounter (Signed)
Pt was seen by Dr. Arlyce Dice 04/12/11 for a flex-sig, was placed on Lialda 2.4grams with breakfast. States that 3 days ago he started having bad stomach cramps and nausea. Pt would like something to help with the cramping and nausea. Dr. Arlyce Dice please advise.

## 2011-04-27 ENCOUNTER — Telehealth: Payer: Self-pay | Admitting: Gastroenterology

## 2011-04-27 NOTE — Telephone Encounter (Signed)
Spoke with [facility] pharmacy and they received the rx that was sent in yesterday. Pharmacy states that the pt called for a refill for his clonazepam and they will not refill this because he just got it filled on 04/21/11. Pt aware.

## 2011-04-27 NOTE — Telephone Encounter (Signed)
Pt notified to pick up 

## 2011-05-12 ENCOUNTER — Ambulatory Visit: Payer: 59 | Admitting: Gastroenterology

## 2011-06-10 ENCOUNTER — Ambulatory Visit (INDEPENDENT_AMBULATORY_CARE_PROVIDER_SITE_OTHER): Payer: 59 | Admitting: Internal Medicine

## 2011-06-10 ENCOUNTER — Inpatient Hospital Stay (HOSPITAL_COMMUNITY)
Admission: EM | Admit: 2011-06-10 | Discharge: 2011-06-13 | DRG: 557 | Disposition: A | Discharge status: Home / Self Care | Payer: 59 | Attending: Internal Medicine | Admitting: Internal Medicine

## 2011-06-10 ENCOUNTER — Encounter (HOSPITAL_COMMUNITY): Payer: Self-pay | Admitting: Emergency Medicine

## 2011-06-10 ENCOUNTER — Encounter: Payer: Self-pay | Admitting: Internal Medicine

## 2011-06-10 ENCOUNTER — Other Ambulatory Visit (INDEPENDENT_AMBULATORY_CARE_PROVIDER_SITE_OTHER): Payer: 59

## 2011-06-10 ENCOUNTER — Emergency Department (HOSPITAL_COMMUNITY): Payer: 59

## 2011-06-10 VITALS — BP 136/88 | HR 95 | Temp 98.0°F | Resp 16 | Wt 164.5 lb

## 2011-06-10 DIAGNOSIS — M6282 Rhabdomyolysis: Principal | ICD-10-CM

## 2011-06-10 DIAGNOSIS — R609 Edema, unspecified: Secondary | ICD-10-CM

## 2011-06-10 DIAGNOSIS — I2510 Atherosclerotic heart disease of native coronary artery without angina pectoris: Secondary | ICD-10-CM

## 2011-06-10 DIAGNOSIS — F17200 Nicotine dependence, unspecified, uncomplicated: Secondary | ICD-10-CM

## 2011-06-10 DIAGNOSIS — I219 Acute myocardial infarction, unspecified: Secondary | ICD-10-CM

## 2011-06-10 DIAGNOSIS — F411 Generalized anxiety disorder: Secondary | ICD-10-CM

## 2011-06-10 DIAGNOSIS — R945 Abnormal results of liver function studies: Secondary | ICD-10-CM | POA: Diagnosis present

## 2011-06-10 DIAGNOSIS — M545 Low back pain, unspecified: Secondary | ICD-10-CM | POA: Diagnosis present

## 2011-06-10 DIAGNOSIS — I509 Heart failure, unspecified: Secondary | ICD-10-CM | POA: Diagnosis not present

## 2011-06-10 DIAGNOSIS — I5020 Unspecified systolic (congestive) heart failure: Secondary | ICD-10-CM | POA: Diagnosis not present

## 2011-06-10 DIAGNOSIS — F4322 Adjustment disorder with anxiety: Secondary | ICD-10-CM

## 2011-06-10 DIAGNOSIS — F419 Anxiety disorder, unspecified: Secondary | ICD-10-CM

## 2011-06-10 DIAGNOSIS — Z79899 Other long term (current) drug therapy: Secondary | ICD-10-CM

## 2011-06-10 DIAGNOSIS — F329 Major depressive disorder, single episode, unspecified: Secondary | ICD-10-CM

## 2011-06-10 DIAGNOSIS — F410 Panic disorder [episodic paroxysmal anxiety] without agoraphobia: Secondary | ICD-10-CM

## 2011-06-10 DIAGNOSIS — F418 Other specified anxiety disorders: Secondary | ICD-10-CM

## 2011-06-10 DIAGNOSIS — E785 Hyperlipidemia, unspecified: Secondary | ICD-10-CM

## 2011-06-10 DIAGNOSIS — D539 Nutritional anemia, unspecified: Secondary | ICD-10-CM

## 2011-06-10 DIAGNOSIS — Z800 Family history of malignant neoplasm of digestive organs: Secondary | ICD-10-CM

## 2011-06-10 DIAGNOSIS — I252 Old myocardial infarction: Secondary | ICD-10-CM

## 2011-06-10 DIAGNOSIS — R0989 Other specified symptoms and signs involving the circulatory and respiratory systems: Secondary | ICD-10-CM

## 2011-06-10 DIAGNOSIS — I10 Essential (primary) hypertension: Secondary | ICD-10-CM

## 2011-06-10 DIAGNOSIS — F341 Dysthymic disorder: Secondary | ICD-10-CM | POA: Diagnosis present

## 2011-06-10 DIAGNOSIS — R51 Headache: Secondary | ICD-10-CM | POA: Diagnosis present

## 2011-06-10 DIAGNOSIS — R7989 Other specified abnormal findings of blood chemistry: Secondary | ICD-10-CM

## 2011-06-10 DIAGNOSIS — R600 Localized edema: Secondary | ICD-10-CM | POA: Insufficient documentation

## 2011-06-10 DIAGNOSIS — F319 Bipolar disorder, unspecified: Secondary | ICD-10-CM | POA: Diagnosis present

## 2011-06-10 DIAGNOSIS — R9431 Abnormal electrocardiogram [ECG] [EKG]: Secondary | ICD-10-CM

## 2011-06-10 DIAGNOSIS — K219 Gastro-esophageal reflux disease without esophagitis: Secondary | ICD-10-CM

## 2011-06-10 DIAGNOSIS — R413 Other amnesia: Secondary | ICD-10-CM

## 2011-06-10 DIAGNOSIS — R1310 Dysphagia, unspecified: Secondary | ICD-10-CM

## 2011-06-10 DIAGNOSIS — Z23 Encounter for immunization: Secondary | ICD-10-CM

## 2011-06-10 DIAGNOSIS — Z7982 Long term (current) use of aspirin: Secondary | ICD-10-CM

## 2011-06-10 DIAGNOSIS — J9600 Acute respiratory failure, unspecified whether with hypoxia or hypercapnia: Secondary | ICD-10-CM | POA: Diagnosis not present

## 2011-06-10 DIAGNOSIS — F3289 Other specified depressive episodes: Secondary | ICD-10-CM

## 2011-06-10 DIAGNOSIS — E7800 Pure hypercholesterolemia, unspecified: Secondary | ICD-10-CM

## 2011-06-10 HISTORY — DX: Bipolar disorder, unspecified: F31.9

## 2011-06-10 HISTORY — DX: Rhabdomyolysis: M62.82

## 2011-06-10 LAB — POCT I-STAT 3, VENOUS BLOOD GAS (G3P V)
O2 Saturation: 92 %
pCO2, Ven: 38 mmHg — ABNORMAL LOW (ref 45.0–50.0)
pH, Ven: 7.426 — ABNORMAL HIGH (ref 7.250–7.300)

## 2011-06-10 LAB — URINALYSIS, ROUTINE W REFLEX MICROSCOPIC
Bilirubin Urine: NEGATIVE
Nitrite: NEGATIVE
Total Protein, Urine: NEGATIVE
Urine Glucose: NEGATIVE
pH: 6 (ref 5.0–8.0)

## 2011-06-10 LAB — CBC WITH DIFFERENTIAL/PLATELET
Basophils Absolute: 0 10*3/uL (ref 0.0–0.1)
Eosinophils Relative: 2.2 % (ref 0.0–5.0)
Hemoglobin: 12.8 g/dL — ABNORMAL LOW (ref 13.0–17.0)
Lymphocytes Relative: 17.2 % (ref 12.0–46.0)
Monocytes Relative: 7.2 % (ref 3.0–12.0)
Neutro Abs: 6.6 10*3/uL (ref 1.4–7.7)
Platelets: 183 10*3/uL (ref 150.0–400.0)
RDW: 14.4 % (ref 11.5–14.6)
WBC: 9 10*3/uL (ref 4.5–10.5)

## 2011-06-10 LAB — POCT I-STAT TROPONIN I: Troponin i, poc: 0.02 ng/mL (ref 0.00–0.08)

## 2011-06-10 LAB — CARDIAC PANEL(CRET KIN+CKTOT+MB+TROPI)
CK, MB: 14.7 ng/mL (ref 0.3–4.0)
Relative Index: 0.2 (ref 0.0–2.5)
Total CK: 6443 U/L — ABNORMAL HIGH (ref 7–232)
Troponin I: <0.3 ng/mL

## 2011-06-10 LAB — BASIC METABOLIC PANEL
CO2: 25 mEq/L (ref 19–32)
Chloride: 96 mEq/L (ref 96–112)
Glucose, Bld: 95 mg/dL (ref 70–99)
Potassium: 3.8 mEq/L (ref 3.5–5.1)
Sodium: 131 mEq/L — ABNORMAL LOW (ref 135–145)

## 2011-06-10 LAB — CBC
Hemoglobin: 12.5 g/dL — ABNORMAL LOW (ref 13.0–17.0)
Platelets: 166 10*3/uL (ref 150–400)
RBC: 3.64 MIL/uL — ABNORMAL LOW (ref 4.22–5.81)
WBC: 8.3 10*3/uL (ref 4.0–10.5)

## 2011-06-10 LAB — COMPREHENSIVE METABOLIC PANEL
ALT: 32 U/L (ref 0–53)
AST: 83 U/L — ABNORMAL HIGH (ref 0–37)
Albumin: 3.5 g/dL (ref 3.5–5.2)
Alkaline Phosphatase: 73 U/L (ref 39–117)
BUN: 7 mg/dL (ref 6–23)
Potassium: 4.5 mEq/L (ref 3.5–5.1)
Sodium: 132 mEq/L — ABNORMAL LOW (ref 135–145)

## 2011-06-10 LAB — CK: Total CK: 4994 U/L — ABNORMAL HIGH (ref 7–232)

## 2011-06-10 LAB — PRO B NATRIURETIC PEPTIDE: Pro B Natriuretic peptide (BNP): 247.8 pg/mL — ABNORMAL HIGH (ref 0–125)

## 2011-06-10 MED ORDER — ACETAMINOPHEN 650 MG RE SUPP: 650.0000 mg | Freq: Four times a day (QID) | RECTAL | Status: DC | PRN

## 2011-06-10 MED ORDER — METOPROLOL TARTRATE 25 MG PO TABS
25.0000 mg | ORAL_TABLET | Freq: Two times a day (BID) | ORAL | Status: DC
  Administered 2011-06-11 – 2011-06-12 (×5): 25 mg via ORAL
  Filled 2011-06-10 (×7): qty 1

## 2011-06-10 MED ORDER — CLONAZEPAM 2 MG PO TABS: 2.0000 mg | ORAL_TABLET | Freq: Two times a day (BID) | ORAL | Status: DC | PRN

## 2011-06-10 MED ORDER — HYDROMORPHONE HCL PF 1 MG/ML IJ SOLN
2.0000 mg | INTRAMUSCULAR | Status: DC | PRN
Start: 2011-06-10 — End: 2011-06-11
  Administered 2011-06-11 (×2): 2 mg via INTRAVENOUS
  Filled 2011-06-10 (×2): qty 2

## 2011-06-10 MED ORDER — ZOLPIDEM TARTRATE 5 MG PO TABS: 5.0000 mg | ORAL_TABLET | Freq: Every evening | ORAL | Status: DC | PRN

## 2011-06-10 MED ORDER — NICOTINE 7 MG/24HR TD PT24
7.0000 mg | MEDICATED_PATCH | Freq: Every day | TRANSDERMAL | Status: DC
  Administered 2011-06-11 – 2011-06-13 (×3): 7 mg via TRANSDERMAL
  Filled 2011-06-10 (×3): qty 1

## 2011-06-10 MED ORDER — HEPARIN SODIUM (PORCINE) 5000 UNIT/ML IJ SOLN
5000.0000 [IU] | Freq: Three times a day (TID) | INTRAMUSCULAR | Status: DC
  Administered 2011-06-11 – 2011-06-13 (×9): 5000 [IU] via SUBCUTANEOUS
  Filled 2011-06-10 (×11): qty 1

## 2011-06-10 MED ORDER — ONDANSETRON HCL 4 MG PO TABS
4.0000 mg | ORAL_TABLET | Freq: Four times a day (QID) | ORAL | Status: DC | PRN
  Administered 2011-06-11: 4 mg via ORAL
  Filled 2011-06-10: qty 1

## 2011-06-10 MED ORDER — ONDANSETRON HCL 4 MG/2ML IJ SOLN
4.0000 mg | Freq: Four times a day (QID) | INTRAMUSCULAR | Status: DC | PRN
  Administered 2011-06-12: 4 mg via INTRAVENOUS
  Filled 2011-06-10: qty 2

## 2011-06-10 MED ORDER — OXYCODONE HCL 5 MG PO TABS
5.0000 mg | ORAL_TABLET | ORAL | Status: DC | PRN
  Administered 2011-06-10: 5 mg via ORAL
  Filled 2011-06-10: qty 1

## 2011-06-10 MED ORDER — DIVALPROEX SODIUM ER 500 MG PO TB24
500.0000 mg | ORAL_TABLET | Freq: Three times a day (TID) | ORAL | Status: DC
  Administered 2011-06-11 – 2011-06-13 (×10): 500 mg via ORAL
  Filled 2011-06-10 (×13): qty 1

## 2011-06-10 MED ORDER — OLANZAPINE-FLUOXETINE HCL 12-50 MG PO CAPS
1.0000 | ORAL_CAPSULE | Freq: Every day | ORAL | Status: DC
  Administered 2011-06-11: 1 via ORAL
  Filled 2011-06-10 (×4): qty 1

## 2011-06-10 MED ORDER — SODIUM CHLORIDE 0.9 % IV BOLUS (SEPSIS): 500.0000 mL | Freq: Once | INTRAVENOUS | Status: DC

## 2011-06-10 MED ORDER — ASPIRIN 325 MG PO TABS
325.0000 mg | ORAL_TABLET | Freq: Every day | ORAL | Status: DC
  Administered 2011-06-11 – 2011-06-13 (×3): 325 mg via ORAL
  Filled 2011-06-10 (×3): qty 1

## 2011-06-10 MED ORDER — ACETAMINOPHEN 325 MG PO TABS
650.0000 mg | ORAL_TABLET | Freq: Four times a day (QID) | ORAL | Status: DC | PRN
  Administered 2011-06-11: 650 mg via ORAL
  Filled 2011-06-10: qty 2

## 2011-06-10 MED ORDER — SENNA 8.6 MG PO TABS
1.0000 | ORAL_TABLET | Freq: Two times a day (BID) | ORAL | Status: DC
  Administered 2011-06-11 – 2011-06-13 (×5): 8.6 mg via ORAL
  Filled 2011-06-10 (×7): qty 1

## 2011-06-10 MED ORDER — CLOPIDOGREL BISULFATE 75 MG PO TABS
75.0000 mg | ORAL_TABLET | Freq: Every day | ORAL | Status: DC
  Administered 2011-06-11 – 2011-06-13 (×3): 75 mg via ORAL
  Filled 2011-06-10 (×3): qty 1

## 2011-06-10 MED ORDER — SODIUM CHLORIDE 0.9 % IV SOLN
INTRAVENOUS | Status: DC
  Administered 2011-06-11: via INTRAVENOUS

## 2011-06-10 MED ORDER — DOCUSATE SODIUM 100 MG PO CAPS
100.0000 mg | ORAL_CAPSULE | Freq: Two times a day (BID) | ORAL | Status: DC
  Administered 2011-06-11 – 2011-06-13 (×5): 100 mg via ORAL
  Filled 2011-06-10 (×7): qty 1

## 2011-06-10 MED ORDER — CLONAZEPAM 1 MG PO TABS
2.0000 mg | ORAL_TABLET | Freq: Two times a day (BID) | ORAL | Status: DC
  Administered 2011-06-11 – 2011-06-13 (×5): 2 mg via ORAL
  Filled 2011-06-10 (×5): qty 2

## 2011-06-10 MED ORDER — GABAPENTIN 600 MG PO TABS
600.0000 mg | ORAL_TABLET | Freq: Three times a day (TID) | ORAL | Status: DC
  Administered 2011-06-11 – 2011-06-13 (×9): 600 mg via ORAL
  Filled 2011-06-10 (×10): qty 1

## 2011-06-10 NOTE — Assessment & Plan Note (Signed)
His Bp is well controlled

## 2011-06-10 NOTE — Assessment & Plan Note (Signed)
Klonopin refilled at his request

## 2011-06-10 NOTE — Patient Instructions (Signed)
Edema Edema is an abnormal build-up of fluids in tissues. Because this is partly dependent on gravity (water flows to the lowest place), it is more common in the leg sand thighs (lower extremities). It is also common in the looser tissues, like around the eyes. Painless swelling of the feet and ankles is common and increases as a person ages. It may affect both legs and may include the calves or even thighs. When squeezed, the fluid may move out of the affected area and may leave a dent for a few moments. CAUSES   Prolonged standing or sitting in one place for extended periods of time. Movement helps pump tissue fluid into the veins, and absence of movement prevents this, resulting in edema.   Varicose veins. The valves in the veins do not work as well as they should. This causes fluid to leak into the tissues.   Fluid and salt overload.   Injury, burn, or surgery to the leg, ankle, or foot, may damage veins and allow fluid to leak out.   Sunburn damages vessels. Leaky vessels allow fluid to go out into the sunburned tissues.   Allergies (from insect bites or stings, medications or chemicals) cause swelling by allowing vessels to become leaky.   Protein in the blood helps keep fluid in your vessels. Low protein, as in malnutrition, allows fluid to leak out.   Hormonal changes, including pregnancy and menstruation, cause fluid retention. This fluid may leak out of vessels and cause edema.   Medications that cause fluid retention. Examples are sex hormones, blood pressure medications, steroid treatment, or anti-depressants.   Some illnesses cause edema, especially heart failure, kidney disease, or liver disease.   Surgery that cuts veins or lymph nodes, such as surgery done for the heart or for breast cancer, may result in edema.  DIAGNOSIS  Your caregiver is usually easily able to determine what is causing your swelling (edema) by simply asking what is wrong (getting a history) and examining  you (doing a physical). Sometimes x-rays, EKG (electrocardiogram or heart tracing), and blood work may be done to evaluate for underlying medical illness. TREATMENT  General treatment includes:  Leg elevation (or elevation of the affected body part).   Restriction of fluid intake.   Prevention of fluid overload.   Compression of the affected body part. Compression with elastic bandages or support stockings squeezes the tissues, preventing fluid from entering and forcing it back into the blood vessels.   Diuretics (also called water pills or fluid pills) pull fluid out of your body in the form of increased urination. These are effective in reducing the swelling, but can have side effects and must be used only under your caregiver's supervision. Diuretics are appropriate only for some types of edema.  The specific treatment can be directed at any underlying causes discovered. Heart, liver, or kidney disease should be treated appropriately. HOME CARE INSTRUCTIONS   Elevate the legs (or affected body part) above the level of the heart, while lying down.   Avoid sitting or standing still for prolonged periods of time.   Avoid putting anything directly under the knees when lying down, and do not wear constricting clothing or garters on the upper legs.   Exercising the legs causes the fluid to work back into the veins and lymphatic channels. This may help the swelling go down.   The pressure applied by elastic bandages or support stockings can help reduce ankle swelling.   A low-salt diet may help reduce fluid   retention and decrease the ankle swelling.   Take any medications exactly as prescribed.  SEEK MEDICAL CARE IF:  Your edema is not responding to recommended treatments. SEEK IMMEDIATE MEDICAL CARE IF:   You develop shortness of breath or chest pain.   You cannot breathe when you lay down; or if, while lying down, you have to get up and go to the window to get your breath.   You  are having increasing swelling without relief from treatment.   You develop a fever over 102 F (38.9 C).   You develop pain or redness in the areas that are swollen.   Tell your caregiver right away if you have gained 3 lb/1.4 kg in 1 day or 5 lb/2.3 kg in a week.  MAKE SURE YOU:   Understand these instructions.   Will watch your condition.   Will get help right away if you are not doing well or get worse.  Document Released: 06/14/2005 Document Revised: 02/24/2011 Document Reviewed: 01/31/2008 [employer] Patient Information 2012 [employer]. 
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
## 2011-06-10 NOTE — Progress Notes (Signed)
Subjective:    Patient ID: [name], male    DOB: 1961/02/27, 50 y.o.   MRN: [id number]  Shortness of Breath This is a new problem. Episode onset: for 3 weeks. The problem occurs intermittently. The problem has been unchanged. Associated symptoms include leg swelling. Pertinent negatives include no abdominal pain, chest pain, claudication, coryza, ear pain, fever, headaches, hemoptysis, leg pain, neck pain, orthopnea, PND, rash, rhinorrhea, sore throat, sputum production, swollen glands, syncope, vomiting or wheezing. Exacerbated by: advil. Risk factors include smoking. He has tried nothing for the symptoms. His past medical history is significant for CAD.      Review of Systems  Constitutional: Positive for fatigue and unexpected weight change (weight gain). Negative for fever, chills, diaphoresis, activity change and appetite change.  HENT: Positive for facial swelling. Negative for ear pain, congestion, sore throat, rhinorrhea, sneezing, trouble swallowing, neck pain, neck stiffness, dental problem, voice change, postnasal drip and sinus pressure.   Eyes: Negative.   Respiratory: Positive for shortness of breath. Negative for apnea, cough, hemoptysis, sputum production, choking, chest tightness, wheezing and stridor.   Cardiovascular: Positive for leg swelling. Negative for chest pain, palpitations, orthopnea, claudication, syncope and PND.  Gastrointestinal: Positive for nausea. Negative for vomiting, abdominal pain, diarrhea, blood in stool, abdominal distention and anal bleeding.  Genitourinary: Positive for flank pain (bilateral).  Musculoskeletal: Positive for myalgias (in both legs), back pain and arthralgias. Negative for joint swelling and gait problem.  Skin: Negative for color change, pallor, rash and wound.  Neurological: Positive for weakness. Negative for dizziness, tremors, seizures, syncope, facial asymmetry, speech difficulty, light-headedness, numbness and headaches.    Hematological: Negative for adenopathy. Does not bruise/bleed easily.  Psychiatric/Behavioral: Negative for suicidal ideas, hallucinations, behavioral problems, confusion, sleep disturbance, self-injury, dysphoric mood, decreased concentration and agitation. The patient is nervous/anxious. The patient is not hyperactive.        Objective:   Physical Exam  Constitutional: He is oriented to person, place, and time. He appears lethargic.  Non-toxic appearance. He has a sickly appearance. He appears ill. He appears distressed.  HENT:  Head: Normocephalic and atraumatic.  Mouth/Throat: Oropharynx is clear and moist. No oropharyngeal exudate.  Eyes: Conjunctivae are normal. Right eye exhibits no discharge. Left eye exhibits no discharge. No scleral icterus.  Neck: Normal range of motion. Neck supple. No JVD present. No tracheal deviation present. No thyromegaly present.  Cardiovascular: Normal rate, regular rhythm, normal heart sounds and intact distal pulses.  Exam reveals no gallop and no friction rub.   No murmur heard. Pulmonary/Chest: Effort normal and breath sounds normal. No stridor. No respiratory distress. He has no wheezes. He has no rales. He exhibits no tenderness.  Abdominal: Soft. Bowel sounds are normal. He exhibits no distension and no mass. There is no tenderness. There is no rebound and no guarding.  Musculoskeletal: Normal range of motion. He exhibits edema (2+ edema in both legs). He exhibits no tenderness.  Lymphadenopathy:    He has no cervical adenopathy.  Neurological: He is oriented to person, place, and time. He appears lethargic.  Skin: Skin is warm and dry. No rash noted. No erythema. No pallor.  Psychiatric: He has a normal mood and affect. His behavior is normal. Judgment normal.      Lab Results  Component Value Date   WBC 5.5 04/04/2011   HGB 11.7* 04/04/2011   HCT 34.1* 04/04/2011   PLT 150 04/04/2011   GLUCOSE 93 04/16/2011   CHOL 134 04/04/2011   TRIG  143  04/04/2011   HDL 28* 04/04/2011   LDLDIRECT 52.5 11/30/2010   LDLCALC 77 04/04/2011   ALT 12 04/16/2011   AST 20 04/16/2011   NA 144 04/16/2011   K 3.9 04/16/2011   CL 108 04/16/2011   CREATININE 0.9 04/16/2011   BUN 4* 04/16/2011   CO2 27 04/16/2011   TSH 1.580 04/03/2011   INR 1.16 04/02/2011   HGBA1C 5.3 04/04/2011      Assessment & Plan:
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
## 2011-06-10 NOTE — Assessment & Plan Note (Signed)
His CPK is high again, nearly 5000, so I have asked him to report to the ER as I think the edema is a bad sign that he may have imminent renal failure and he would benefit from IV fluids with bicarb to prevent further renal damage

## 2011-06-10 NOTE — ED Notes (Signed)
Admitting MD with PT. Awaiting als transporter.

## 2011-06-10 NOTE — ED Notes (Signed)
Pt st's he went to see his MD today. St's has been retaining fluid, also has had some chest pain with shortness of breath.  Pt st's after getting home pt was called and told to come to the ED ref. ? Enzymes being elevated.

## 2011-06-10 NOTE — Assessment & Plan Note (Signed)
I ordered extensive labs to see what is causing this and after review it appears that the nsaids he has been taking + the early evidence of rhabdo raise concern about renal insufficiency

## 2011-06-10 NOTE — Assessment & Plan Note (Signed)
His EKG is unchanged and there is no sign of pulmonary edema at this time

## 2011-06-11 ENCOUNTER — Inpatient Hospital Stay (HOSPITAL_COMMUNITY): Payer: 59

## 2011-06-11 ENCOUNTER — Encounter (HOSPITAL_COMMUNITY): Payer: Self-pay | Admitting: Radiology

## 2011-06-11 LAB — CBC
MCH: 33.9 pg (ref 26.0–34.0)
MCHC: 33.6 g/dL (ref 30.0–36.0)
MCV: 100.8 fL — ABNORMAL HIGH (ref 78.0–100.0)
Platelets: 165 10*3/uL (ref 150–400)
RBC: 3.66 MIL/uL — ABNORMAL LOW (ref 4.22–5.81)

## 2011-06-11 LAB — COMPREHENSIVE METABOLIC PANEL
ALT: 36 U/L (ref 0–53)
AST: 84 U/L — ABNORMAL HIGH (ref 0–37)
Albumin: 3.2 g/dL — ABNORMAL LOW (ref 3.5–5.2)
CO2: 27 mEq/L (ref 19–32)
Chloride: 100 mEq/L (ref 96–112)
Creatinine, Ser: 0.71 mg/dL (ref 0.50–1.35)
GFR calc non Af Amer: >90 mL/min (ref >90)
Potassium: 4 mEq/L (ref 3.5–5.1)
Sodium: 136 mEq/L (ref 135–145)
Total Bilirubin: 0.3 mg/dL (ref 0.3–1.2)

## 2011-06-11 LAB — CARDIAC PANEL(CRET KIN+CKTOT+MB+TROPI)
CK, MB: 9.6 ng/mL (ref 0.3–4.0)
CK, MB: 5.5 ng/mL — ABNORMAL HIGH (ref 0.3–4.0)
Relative Index: 0.2 (ref 0.0–2.5)
Total CK: 3596 U/L — ABNORMAL HIGH (ref 7–232)
Troponin I: <0.3 ng/mL (ref <0.30)
Troponin I: <0.3 ng/mL (ref <0.30)

## 2011-06-11 LAB — DRUGS OF ABUSE SCREEN W/O ALC, ROUTINE URINE
Benzodiazepines.: POSITIVE — AB
Creatinine,U: 139.7 mg/dL
Marijuana Metabolite: NEGATIVE
Phencyclidine (PCP): NEGATIVE
Propoxyphene: NEGATIVE

## 2011-06-11 LAB — BLOOD GAS, ARTERIAL
Acid-Base Excess: 4.4 mmol/L — ABNORMAL HIGH (ref 0.0–2.0)
Bicarbonate: 26.8 mEq/L — ABNORMAL HIGH (ref 20.0–24.0)
Bicarbonate: 27.8 mEq/L — ABNORMAL HIGH (ref 20.0–24.0)
Delivery systems: POSITIVE
Drawn by: 352351
FIO2: 0.5 %
Mode: POSITIVE
O2 Content: 2 L/min
O2 Saturation: 99.9 %
Patient temperature: 98.6
TCO2: 29 mmol/L (ref 0–100)
pCO2 arterial: 57.2 mmHg (ref 35.0–45.0)
pO2, Arterial: 75.4 mmHg — ABNORMAL LOW (ref 80.0–100.0)
pO2, Arterial: 209 mmHg — ABNORMAL HIGH (ref 80.0–100.0)

## 2011-06-11 LAB — SEDIMENTATION RATE: Sed Rate: 25 mm/hr — ABNORMAL HIGH (ref 0–16)

## 2011-06-11 LAB — IRON AND TIBC
Saturation Ratios: 10 % — ABNORMAL LOW (ref 20–55)
TIBC: 353 ug/dL (ref 215–435)

## 2011-06-11 LAB — RHEUMATOID FACTOR: Rhuematoid fact SerPl-aCnc: <10 IU/mL (ref <=14)

## 2011-06-11 LAB — VITAMIN B12: Vitamin B-12: 318 pg/mL (ref 211–911)

## 2011-06-11 LAB — GLUCOSE, CAPILLARY

## 2011-06-11 LAB — FERRITIN: Ferritin: 58 ng/mL (ref 22–322)

## 2011-06-11 LAB — RETICULOCYTES
RBC.: 3.57 MIL/uL — ABNORMAL LOW (ref 4.22–5.81)
Retic Ct Pct: 1.7 % (ref 0.4–3.1)

## 2011-06-11 LAB — PRO B NATRIURETIC PEPTIDE: Pro B Natriuretic peptide (BNP): 124 pg/mL (ref 0–125)

## 2011-06-11 LAB — C-REACTIVE PROTEIN: CRP: 9.17 mg/dL — ABNORMAL HIGH (ref <0.60)

## 2011-06-11 LAB — SJOGRENS SYNDROME-B EXTRACTABLE NUCLEAR ANTIBODY: SSB (La) (ENA) Antibody, IgG: <1 AU/mL (ref <30)

## 2011-06-11 LAB — ANA: Anti Nuclear Antibody(ANA): NEGATIVE

## 2011-06-11 MED ORDER — ACETAMINOPHEN 650 MG RE SUPP: 650.0000 mg | Freq: Four times a day (QID) | RECTAL | Status: DC | PRN

## 2011-06-11 MED ORDER — NALOXONE HCL 0.4 MG/ML IJ SOLN
INTRAMUSCULAR | Status: AC
  Administered 2011-06-11: 0.4 mg via INTRAVENOUS
  Filled 2011-06-11: qty 1

## 2011-06-11 MED ORDER — FUROSEMIDE 10 MG/ML IJ SOLN
60.0000 mg | Freq: Two times a day (BID) | INTRAMUSCULAR | Status: DC
  Administered 2011-06-11 – 2011-06-12 (×2): 60 mg via INTRAVENOUS
  Filled 2011-06-11 (×4): qty 6

## 2011-06-11 MED ORDER — FUROSEMIDE 10 MG/ML IJ SOLN
80.0000 mg | Freq: Once | INTRAMUSCULAR | Status: AC
  Administered 2011-06-11: 80 mg via INTRAVENOUS
  Filled 2011-06-11: qty 8

## 2011-06-11 MED ORDER — INFLUENZA VIRUS VACC SPLIT PF IM SUSP
0.5000 mL | INTRAMUSCULAR | Status: AC
  Filled 2011-06-11: qty 0.5

## 2011-06-11 MED ORDER — NALOXONE HCL 0.4 MG/ML IJ SOLN
INTRAMUSCULAR | Status: AC
  Filled 2011-06-11: qty 1

## 2011-06-11 MED ORDER — NALOXONE HCL 0.4 MG/ML IJ SOLN
0.4000 mg | INTRAMUSCULAR | Status: DC | PRN
  Administered 2011-06-11: 0.4 mg via INTRAVENOUS
  Filled 2011-06-11: qty 1

## 2011-06-11 MED ORDER — ACETAMINOPHEN 325 MG PO TABS: 650.0000 mg | ORAL_TABLET | Freq: Four times a day (QID) | ORAL | Status: DC | PRN

## 2011-06-11 MED ORDER — ACETAMINOPHEN 325 MG PO TABS
650.0000 mg | ORAL_TABLET | ORAL | Status: DC | PRN
  Administered 2011-06-11 – 2011-06-13 (×3): 650 mg via ORAL
  Filled 2011-06-11 (×4): qty 2

## 2011-06-11 MED ORDER — ACETAMINOPHEN 325 MG PO TABS
650.0000 mg | ORAL_TABLET | Freq: Four times a day (QID) | ORAL | Status: DC | PRN
  Administered 2011-06-11: 650 mg via ORAL
  Filled 2011-06-11: qty 2

## 2011-06-11 NOTE — Progress Notes (Signed)
06/11/11 1247  Flu vaccine given- (L) deltoid-unable to rechart-computer frozen and flu education given.  Leandrew Koyanagi Roxan Hockey, RN

## 2011-06-11 NOTE — Progress Notes (Signed)
Patient ID: [name], male   DOB: 07-25-1960, 50 y.o.   MRN: [id number] Subjective: Called to see patient said to be complaining of bilateral lower extremity swelling,headache and sob.Also said to have mild precordial discomfort.Patient initially admitted for rhabdo,although he denied any hx of trauma or fall.He also denied been on statins.  Objective: Weight change:   Intake/Output Summary (Last 24 hours) at 06/11/11 0807 Last data filed at 06/11/11 0700  Gross per 24 hour  Intake 651.67 ml  Output    800 ml  Net -148.33 ml   BP 107/77  Pulse 98  Temp(Src) 98.2 F (36.8 C) (Oral)  Resp 11  Ht 5\' 7"  (1.702 m)  Wt 74.662 kg (164 lb 9.6 oz)  BMI 25.78 kg/m2  SpO2 92% Physical Exam: General appearance: Drowsy and mildly short of breath evident by flaring of the ala nasal  Head: Normocephalic, without obvious abnormality, atraumatic Neck:Increase jvd(>1mm H20) no adenopathy, no carotid bruit, , supple, symmetrical, trachea midline and thyroid not enlarged, symmetric, no tenderness/mass/nodules Lungs: Rales all over the lung field Heart: regular rate and rhythm, S1, S2 normal, no murmur, click, rub or gallop Abdomen: soft, non-tender; bowel sounds normal; no masses,  no organomegaly Extremities: pedal  edema Skin: No ecchymoses  Lab Results: Results for orders placed during the hospital encounter of 06/10/11 (from the past 48 hour(s))  CBC     Status: Abnormal   Collection Time   06/10/11  4:35 PM      Component Value Range Comment   WBC 8.3  4.0 - 10.5 (K/uL)    RBC 3.64 (*) 4.22 - 5.81 (MIL/uL)    Hemoglobin 12.5 (*) 13.0 - 17.0 (g/dL)    HCT 40.9 (*) 81.1 - 52.0 (%)    MCV 97.8  78.0 - 100.0 (fL)    MCH 34.3 (*) 26.0 - 34.0 (pg)    MCHC 35.1  30.0 - 36.0 (g/dL)    RDW 91.4  78.2 - 95.6 (%)    Platelets 166  150 - 400 (K/uL)   BASIC METABOLIC PANEL     Status: Abnormal   Collection Time   06/10/11  4:35 PM      Component Value Range Comment   Sodium 131 (*) 135  - 145 (mEq/L)    Potassium 3.8  3.5 - 5.1 (mEq/L)    Chloride 96  96 - 112 (mEq/L)    CO2 25  19 - 32 (mEq/L)    Glucose, Bld 95  70 - 99 (mg/dL)    BUN 5 (*) 6 - 23 (mg/dL)    Creatinine, Ser 2.13  0.50 - 1.35 (mg/dL)    Calcium 9.0  8.4 - 10.5 (mg/dL)    GFR calc non Af Amer >90  >90 (mL/min)    GFR calc Af Amer >90  >90 (mL/min)   PRO B NATRIURETIC PEPTIDE     Status: Abnormal   Collection Time   06/10/11  4:40 PM      Component Value Range Comment   Pro B Natriuretic peptide (BNP) 247.8 (*) 0 - 125 (pg/mL)   POCT I-STAT TROPONIN I     Status: Normal   Collection Time   06/10/11  4:51 PM      Component Value Range Comment   Troponin i, poc 0.02  0.00 - 0.08 (ng/mL)    Comment 3            POCT I-STAT 3, BLOOD GAS (G3P V)     Status: Abnormal  Collection Time   06/10/11  6:34 PM      Component Value Range Comment   pH, Ven 7.426 (*) 7.250 - 7.300     pCO2, Ven 38.0 (*) 45.0 - 50.0 (mmHg)    pO2, Ven 63.0 (*) 30.0 - 45.0 (mmHg)    Bicarbonate 25.0 (*) 20.0 - 24.0 (mEq/L)    TCO2 26  0 - 100 (mmol/L)    O2 Saturation 92.0      Acid-Base Excess 1.0  0.0 - 2.0 (mmol/L)    Sample type VENOUS     CARDIAC PANEL(CRET KIN+CKTOT+MB+TROPI)     Status: Abnormal   Collection Time   06/10/11 10:21 PM      Component Value Range Comment   Total CK 6443 (*) 7 - 232 (U/L)    CK, MB 14.7 (*) 0.3 - 4.0 (ng/mL)    Troponin I <0.30  <0.30 (ng/mL)    Relative Index 0.2  0.0 - 2.5    VITAMIN B12     Status: Normal   Collection Time   06/11/11 12:04 AM      Component Value Range Comment   Vitamin B-12 318  211 - 911 (pg/mL)   FOLATE     Status: Normal   Collection Time   06/11/11 12:04 AM      Component Value Range Comment   Folate 17.7     IRON AND TIBC     Status: Abnormal   Collection Time   06/11/11 12:04 AM      Component Value Range Comment   Iron 35 (*) 42 - 135 (ug/dL)    TIBC 119  147 - 829 (ug/dL)    Saturation Ratios 10 (*) 20 - 55 (%)    UIBC 318  125 - 400 (ug/dL)     FERRITIN     Status: Normal   Collection Time   06/11/11 12:04 AM      Component Value Range Comment   Ferritin 58  22 - 322 (ng/mL)   RETICULOCYTES     Status: Abnormal   Collection Time   06/11/11 12:04 AM      Component Value Range Comment   Retic Ct Pct 1.7  0.4 - 3.1 (%)    RBC. 3.57 (*) 4.22 - 5.81 (MIL/uL)    Retic Count, Manual 60.7  19.0 - 186.0 (K/uL)   SEDIMENTATION RATE     Status: Abnormal   Collection Time   06/11/11 12:04 AM      Component Value Range Comment   Sed Rate 25 (*) 0 - 16 (mm/hr)   C-REACTIVE PROTEIN     Status: Abnormal   Collection Time   06/11/11 12:04 AM      Component Value Range Comment   CRP 9.17 (*) <0.60 (mg/dL)   RHEUMATOID FACTOR     Status: Normal   Collection Time   06/11/11 12:04 AM      Component Value Range Comment   Rheumatoid Factor <10  <=14 (IU/mL)   LACTATE DEHYDROGENASE     Status: Abnormal   Collection Time   06/11/11 12:04 AM      Component Value Range Comment   LD 362 (*) 94 - 250 (U/L)   BLOOD GAS, ARTERIAL     Status: Abnormal   Collection Time   06/11/11  5:55 AM      Component Value Range Comment   O2 Content 2.0      Delivery systems NASAL CANNULA      pH,  Arterial 7.292 (*) 7.350 - 7.450     pCO2 arterial 57.2 (*) 35.0 - 45.0 (mmHg)    pO2, Arterial 75.4 (*) 80.0 - 100.0 (mmHg)    Bicarbonate 26.8 (*) 20.0 - 24.0 (mEq/L)    TCO2 28.5  0 - 100 (mmol/L)    Acid-Base Excess 0.9  0.0 - 2.0 (mmol/L)    O2 Saturation 93.9      Patient temperature 98.6      Collection site LEFT RADIAL      Drawn by [id number]      Sample type ARTERIAL DRAW      Allens test (pass/fail) PASS  PASS    COMPREHENSIVE METABOLIC PANEL     Status: Abnormal   Collection Time   06/11/11  6:40 AM      Component Value Range Comment   Sodium 136  135 - 145 (mEq/L)    Potassium 4.0  3.5 - 5.1 (mEq/L)    Chloride 100  96 - 112 (mEq/L)    CO2 27  19 - 32 (mEq/L)    Glucose, Bld 125 (*) 70 - 99 (mg/dL)    BUN 5 (*) 6 - 23 (mg/dL)     Creatinine, Ser 0.45  0.50 - 1.35 (mg/dL)    Calcium 8.8  8.4 - 10.5 (mg/dL)    Total Protein 6.9  6.0 - 8.3 (g/dL)    Albumin 3.2 (*) 3.5 - 5.2 (g/dL)    AST 84 (*) 0 - 37 (U/L)    ALT 36  0 - 53 (U/L)    Alkaline Phosphatase 82  39 - 117 (U/L)    Total Bilirubin 0.3  0.3 - 1.2 (mg/dL)    GFR calc non Af Amer >90  >90 (mL/min)    GFR calc Af Amer >90  >90 (mL/min)   CBC     Status: Abnormal   Collection Time   06/11/11  6:40 AM      Component Value Range Comment   WBC 5.6  4.0 - 10.5 (K/uL)    RBC 3.66 (*) 4.22 - 5.81 (MIL/uL)    Hemoglobin 12.4 (*) 13.0 - 17.0 (g/dL)    HCT 40.9 (*) 81.1 - 52.0 (%)    MCV 100.8 (*) 78.0 - 100.0 (fL)    MCH 33.9  26.0 - 34.0 (pg)    MCHC 33.6  30.0 - 36.0 (g/dL)    RDW 91.4  78.2 - 95.6 (%)    Platelets 165  150 - 400 (K/uL)   CARDIAC PANEL(CRET KIN+CKTOT+MB+TROPI)     Status: Abnormal   Collection Time   06/11/11  6:43 AM      Component Value Range Comment   Total CK 5308 (*) 7 - 232 (U/L)    CK, MB 10.5 (*) 0.3 - 4.0 (ng/mL)    Troponin I <0.30  <0.30 (ng/mL)    Relative Index 0.2  0.0 - 2.5    GLUCOSE, CAPILLARY     Status: Abnormal   Collection Time   06/11/11  7:28 AM      Component Value Range Comment   Glucose-Capillary 103 (*) 70 - 99 (mg/dL)    Comment 1 Notify RN       Micro Results: No results found for this or any previous visit (from the past 240 hour(s)).  Studies/Results: Dg Chest 2 View  06/10/2011  *RADIOLOGY REPORT*  Clinical Data: Shortness of breath.  Chest pain  CHEST - 2 VIEW  Comparison: 04/02/2011  Findings: Hyperexpansion is consistent with emphysema.  The lungs are clear without focal infiltrate, edema, pneumothorax or pleural effusion.  Linear scarring is seen at the left lung base. The cardiopericardial silhouette is within normal limits for size.  IMPRESSION: Stable.  No acute findings.  Original Report Authenticated By: ERIC A. MANSELL, M.D.   Medications: Scheduled Meds:   . aspirin  325 mg Oral Daily    . clonazePAM  2 mg Oral BID  . clopidogrel  75 mg Oral Daily  . divalproex  500 mg Oral TID PC & HS  . docusate sodium  100 mg Oral BID  . furosemide  60 mg Intravenous Q12H  . furosemide  80 mg Intravenous Once  . gabapentin  600 mg Oral TID  . heparin  5,000 Units Subcutaneous Q8H  . influenza  inactive virus vaccine  0.5 mL Intramuscular Tomorrow-1000  . metoprolol tartrate  25 mg Oral BID  . naloxone      . nicotine  7 mg Transdermal Daily  . olanzapine-FLUoxetine  1 capsule Oral QHS  . senna  1 tablet Oral BID  . DISCONTD: sodium chloride  500 mL Intravenous Once   Continuous Infusions:   . DISCONTD: sodium chloride 100 mL/hr at 06/11/11 0029   PRN Meds:.acetaminophen, acetaminophen, naloxone (NARCAN) injection, ondansetron (ZOFRAN) IV, ondansetron, DISCONTD: HYDROmorphone, DISCONTD: oxyCODONE, DISCONTD: zolpidem  Assessment/Plan: #1 Acute Respiratory failure-most likely secondary to pulmonary congestion.ABG done showed hypercapnic respiratory failure.Plan is to start Bipap and move patient to step down unit. #2 Congestive Heart Fialure-will start diuresis and add lisinopril to regimen.will order 2d echo #3 hx of CAD #4 Persistent Headache-will give acetaminophen for now since patient was said to have become delirious when he was given dilaudid and subsequently was given narcan.Will order ct head without contrast since the headache is said to be persistent. #5 Hx of Macrocytic Anemia #6 Gerd-will give protonix #7 Hx of Depression and Anxiety Disorder Please transfer patient to step down unit    LOS: 1 day   Kailyn Vanderslice 06/11/2011, 8:07 AM

## 2011-06-11 NOTE — Progress Notes (Signed)
Utilization Review Completed.[name] T12/14/2012   

## 2011-06-11 NOTE — Plan of Care (Signed)
Problem: Phase I Progression Outcomes Goal: Pain controlled with appropriate interventions Outcome: Not Progressing Narcotics d/c'd due to pt. Previous difficulty breathing.
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
## 2011-06-11 NOTE — Progress Notes (Signed)
Pt complained of severe headache last night. RN administered 5 mg oxycodone IR at 2220. Pt's pain was mostly unrelieved. Administered 2mg  Dilaudid at 0028. Pt's pain was somewhat relieved but he reported that it worsened by 0428, and requested more Dilaudid. This was administered, and the patient fell asleep almost immediately. His respiration rate slowed to 10 breaths/minute. He was arousable but would fall asleep suddenly. Charge nurse, rapid response and on call NP were alerted and assessed the patient. Orders given and carried out including Narcan for 0700. NP ordered transfer to stepdown bed for closer monitoring, but no beds were available so the patient remained on the floor to be closely supervised.   Idalia Needle RN.

## 2011-06-12 LAB — COMPREHENSIVE METABOLIC PANEL
ALT: 42 U/L (ref 0–53)
Albumin: 2.9 g/dL — ABNORMAL LOW (ref 3.5–5.2)
Alkaline Phosphatase: 97 U/L (ref 39–117)
BUN: 8 mg/dL (ref 6–23)
Chloride: 99 mEq/L (ref 96–112)
Potassium: 3.9 mEq/L (ref 3.5–5.1)
Sodium: 138 mEq/L (ref 135–145)
Total Bilirubin: 0.3 mg/dL (ref 0.3–1.2)
Total Protein: 6.8 g/dL (ref 6.0–8.3)

## 2011-06-12 LAB — CBC
HCT: 39.1 % (ref 39.0–52.0)
Hemoglobin: 13.1 g/dL (ref 13.0–17.0)
MCHC: 33.5 g/dL (ref 30.0–36.0)
RDW: 13.4 % (ref 11.5–15.5)
WBC: 4.3 10*3/uL (ref 4.0–10.5)

## 2011-06-12 LAB — CARDIAC PANEL(CRET KIN+CKTOT+MB+TROPI): Total CK: 2612 U/L — ABNORMAL HIGH (ref 7–232)

## 2011-06-12 MED ORDER — OLANZAPINE-FLUOXETINE HCL 6-25 MG PO CAPS
2.0000 | ORAL_CAPSULE | Freq: Every day | ORAL | Status: DC
  Administered 2011-06-12: 2 via ORAL
  Filled 2011-06-12 (×2): qty 2

## 2011-06-12 MED ORDER — LISINOPRIL 5 MG PO TABS
5.0000 mg | ORAL_TABLET | Freq: Every day | ORAL | Status: DC
  Administered 2011-06-12: 5 mg via ORAL
  Filled 2011-06-12 (×2): qty 1

## 2011-06-12 MED ORDER — OXYCODONE HCL 5 MG PO TABS
5.0000 mg | ORAL_TABLET | ORAL | Status: DC | PRN
  Administered 2011-06-12: 5 mg via ORAL
  Filled 2011-06-12: qty 1

## 2011-06-12 MED ORDER — FUROSEMIDE 40 MG PO TABS
40.0000 mg | ORAL_TABLET | Freq: Every day | ORAL | Status: DC
Start: 2011-06-12 — End: 2011-06-13
  Administered 2011-06-12: 40 mg via ORAL
  Filled 2011-06-12 (×2): qty 1

## 2011-06-12 NOTE — Progress Notes (Signed)
Patient ID: [name], male   DOB: 10/03/1960, 50 y.o.   MRN: [id number] Patient ID: [name], male   DOB: [birth year], 50 y.o.   MRN: [id number] Subjective: Patient seen.Feels better and denies any complaints.  Objective: Weight change:   Intake/Output Summary (Last 24 hours) at 06/12/11 1540 Last data filed at 06/12/11 1407  Gross per 24 hour  Intake    920 ml  Output      3 ml  Net    917 ml   BP 97/63  Pulse 75  Temp(Src) 97.9 F (36.6 C) (Oral)  Resp 20  Ht 5\' 7"  (1.702 m)  Wt 74.662 kg (164 lb 9.6 oz)  BMI 25.78 kg/m2  SpO2 95% Physical Exam: General appearance:alert,not in distress Head: Normocephalic, without obvious abnormality, atraumatic Neck:no jvd Lungs: clear Heart: regular rate and rhythm, S1, S2 normal, no murmur, click, rub or gallop Abdomen: soft, non-tender; bowel sounds normal; no masses,  no organomegaly Extremities:minimal pedal  edema Skin: No ecchymoses  Lab Results: Results for orders placed during the hospital encounter of 06/10/11 (from the past 48 hour(s))  CBC     Status: Abnormal   Collection Time   06/10/11  4:35 PM      Component Value Range Comment   WBC 8.3  4.0 - 10.5 (K/uL)    RBC 3.64 (*) 4.22 - 5.81 (MIL/uL)    Hemoglobin 12.5 (*) 13.0 - 17.0 (g/dL)    HCT 78.2 (*) 95.6 - 52.0 (%)    MCV 97.8  78.0 - 100.0 (fL)    MCH 34.3 (*) 26.0 - 34.0 (pg)    MCHC 35.1  30.0 - 36.0 (g/dL)    RDW 21.3  08.6 - 57.8 (%)    Platelets 166  150 - 400 (K/uL)   BASIC METABOLIC PANEL     Status: Abnormal   Collection Time   06/10/11  4:35 PM      Component Value Range Comment   Sodium 131 (*) 135 - 145 (mEq/L)    Potassium 3.8  3.5 - 5.1 (mEq/L)    Chloride 96  96 - 112 (mEq/L)    CO2 25  19 - 32 (mEq/L)    Glucose, Bld 95  70 - 99 (mg/dL)    BUN 5 (*) 6 - 23 (mg/dL)    Creatinine, Ser 4.69  0.50 - 1.35 (mg/dL)    Calcium 9.0  8.4 - 10.5 (mg/dL)    GFR calc non Af Amer >90  >90 (mL/min)    GFR calc Af Amer >90  >90 (mL/min)     PRO B NATRIURETIC PEPTIDE     Status: Abnormal   Collection Time   06/10/11  4:40 PM      Component Value Range Comment   Pro B Natriuretic peptide (BNP) 247.8 (*) 0 - 125 (pg/mL)   POCT I-STAT TROPONIN I     Status: Normal   Collection Time   06/10/11  4:51 PM      Component Value Range Comment   Troponin i, poc 0.02  0.00 - 0.08 (ng/mL)    Comment 3            POCT I-STAT 3, BLOOD GAS (G3P V)     Status: Abnormal   Collection Time   06/10/11  6:34 PM      Component Value Range Comment   pH, Ven 7.426 (*) 7.250 - 7.300     pCO2, Ven 38.0 (*) 45.0 - 50.0 (mmHg)  pO2, Ven 63.0 (*) 30.0 - 45.0 (mmHg)    Bicarbonate 25.0 (*) 20.0 - 24.0 (mEq/L)    TCO2 26  0 - 100 (mmol/L)    O2 Saturation 92.0      Acid-Base Excess 1.0  0.0 - 2.0 (mmol/L)    Sample type VENOUS     CARDIAC PANEL(CRET KIN+CKTOT+MB+TROPI)     Status: Abnormal   Collection Time   06/10/11 10:21 PM      Component Value Range Comment   Total CK 6443 (*) 7 - 232 (U/L)    CK, MB 14.7 (*) 0.3 - 4.0 (ng/mL)    Troponin I <0.30  <0.30 (ng/mL)    Relative Index 0.2  0.0 - 2.5    ANTI-SMITH ANTIBODY     Status: Normal   Collection Time   06/10/11 11:56 PM      Component Value Range Comment   ENA <1  <30 (AU/mL)   VITAMIN B12     Status: Normal   Collection Time   06/11/11 12:04 AM      Component Value Range Comment   Vitamin B-12 318  211 - 911 (pg/mL)   FOLATE     Status: Normal   Collection Time   06/11/11 12:04 AM      Component Value Range Comment   Folate 17.7     IRON AND TIBC     Status: Abnormal   Collection Time   06/11/11 12:04 AM      Component Value Range Comment   Iron 35 (*) 42 - 135 (ug/dL)    TIBC 161  096 - 045 (ug/dL)    Saturation Ratios 10 (*) 20 - 55 (%)    UIBC 318  125 - 400 (ug/dL)   FERRITIN     Status: Normal   Collection Time   06/11/11 12:04 AM      Component Value Range Comment   Ferritin 58  22 - 322 (ng/mL)   RETICULOCYTES     Status: Abnormal   Collection Time    06/11/11 12:04 AM      Component Value Range Comment   Retic Ct Pct 1.7  0.4 - 3.1 (%)    RBC. 3.57 (*) 4.22 - 5.81 (MIL/uL)    Retic Count, Manual 60.7  19.0 - 186.0 (K/uL)   SEDIMENTATION RATE     Status: Abnormal   Collection Time   06/11/11 12:04 AM      Component Value Range Comment   Sed Rate 25 (*) 0 - 16 (mm/hr)   C-REACTIVE PROTEIN     Status: Abnormal   Collection Time   06/11/11 12:04 AM      Component Value Range Comment   CRP 9.17 (*) <0.60 (mg/dL)   ANA     Status: Normal   Collection Time   06/11/11 12:04 AM      Component Value Range Comment   ANA NEGATIVE  NEGATIVE    RHEUMATOID FACTOR     Status: Normal   Collection Time   06/11/11 12:04 AM      Component Value Range Comment   Rheumatoid Factor <10  <=14 (IU/mL)   JO-1 ANTIBODY-IGG     Status: Normal   Collection Time   06/11/11 12:04 AM      Component Value Range Comment   Jo-1 Antibody, IgG 1  <30 (AU/mL)   SJOGRENS SYNDROME-A EXTRACTABLE NUCLEAR ANTIBODY     Status: Normal   Collection Time   06/11/11 12:04 AM  Component Value Range Comment   SSA (Ro) (ENA) Antibody, IgG 2  <30 (AU/mL)   SJOGRENS SYNDROME-B EXTRACTABLE NUCLEAR ANTIBODY     Status: Normal   Collection Time   06/11/11 12:04 AM      Component Value Range Comment   SSB (La) (ENA) Antibody, IgG <1  <30 (AU/mL)   LACTATE DEHYDROGENASE     Status: Abnormal   Collection Time   06/11/11 12:04 AM      Component Value Range Comment   LD 362 (*) 94 - 250 (U/L)   BLOOD GAS, ARTERIAL     Status: Abnormal   Collection Time   06/11/11  5:55 AM      Component Value Range Comment   O2 Content 2.0      Delivery systems NASAL CANNULA      pH, Arterial 7.292 (*) 7.350 - 7.450     pCO2 arterial 57.2 (*) 35.0 - 45.0 (mmHg)    pO2, Arterial 75.4 (*) 80.0 - 100.0 (mmHg)    Bicarbonate 26.8 (*) 20.0 - 24.0 (mEq/L)    TCO2 28.5  0 - 100 (mmol/L)    Acid-Base Excess 0.9  0.0 - 2.0 (mmol/L)    O2 Saturation 93.9      Patient temperature 98.6        Collection site LEFT RADIAL      Drawn by [id number]      Sample type ARTERIAL DRAW      Allens test (pass/fail) PASS  PASS    COMPREHENSIVE METABOLIC PANEL     Status: Abnormal   Collection Time   06/11/11  6:40 AM      Component Value Range Comment   Sodium 136  135 - 145 (mEq/L)    Potassium 4.0  3.5 - 5.1 (mEq/L)    Chloride 100  96 - 112 (mEq/L)    CO2 27  19 - 32 (mEq/L)    Glucose, Bld 125 (*) 70 - 99 (mg/dL)    BUN 5 (*) 6 - 23 (mg/dL)    Creatinine, Ser 0.45  0.50 - 1.35 (mg/dL)    Calcium 8.8  8.4 - 10.5 (mg/dL)    Total Protein 6.9  6.0 - 8.3 (g/dL)    Albumin 3.2 (*) 3.5 - 5.2 (g/dL)    AST 84 (*) 0 - 37 (U/L)    ALT 36  0 - 53 (U/L)    Alkaline Phosphatase 82  39 - 117 (U/L)    Total Bilirubin 0.3  0.3 - 1.2 (mg/dL)    GFR calc non Af Amer >90  >90 (mL/min)    GFR calc Af Amer >90  >90 (mL/min)   CBC     Status: Abnormal   Collection Time   06/11/11  6:40 AM      Component Value Range Comment   WBC 5.6  4.0 - 10.5 (K/uL)    RBC 3.66 (*) 4.22 - 5.81 (MIL/uL)    Hemoglobin 12.4 (*) 13.0 - 17.0 (g/dL)    HCT 40.9 (*) 81.1 - 52.0 (%)    MCV 100.8 (*) 78.0 - 100.0 (fL)    MCH 33.9  26.0 - 34.0 (pg)    MCHC 33.6  30.0 - 36.0 (g/dL)    RDW 91.4  78.2 - 95.6 (%)    Platelets 165  150 - 400 (K/uL)   CARDIAC PANEL(CRET KIN+CKTOT+MB+TROPI)     Status: Abnormal   Collection Time   06/11/11  6:43 AM      Component Value  Range Comment   Total CK 5308 (*) 7 - 232 (U/L)    CK, MB 10.5 (*) 0.3 - 4.0 (ng/mL)    Troponin I <0.30  <0.30 (ng/mL)    Relative Index 0.2  0.0 - 2.5    GLUCOSE, CAPILLARY     Status: Abnormal   Collection Time   06/11/11  7:28 AM      Component Value Range Comment   Glucose-Capillary 103 (*) 70 - 99 (mg/dL)    Comment 1 Notify RN     CARDIAC PANEL(CRET KIN+CKTOT+MB+TROPI)     Status: Abnormal   Collection Time   06/11/11  9:08 AM      Component Value Range Comment   Total CK 5204 (*) 7 - 232 (U/L)    CK, MB 9.6 (*) 0.3 - 4.0 (ng/mL) CRITICAL  VALUE NOTED.  VALUE IS CONSISTENT WITH PREVIOUSLY REPORTED AND CALLED VALUE.   Troponin I <0.30  <0.30 (ng/mL)    Relative Index 0.2  0.0 - 2.5    PRO B NATRIURETIC PEPTIDE     Status: Normal   Collection Time   06/11/11  9:08 AM      Component Value Range Comment   Pro B Natriuretic peptide (BNP) 124.0  0 - 125 (pg/mL)   BLOOD GAS, ARTERIAL     Status: Abnormal   Collection Time   06/11/11 10:34 AM      Component Value Range Comment   FIO2 .50      Delivery systems BILEVEL POSITIVE AIRWAY PRESSURE      Mode BILEVEL POSITIVE AIRWAY PRESSURE      Rate 10      Inspiratory PAP 12      Expiratory PAP 6      pH, Arterial 7.484 (*) 7.350 - 7.450     pCO2 arterial 37.4  35.0 - 45.0 (mmHg)    pO2, Arterial 209.0 (*) 80.0 - 100.0 (mmHg)    Bicarbonate 27.8 (*) 20.0 - 24.0 (mEq/L)    TCO2 29.0  0 - 100 (mmol/L)    Acid-Base Excess 4.4 (*) 0.0 - 2.0 (mmol/L)    O2 Saturation 99.9      Patient temperature 98.6      Collection site RIGHT RADIAL      Drawn by [phone]      Sample type ARTERIAL      Allens test (pass/fail) PASS  PASS    CARDIAC PANEL(CRET KIN+CKTOT+MB+TROPI)     Status: Abnormal   Collection Time   06/11/11  3:49 PM      Component Value Range Comment   Total CK 3596 (*) 7 - 232 (U/L)    CK, MB 5.5 (*) 0.3 - 4.0 (ng/mL)    Troponin I <0.30  <0.30 (ng/mL)    Relative Index 0.2  0.0 - 2.5    CARDIAC PANEL(CRET KIN+CKTOT+MB+TROPI)     Status: Abnormal   Collection Time   06/12/11 12:11 AM      Component Value Range Comment   Total CK 2612 (*) 7 - 232 (U/L)    CK, MB 4.7 (*) 0.3 - 4.0 (ng/mL)    Troponin I <0.30  <0.30 (ng/mL)    Relative Index 0.2  0.0 - 2.5    CBC     Status: Abnormal   Collection Time   06/12/11  6:00 AM      Component Value Range Comment   WBC 4.3  4.0 - 10.5 (K/uL)    RBC 3.90 (*) 4.22 - 5.81 (MIL/uL)  Hemoglobin 13.1  13.0 - 17.0 (g/dL)    HCT 96.0  45.4 - 09.8 (%)    MCV 100.3 (*) 78.0 - 100.0 (fL)    MCH 33.6  26.0 - 34.0 (pg)    MCHC 33.5   30.0 - 36.0 (g/dL)    RDW 11.9  14.7 - 82.9 (%)    Platelets 157  150 - 400 (K/uL)   COMPREHENSIVE METABOLIC PANEL     Status: Abnormal   Collection Time   06/12/11  6:00 AM      Component Value Range Comment   Sodium 138  135 - 145 (mEq/L)    Potassium 3.9  3.5 - 5.1 (mEq/L)    Chloride 99  96 - 112 (mEq/L)    CO2 30  19 - 32 (mEq/L)    Glucose, Bld 89  70 - 99 (mg/dL)    BUN 8  6 - 23 (mg/dL)    Creatinine, Ser 5.62  0.50 - 1.35 (mg/dL)    Calcium 8.8  8.4 - 10.5 (mg/dL)    Total Protein 6.8  6.0 - 8.3 (g/dL)    Albumin 2.9 (*) 3.5 - 5.2 (g/dL)    AST 66 (*) 0 - 37 (U/L)    ALT 42  0 - 53 (U/L)    Alkaline Phosphatase 97  39 - 117 (U/L)    Total Bilirubin 0.3  0.3 - 1.2 (mg/dL)    GFR calc non Af Amer >90  >90 (mL/min)    GFR calc Af Amer >90  >90 (mL/min)     Micro Results: No results found for this or any previous visit (from the past 240 hour(s)).  Studies/Results: Dg Chest 2 View  06/10/2011  *RADIOLOGY REPORT*  Clinical Data: Shortness of breath.  Chest pain  CHEST - 2 VIEW  Comparison: 04/02/2011  Findings: Hyperexpansion is consistent with emphysema. The lungs are clear without focal infiltrate, edema, pneumothorax or pleural effusion.  Linear scarring is seen at the left lung base. The cardiopericardial silhouette is within normal limits for size.  IMPRESSION: Stable.  No acute findings.  Original Report Authenticated By: ERIC A. MANSELL, M.D.   Medications: Scheduled Meds:    . aspirin  325 mg Oral Daily  . clonazePAM  2 mg Oral BID  . clopidogrel  75 mg Oral Daily  . divalproex  500 mg Oral TID PC & HS  . docusate sodium  100 mg Oral BID  . furosemide  60 mg Intravenous Q12H  . gabapentin  600 mg Oral TID  . heparin  5,000 Units Subcutaneous Q8H  . influenza  inactive virus vaccine  0.5 mL Intramuscular Tomorrow-1000  . metoprolol tartrate  25 mg Oral BID  . naloxone      . nicotine  7 mg Transdermal Daily  . olanzapine-FLUoxetine  1 capsule Oral QHS  .  senna  1 tablet Oral BID   Continuous Infusions:  PRN Meds:.acetaminophen, acetaminophen, naloxone (NARCAN) injection, ondansetron (ZOFRAN) IV, ondansetron  Assessment/Plan: #1 Acute Respiratory failure-most likely secondary to pulmonary congestion.Resolved #2 Congestive Heart Fialure-will adjust the dose of lasix and lisinopril #3 hx of CAD #4 Persistent Headache-resolved #5 Hx of Macrocytic Anemia #6 Gerd-will give protonix #7 Hx of Depression and Anxiety Disorder     LOS: 2 days   Leeroy Lovings 06/12/2011, 3:40 PM

## 2011-06-13 LAB — COMPREHENSIVE METABOLIC PANEL
AST: 37 U/L (ref 0–37)
Albumin: 3 g/dL — ABNORMAL LOW (ref 3.5–5.2)
BUN: 10 mg/dL (ref 6–23)
Calcium: 9.2 mg/dL (ref 8.4–10.5)
Creatinine, Ser: 0.91 mg/dL (ref 0.50–1.35)
Total Bilirubin: 0.3 mg/dL (ref 0.3–1.2)
Total Protein: 7 g/dL (ref 6.0–8.3)

## 2011-06-13 LAB — CBC
HCT: 35.2 % — ABNORMAL LOW (ref 39.0–52.0)
MCH: 34.5 pg — ABNORMAL HIGH (ref 26.0–34.0)
MCHC: 34.7 g/dL (ref 30.0–36.0)
MCV: 99.4 fL (ref 78.0–100.0)
Platelets: 176 10*3/uL (ref 150–400)
RDW: 13.5 % (ref 11.5–15.5)
WBC: 6.7 10*3/uL (ref 4.0–10.5)

## 2011-06-13 MED ORDER — LISINOPRIL 2.5 MG PO TABS: 5.0000 mg | ORAL_TABLET | Freq: Every day | ORAL | Status: DC

## 2011-06-13 MED ORDER — LISINOPRIL 2.5 MG PO TABS
2.5000 mg | ORAL_TABLET | Freq: Every day | ORAL | Status: DC
  Administered 2011-06-13: 2.5 mg via ORAL
  Filled 2011-06-13: qty 1

## 2011-06-13 MED ORDER — FUROSEMIDE 40 MG PO TABS: 40.0000 mg | ORAL_TABLET | Freq: Every day | ORAL | Status: DC

## 2011-06-13 MED ORDER — NALOXONE HCL 0.4 MG/ML IJ SOLN
0.4000 mg | Freq: Once | INTRAMUSCULAR | Status: AC
  Administered 2011-06-13: 0.4 mg via INTRAVENOUS

## 2011-06-13 NOTE — ED Provider Notes (Signed)
Medical screening examination/treatment/procedure(s) were performed by non-physician practitioner and as supervising physician I was immediately available for consultation/collaboration.  Flint Melter, MD 06/13/11 [phone]

## 2011-06-13 NOTE — Progress Notes (Signed)
Pt c/o pain in head and back 10/10, states that he needed something more for pain, other than tylenol. Called md on call made her aware of pt request and need to speak to md if nothing was ordered. Md aware of episode prior day with iv dilaudid. New orders given. Will continue to monitor.

## 2011-06-13 NOTE — Discharge Summary (Signed)
DISCHARGE SUMMARY  [name]  MR#: [id number]  DOB:07/09/1960  Date of Admission: 06/10/2011 Date of Discharge: 06/13/2011  Attending Physician:Marget Outten  Patient's OZH:[id number] Yetta Barre, MD, MD  Consults:Treatment Team:  Carlota Raspberry, MD  Discharge Diagnoses: #1 flash pulmonary edema/congestive heart failure questionable systole dysfunction. At discharge 2-D echo pending #2 questionable rhabdomyolysis #3 history of coronary artery disease #4 history of schizophrenia #5 bipolar affective disorder #6 depression #7 hypertension #8 macrocytic anemia #9 GERD #10 hyperlipidemia  Present on Admission:  .Rhabdomyolysis .Edema of both legs .Coronary artery disease .Depression with anxiety .Abnormal EKG .Abnormal LFTs .Anemia, macrocytic    Current Discharge Medication List    START taking these medications   Details  furosemide (LASIX) 40 MG tablet Take 1 tablet (40 mg total) by mouth daily. Qty: 30 tablet, Refills: 1    lisinopril (PRINIVIL,ZESTRIL) 2.5 MG tablet Take 2 tablets (5 mg total) by mouth daily. Qty: 30 tablet, Refills: 1      CONTINUE these medications which have NOT CHANGED   Details  aspirin 325 MG tablet Take 325 mg by mouth daily.     clonazePAM (KLONOPIN) 2 MG tablet Take 2 mg by mouth 2 (two) times daily. Takes 2 tabs every day per patient     clopidogrel (PLAVIX) 75 MG tablet Take 1 tablet (75 mg total) by mouth daily. Qty: 90 tablet, Refills: 3   Associated Diagnoses: Old myocardial infarction; Pure hypercholesterolemia    divalproex (DEPAKOTE) 500 MG 24 hr tablet Take 500 mg by mouth 4 (four) times daily - after meals and at bedtime. Patient takes 4 times a day per patient.    gabapentin (NEURONTIN) 600 MG tablet Take 600 mg by mouth 3 (three) times daily.      metoprolol tartrate (LOPRESSOR) 25 MG tablet Take 25 mg by mouth 2 (two) times daily.      Multiple Vitamin (MULTIVITAMIN) tablet Take 1 tablet by mouth daily.        nitroGLYCERIN (NITROSTAT) 0.4 MG SL tablet Place 0.4 mg under the tongue every 5 (five) minutes as needed. 1 tablet under tongue at onset of chest pain, may repeat every up to 3 doses     olanzapine-FLUoxetine (SYMBYAX) 12-50 MG per capsule Take 1 capsule by mouth at bedtime.      traMADol-acetaminophen (ULTRACET) 37.5-325 MG per tablet Take 1 tablet by mouth every 6 (six) hours as needed. For pain     hyoscyamine (LEVBID) 0.375 MG 12 hr tablet Take 1 tablet (0.375 mg total) by mouth every 12 (twelve) hours as needed for cramping. Qty: 25 tablet, Refills: 0    Pancrelipase, Lip-Prot-Amyl, 24000 UNITS CPEP Take 1 capsule (24,000 Units total) by mouth 3 (three) times daily with meals. Qty: 96 capsule, Refills: 0   Associated Diagnoses: Diarrhea          Hospital Course: Patient is a 50 year old Caucasian male with prior history of MI, coronary artery disease and  mood disorder was admitted to the hospital by Dr. Roselie Awkward on 06/10/2011 with complains of lower extremity swelling of 3 weeks duration and this was said to be getting progressively worse. There was no history of chest pain or shortness of the breath. NO history of PND or orthopnea. No history of fever, chills, or rigors. At the time patient was seen by the admitting physician he was found to hospital number lower extremity and cardiac enzymes showed elevated CPK level with normal troponin. Patient was admitted to telemetry 4 with an impression of rhabdomyolysis  started on IV hydration with normal saline. Patient in addition was given IV dilaudid. On 06/11/2011 I was called [place] rapid response the patient was in respiratory distress, altered mentation, and difficulty in breathing. At the time patient was seen by me, he was delerious, unable to complete a sentence and examination showed rales all over the lung fields with pedal edema.Impression at this time was acute pulmonary edema with congestive heart failure and patient was started  on aggressive diuresis and O2 by nonrebreather mask. Patient also was given IV narcan. Patient was then to be transferred to step down. Chest is for orders showed pulmonary congestion on proBNP was marginally elevated, cardiac enzym, was essentially unremarkable i.e. Troponin I. IV normal saline that was initiated on admission was discontinued. Patient's mentation improved, and transferred to step down unit was discontinued. Patient was continued on diurese, lisinopril was added to regimen, as well as Lopressor. Also given to patient was aspirin, as well as Plavix. Patient was also restarted on some of his mood stabilizing drugs which include klonopin as well as the Depakote. Patient's clinical condition improved.. Oxygen was discontinued and the pedal edema showing remarkable resolution. Over the night on 06/12/2011, patient was said to have been given narcotic analgesic and he became slightly delirious and this was reversed with IV Narcan. Patient was seen by me this evening, 06/13/2011, awake alert and oriented after IV Narcan. He denied any complaint. No chest pain, no shortness of breath and complete resolution of pedal edema. At the time of this discharge summary 2-D echo was depending. Patient demanded to be discharged home today. Examination of patient with essential unremarkable. Vital signs stable. Plan is fo for patient to be discharge home today.    Day of Discharge BP 107/67  Pulse 99  Temp(Src) 99.5 F (37.5 C) (Oral)  Resp 18  Ht 5\' 7"  (1.702 m)  Wt 74.662 kg (164 lb 9.6 oz)  BMI 25.78 kg/m2  SpO2 91%  Physical Exam:Vitals as above heent-no pallor Neck-no jvd Chest-clear cvs-s1 and s2 Abd-soft,non tender,no organs palpable and bowel sounds are present. Ext-no pedal edema Neuro-non focal Skin-no ecchymoses  Results for orders placed during the hospital encounter of 06/10/11 (from the past 24 hour(s))  CBC     Status: Abnormal   Collection Time   06/13/11  7:25 AM       Component Value Range   WBC 6.7  4.0 - 10.5 (K/uL)   RBC 3.54 (*) 4.22 - 5.81 (MIL/uL)   Hemoglobin 12.2 (*) 13.0 - 17.0 (g/dL)   HCT 40.9 (*) 81.1 - 52.0 (%)   MCV 99.4  78.0 - 100.0 (fL)   MCH 34.5 (*) 26.0 - 34.0 (pg)   MCHC 34.7  30.0 - 36.0 (g/dL)   RDW 91.4  78.2 - 95.6 (%)   Platelets 176  150 - 400 (K/uL)  COMPREHENSIVE METABOLIC PANEL     Status: Abnormal   Collection Time   06/13/11  7:25 AM      Component Value Range   Sodium 135  135 - 145 (mEq/L)   Potassium 3.6  3.5 - 5.1 (mEq/L)   Chloride 97  96 - 112 (mEq/L)   CO2 27  19 - 32 (mEq/L)   Glucose, Bld 90  70 - 99 (mg/dL)   BUN 10  6 - 23 (mg/dL)   Creatinine, Ser 2.13  0.50 - 1.35 (mg/dL)   Calcium 9.2  8.4 - 08.6 (mg/dL)   Total Protein 7.0  6.0 - 8.3 (  g/dL)   Albumin 3.0 (*) 3.5 - 5.2 (g/dL)   AST 37  0 - 37 (U/L)   ALT 32  0 - 53 (U/L)   Alkaline Phosphatase 94  39 - 117 (U/L)   Total Bilirubin 0.3  0.3 - 1.2 (mg/dL)   GFR calc non Af Amer >90  >90 (mL/min)   GFR calc Af Amer >90  >90 (mL/min)    Disposition: stable   Follow-up Appts: Discharge Orders    Future Appointments: Provider: Department: Dept Phone: Center:   06/24/2011 10:15 AM [name], MD [facility] [phone] [facility]     Future Orders Please Complete By Expires   Diet - low sodium heart healthy      Increase activity slowly      Discharge instructions      Comments:   Follow up with pcp in 1-2weeks      Signed: Laurna Shetley 06/13/2011, 3:45 PM

## 2011-06-13 NOTE — Progress Notes (Signed)
Pt has been  getting increasingly lethargic since this morning. Given oxy IR at 2000 last night and experienced some confusion. Pt VS are BP 97/66, P 83, T 98.1 and R 16. Pt on RA was 92%. Put on 1 L N/C and went up to 95%. Called MD and narcan ordered. Pt given narcan with positive response.  Pt is more alert. MD notified. Peter Congo RN

## 2011-06-13 NOTE — Progress Notes (Signed)
Pt confused. Alert orientedx1. Reoriented pt to room and situation. Bed alarm placed on pt, monitoring will continue.

## 2011-06-14 NOTE — Progress Notes (Signed)
PT WAS DC'D TO HOME WITH SELF CARE.   [name] 06/14/2011 [phone] OR [phone]

## 2011-06-15 LAB — BENZODIAZEPINE, QUANTITATIVE, URINE
Alprazolam (GC/LC/MS), ur confirm: NEGATIVE NG/ML
Nordiazepam GC/MS Conf: NEGATIVE NG/ML
Oxazepam GC/MS Conf: NEGATIVE NG/ML

## 2011-06-16 ENCOUNTER — Emergency Department (HOSPITAL_COMMUNITY): Payer: 59

## 2011-06-16 ENCOUNTER — Inpatient Hospital Stay (HOSPITAL_COMMUNITY)
Admission: AD | Admit: 2011-06-16 | Discharge: 2011-06-18 | DRG: 392 | Disposition: A | Discharge status: Home / Self Care | Payer: 59 | Attending: Internal Medicine | Admitting: Internal Medicine

## 2011-06-16 ENCOUNTER — Encounter (HOSPITAL_COMMUNITY): Payer: Self-pay | Admitting: Emergency Medicine

## 2011-06-16 DIAGNOSIS — R339 Retention of urine, unspecified: Secondary | ICD-10-CM | POA: Diagnosis present

## 2011-06-16 DIAGNOSIS — F17200 Nicotine dependence, unspecified, uncomplicated: Secondary | ICD-10-CM | POA: Diagnosis present

## 2011-06-16 DIAGNOSIS — R4182 Altered mental status, unspecified: Secondary | ICD-10-CM | POA: Diagnosis present

## 2011-06-16 DIAGNOSIS — I252 Old myocardial infarction: Secondary | ICD-10-CM

## 2011-06-16 DIAGNOSIS — Z7982 Long term (current) use of aspirin: Secondary | ICD-10-CM

## 2011-06-16 DIAGNOSIS — R109 Unspecified abdominal pain: Secondary | ICD-10-CM | POA: Diagnosis present

## 2011-06-16 DIAGNOSIS — K5900 Constipation, unspecified: Principal | ICD-10-CM | POA: Diagnosis present

## 2011-06-16 DIAGNOSIS — I129 Hypertensive chronic kidney disease with stage 1 through stage 4 chronic kidney disease, or unspecified chronic kidney disease: Secondary | ICD-10-CM | POA: Diagnosis present

## 2011-06-16 DIAGNOSIS — E785 Hyperlipidemia, unspecified: Secondary | ICD-10-CM

## 2011-06-16 DIAGNOSIS — L8992 Pressure ulcer of unspecified site, stage 2: Secondary | ICD-10-CM | POA: Diagnosis present

## 2011-06-16 DIAGNOSIS — L89609 Pressure ulcer of unspecified heel, unspecified stage: Secondary | ICD-10-CM | POA: Diagnosis present

## 2011-06-16 DIAGNOSIS — N189 Chronic kidney disease, unspecified: Secondary | ICD-10-CM | POA: Diagnosis present

## 2011-06-16 DIAGNOSIS — K219 Gastro-esophageal reflux disease without esophagitis: Secondary | ICD-10-CM | POA: Diagnosis present

## 2011-06-16 DIAGNOSIS — E7800 Pure hypercholesterolemia, unspecified: Secondary | ICD-10-CM

## 2011-06-16 DIAGNOSIS — R197 Diarrhea, unspecified: Secondary | ICD-10-CM

## 2011-06-16 DIAGNOSIS — I2510 Atherosclerotic heart disease of native coronary artery without angina pectoris: Secondary | ICD-10-CM | POA: Diagnosis present

## 2011-06-16 DIAGNOSIS — R0602 Shortness of breath: Secondary | ICD-10-CM | POA: Diagnosis present

## 2011-06-16 DIAGNOSIS — N390 Urinary tract infection, site not specified: Secondary | ICD-10-CM | POA: Diagnosis present

## 2011-06-16 DIAGNOSIS — F319 Bipolar disorder, unspecified: Secondary | ICD-10-CM | POA: Diagnosis present

## 2011-06-16 DIAGNOSIS — I509 Heart failure, unspecified: Secondary | ICD-10-CM | POA: Diagnosis present

## 2011-06-16 LAB — LIPASE, BLOOD: Lipase: 26 U/L (ref 11–59)

## 2011-06-16 LAB — COMPREHENSIVE METABOLIC PANEL
ALT: 17 U/L (ref 0–53)
Alkaline Phosphatase: 82 U/L (ref 39–117)
BUN: 18 mg/dL (ref 6–23)
CO2: 29 mEq/L (ref 19–32)
Calcium: 8.9 mg/dL (ref 8.4–10.5)
GFR calc Af Amer: 84 mL/min — ABNORMAL LOW (ref >90)
GFR calc non Af Amer: 72 mL/min — ABNORMAL LOW (ref >90)
Glucose, Bld: 97 mg/dL (ref 70–99)
Sodium: 134 mEq/L — ABNORMAL LOW (ref 135–145)

## 2011-06-16 LAB — CBC
HCT: 33.6 % — ABNORMAL LOW (ref 39.0–52.0)
Hemoglobin: 11.6 g/dL — ABNORMAL LOW (ref 13.0–17.0)
MCH: 34.6 pg — ABNORMAL HIGH (ref 26.0–34.0)
MCV: 100.3 fL — ABNORMAL HIGH (ref 78.0–100.0)
Platelets: 233 10*3/uL (ref 150–400)
RBC: 3.35 MIL/uL — ABNORMAL LOW (ref 4.22–5.81)
WBC: 7.5 10*3/uL (ref 4.0–10.5)

## 2011-06-16 LAB — DIFFERENTIAL
Eosinophils Absolute: 0.3 10*3/uL (ref 0.0–0.7)
Eosinophils Relative: 4 % (ref 0–5)
Lymphocytes Relative: 28 % (ref 12–46)
Lymphs Abs: 2.1 10*3/uL (ref 0.7–4.0)
Monocytes Relative: 7 % (ref 3–12)

## 2011-06-16 LAB — CARDIAC PANEL(CRET KIN+CKTOT+MB+TROPI): CK, MB: 5 ng/mL — ABNORMAL HIGH (ref 0.3–4.0)

## 2011-06-16 MED ORDER — SODIUM CHLORIDE 0.9 % IV BOLUS (SEPSIS)
500.0000 mL | Freq: Once | INTRAVENOUS | Status: AC
  Administered 2011-06-16: 500 mL via INTRAVENOUS

## 2011-06-16 NOTE — ED Notes (Signed)
PT c/o left lower back pain and not urinating since 0230; pt recently discharged from hospital and is on lasix; pt c/o cramping in legs; pt sts does not feel like he has to go

## 2011-06-16 NOTE — ED Notes (Signed)
Pt. To CT scan.

## 2011-06-16 NOTE — ED Notes (Signed)
Pt with new blisters around mouth and to both feet
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
## 2011-06-16 NOTE — ED Notes (Signed)
Received pt. From triage  Via w/c, pt. Transferred to stretcher with assist. Generalized weakness noted, pt. Alert and oriented, family at the bedside
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
## 2011-06-17 ENCOUNTER — Encounter (HOSPITAL_COMMUNITY): Payer: Self-pay | Admitting: Radiology

## 2011-06-17 LAB — URINALYSIS, ROUTINE W REFLEX MICROSCOPIC
Glucose, UA: NEGATIVE mg/dL
Hgb urine dipstick: NEGATIVE
Leukocytes, UA: NEGATIVE
pH: 7 (ref 5.0–8.0)

## 2011-06-17 LAB — CARDIAC PANEL(CRET KIN+CKTOT+MB+TROPI)
CK, MB: 4.4 ng/mL — ABNORMAL HIGH (ref 0.3–4.0)
CK, MB: 3.4 ng/mL (ref 0.3–4.0)
Relative Index: 1.2 (ref 0.0–2.5)
Relative Index: 1.2 (ref 0.0–2.5)
Total CK: 365 U/L — ABNORMAL HIGH (ref 7–232)
Total CK: 279 U/L — ABNORMAL HIGH (ref 7–232)

## 2011-06-17 LAB — IRON AND TIBC
Iron: 56 ug/dL (ref 42–135)
Saturation Ratios: 17 % — ABNORMAL LOW (ref 20–55)
TIBC: 339 ug/dL (ref 215–435)
UIBC: 283 ug/dL (ref 125–400)

## 2011-06-17 LAB — COMPREHENSIVE METABOLIC PANEL
ALT: 16 U/L (ref 0–53)
CO2: 28 mEq/L (ref 19–32)
Calcium: 8.9 mg/dL (ref 8.4–10.5)
Creatinine, Ser: 0.95 mg/dL (ref 0.50–1.35)
GFR calc Af Amer: >90 mL/min (ref >90)
GFR calc non Af Amer: >90 mL/min (ref >90)
Glucose, Bld: 77 mg/dL (ref 70–99)
Sodium: 133 mEq/L — ABNORMAL LOW (ref 135–145)
Total Protein: 6.9 g/dL (ref 6.0–8.3)

## 2011-06-17 LAB — FOLATE: Folate: >20 ng/mL

## 2011-06-17 LAB — CBC
HCT: 34.7 % — ABNORMAL LOW (ref 39.0–52.0)
Hemoglobin: 12.1 g/dL — ABNORMAL LOW (ref 13.0–17.0)
MCHC: 34.9 g/dL (ref 30.0–36.0)
RBC: 3.48 MIL/uL — ABNORMAL LOW (ref 4.22–5.81)

## 2011-06-17 LAB — VITAMIN B12: Vitamin B-12: 289 pg/mL (ref 211–911)

## 2011-06-17 MED ORDER — ONDANSETRON HCL 4 MG/2ML IJ SOLN: 4.0000 mg | Freq: Four times a day (QID) | INTRAMUSCULAR | Status: DC | PRN

## 2011-06-17 MED ORDER — PANCRELIPASE (LIP-PROT-AMYL) 12000-38000 UNITS PO CPEP
2.0000 | ORAL_CAPSULE | Freq: Three times a day (TID) | ORAL | Status: DC
  Administered 2011-06-17 – 2011-06-18 (×5): 2 via ORAL
  Filled 2011-06-17 (×7): qty 2

## 2011-06-17 MED ORDER — CLONAZEPAM 1 MG PO TABS
2.0000 mg | ORAL_TABLET | Freq: Two times a day (BID) | ORAL | Status: DC
  Administered 2011-06-17 – 2011-06-18 (×4): 2 mg via ORAL
  Filled 2011-06-17 (×4): qty 2

## 2011-06-17 MED ORDER — ACETAMINOPHEN 325 MG PO TABS
650.0000 mg | ORAL_TABLET | Freq: Four times a day (QID) | ORAL | Status: DC | PRN
  Administered 2011-06-17 – 2011-06-18 (×2): 650 mg via ORAL
  Filled 2011-06-17 (×2): qty 2

## 2011-06-17 MED ORDER — TRAMADOL-ACETAMINOPHEN 37.5-325 MG PO TABS
1.0000 | ORAL_TABLET | Freq: Four times a day (QID) | ORAL | Status: DC | PRN
  Administered 2011-06-17: 1 via ORAL
  Filled 2011-06-17 (×2): qty 1

## 2011-06-17 MED ORDER — OLANZAPINE-FLUOXETINE HCL 12-50 MG PO CAPS
1.0000 | ORAL_CAPSULE | Freq: Every day | ORAL | Status: DC
  Administered 2011-06-17: 1 via ORAL
  Filled 2011-06-17 (×2): qty 1

## 2011-06-17 MED ORDER — ASPIRIN 325 MG PO TABS
325.0000 mg | ORAL_TABLET | Freq: Every day | ORAL | Status: DC
  Administered 2011-06-17 – 2011-06-18 (×2): 325 mg via ORAL
  Filled 2011-06-17 (×2): qty 1

## 2011-06-17 MED ORDER — GABAPENTIN 600 MG PO TABS
600.0000 mg | ORAL_TABLET | Freq: Three times a day (TID) | ORAL | Status: DC
  Administered 2011-06-17 – 2011-06-18 (×5): 600 mg via ORAL
  Filled 2011-06-17 (×8): qty 1

## 2011-06-17 MED ORDER — NITROGLYCERIN 0.4 MG SL SUBL: 0.4000 mg | SUBLINGUAL_TABLET | SUBLINGUAL | Status: DC | PRN

## 2011-06-17 MED ORDER — DEXTROSE 5 % IV SOLN
1.0000 g | INTRAVENOUS | Status: DC
  Administered 2011-06-17: 1 g via INTRAVENOUS
  Filled 2011-06-17 (×3): qty 10

## 2011-06-17 MED ORDER — ACETAMINOPHEN 650 MG RE SUPP: 650.0000 mg | Freq: Four times a day (QID) | RECTAL | Status: DC | PRN

## 2011-06-17 MED ORDER — ONDANSETRON HCL 4 MG PO TABS: 4.0000 mg | ORAL_TABLET | Freq: Four times a day (QID) | ORAL | Status: DC | PRN

## 2011-06-17 MED ORDER — DIVALPROEX SODIUM ER 500 MG PO TB24
1500.0000 mg | ORAL_TABLET | Freq: Every day | ORAL | Status: DC
  Administered 2011-06-17 (×2): 1500 mg via ORAL
  Filled 2011-06-17 (×2): qty 3

## 2011-06-17 MED ORDER — CLOPIDOGREL BISULFATE 75 MG PO TABS
75.0000 mg | ORAL_TABLET | Freq: Every day | ORAL | Status: DC
  Administered 2011-06-17 – 2011-06-18 (×2): 75 mg via ORAL
  Filled 2011-06-17 (×3): qty 1

## 2011-06-17 MED ORDER — FUROSEMIDE 40 MG PO TABS
40.0000 mg | ORAL_TABLET | Freq: Every day | ORAL | Status: DC
  Filled 2011-06-17: qty 1

## 2011-06-17 MED ORDER — HYOSCYAMINE SULFATE ER 0.375 MG PO TB12
0.3750 mg | ORAL_TABLET | Freq: Two times a day (BID) | ORAL | Status: DC | PRN
  Filled 2011-06-17: qty 1

## 2011-06-17 MED ORDER — POLYETHYLENE GLYCOL 3350 17 G PO PACK
17.0000 g | PACK | Freq: Every day | ORAL | Status: DC
  Filled 2011-06-17 (×2): qty 1

## 2011-06-17 MED ORDER — SODIUM CHLORIDE 0.9 % IJ SOLN
3.0000 mL | Freq: Two times a day (BID) | INTRAMUSCULAR | Status: DC
  Administered 2011-06-17 – 2011-06-18 (×4): 3 mL via INTRAVENOUS

## 2011-06-17 MED ORDER — DIVALPROEX SODIUM ER 500 MG PO TB24
1500.0000 mg | ORAL_TABLET | ORAL | Status: AC
  Administered 2011-06-17: 1500 mg via ORAL
  Filled 2011-06-17: qty 3

## 2011-06-17 MED ORDER — OLANZAPINE-FLUOXETINE HCL 12-50 MG PO CAPS
1.0000 | ORAL_CAPSULE | ORAL | Status: AC
  Administered 2011-06-17: 2 via ORAL
  Filled 2011-06-17: qty 1

## 2011-06-17 MED ORDER — METOPROLOL TARTRATE 25 MG PO TABS
25.0000 mg | ORAL_TABLET | Freq: Two times a day (BID) | ORAL | Status: DC
  Filled 2011-06-17 (×2): qty 1

## 2011-06-17 MED ORDER — LISINOPRIL 5 MG PO TABS
5.0000 mg | ORAL_TABLET | Freq: Every day | ORAL | Status: DC
  Filled 2011-06-17: qty 1

## 2011-06-17 MED ORDER — FENTANYL CITRATE 0.05 MG/ML IJ SOLN
50.0000 ug | Freq: Once | INTRAMUSCULAR | Status: AC
  Administered 2011-06-17: 50 ug via INTRAVENOUS
  Filled 2011-06-17: qty 2

## 2011-06-17 NOTE — Progress Notes (Signed)
Subjective: Awake, oriented. He relates he had BM today. He was able to urinate.  Objective: Filed Vitals:   06/17/11 0100 06/17/11 0115 06/17/11 0126 06/17/11 0127  BP: 99/65 99/66 97/65  99/65  Pulse: 72 73 75   Temp:   97.9 F (36.6 C)   TempSrc:   Oral   Resp:   20   Height:  5\' 6"  (1.676 m)    Weight:  68.9 kg (151 lb 14.4 oz)    SpO2: 95% 95% 97%    Weight change:   Intake/Output Summary (Last 24 hours) at 06/17/11 1105 Last data filed at 06/17/11 0200  Gross per 24 hour  Intake    240 ml  Output      0 ml  Net    240 ml    General: Alert, awake, oriented x3, in no acute distress.  HEENT: No bruits, no goiter.  Heart: Regular rate and rhythm, without murmurs, rubs, gallops.  Lungs: CTA, bilateral air movement.  Abdomen: Soft, nontender, nondistended, positive bowel sounds.  Neuro: Grossly intact, nonfocal. Extremity, blister, right malleoli, left heel pressure ulcer.   Lab Results:  [facility] 06/17/11 0236 06/16/11 1954  NA 133* 134*  K 3.8 3.6  CL 96 96  CO2 28 29  GLUCOSE 77 97  BUN 13 18  CREATININE 0.95 1.14  CALCIUM 8.9 8.9  MG -- --  PHOS -- --    Basename 06/17/11 0236 06/16/11 1954  AST 22 22  ALT 16 17  ALKPHOS 83 82  BILITOT 0.2* 0.2*  PROT 6.9 7.0  ALBUMIN 3.0* 3.1*    Basename 06/16/11 1954  LIPASE 26  AMYLASE --    Basename 06/17/11 0236 06/16/11 1954  WBC 6.5 7.5  NEUTROABS -- 4.5  HGB 12.1* 11.6*  HCT 34.7* 33.6*  MCV 99.7 100.3*  PLT 236 233    Basename 06/17/11 0236 06/16/11 1956  CKTOTAL 365* 409*  CKMB 4.4* 5.0*  CKMBINDEX -- --  TROPONINI <0.30 <0.30   Basename 06/17/11 0236  VITAMINB12 --  FOLATE --  FERRITIN --  TIBC --  IRON --  RETICCTPCT 1.2    Studies/Results: Ct Abdomen Pelvis Wo Contrast  06/17/2011  *RADIOLOGY REPORT*  Clinical Data: Abdominal pain, evaluate for hydronephrosis  CT ABDOMEN AND PELVIS WITHOUT CONTRAST  Technique:  Multidetector CT imaging of the abdomen and pelvis was performed  following the standard protocol without intravenous contrast.  Comparison: None.  Findings: Mild patchy opacities / atelectasis at the left lung base.  Unenhanced liver, spleen, pancreas, and adrenal glands within normal limits.  Gallbladder is unremarkable.  No intrahepatic or extrahepatic ductal dilatation.  Kidneys are within normal limits.  No renal calculi or hydronephrosis.  No evidence of bowel obstruction.  Normal appendix.  Moderate stool throughout the colon.  Atherosclerotic calcifications of the abdominal aorta and branch vessels.  No abdominopelvic ascites.  No suspicious abdominopelvic lymphadenopathy.  Prostate is unremarkable.  No ureteral or bladder calculi.  Bladder is mildly thick-walled but underdistended.  Degenerative changes of the visualized thoracolumbar spine.  Mild superior endplate compression deformity at to L2 and inferior endplate compression deformity at L4.  IMPRESSION: No renal, ureteral, or bladder calculi.  No hydronephrosis.  Normal appendix.  No evidence of bowel obstruction.  Moderate stool throughout the colon.  Bladder is mildly thick-walled but underdistended.  Original Report Authenticated By: Charline Bills, M.D.   Ct Head Wo Contrast  06/17/2011  *RADIOLOGY REPORT*  Clinical Data: Confusion, headache  CT HEAD WITHOUT CONTRAST  Technique:  Contiguous axial images were obtained from the base of the skull through the vertex without contrast.  Comparison: 06/11/2011  Findings: No evidence of parenchymal hemorrhage or extra-axial fluid collection. No mass lesion, mass effect, or midline shift.  No CT evidence of acute infarction.  Mild global cortical atrophy out of portion for age.  The visualized paranasal sinuses are essentially clear. The mastoid air cells are unopacified.  No evidence of calvarial fracture.  IMPRESSION: No evidence of acute intracranial abnormality.  Mild global cortical atrophy.  Original Report Authenticated By: Charline Bills, M.D.   Dg Abd  Acute W/chest  06/16/2011  *RADIOLOGY REPORT*  Clinical Data: Lower back pain  ACUTE ABDOMEN SERIES (ABDOMEN 2 VIEW & CHEST 1 VIEW)  Comparison: 06/11/2011  Findings: Cardiomediastinal silhouette is stable. No acute infiltrate or pleural effusion.  No pulmonary edema.  There is nonspecific nonobstructive bowel gas pattern.  Significant stool noted in the right colon and transverse colon.  Mild degenerative changes lumbar spine.  No free abdominal air.  IMPRESSION: No acute disease.  Significant colonic stool proximal colon.  No free abdominal air.  Nonspecific nonobstructive bowel gas pattern.  Original Report Authenticated By: Natasha Mead, M.D.    Medications: I have reviewed the patient's current medications.   Patient Active Hospital Problem List:  Abdominal pain (06/16/2011) Probably secondary to constipation. Maybe component UTI, Thickening bladder on Ct. He has mild suprapubic tenderness on palpation. I will start empirically Ceftriaxone. I will get urine culture. I will start miralax.   AMS: Per sister patient more sleepy at home. I will observe on current medications. His Gabapentin dose was recently decrease. I will stop tramadol.  Hypotension: resolved. Holding lisinopril, metoprolol, lasix.   History Pulmonary edema: I will hold lasix. I was not able to find ECHO result in the Computer. I will ask the nurse to ask ECHO department for result.    LOS: 1 day   Mario Voong M.D.  [facility] 06/17/2011, 11:05 AM

## 2011-06-17 NOTE — Progress Notes (Signed)
   CARE MANAGEMENT NOTE 06/17/2011  Patient:  [name], [name]   Account Number:  [id number]  Date Initiated:  06/17/2011  Documentation initiated by:  Letha Cape  Subjective/Objective Assessment:   dx abd pain  admit- lives with sister. pta independent.     Action/Plan:   Anticipated DC Date:  06/20/2011   Anticipated DC Plan:  HOME/SELF CARE      DC Planning Services  CM consult      Choice offered to / List presented to:             Status of service:  In process, will continue to follow Medicare Important Message given?   (If response is "NO", the following Medicare IM given date fields will be blank) Date Medicare IM given:   Date Additional Medicare IM given:    Discharge Disposition:    Per UR Regulation:    Comments:  PCP [name] [phone]  06/17/11 10:55 Letha Cape RN, BSN [phone] Patient lives with sister, pta independent, NCM will continue to follow for dc needs.

## 2011-06-17 NOTE — ED Notes (Signed)
Report given pt. Transferred to floor via stretcher with tele tech., pt. Alert and oriented, NAD noted

## 2011-06-17 NOTE — Progress Notes (Signed)
E.R. R.N. DAVID STATED ALL BELONGINGS SENT HOME WITH SISTER.

## 2011-06-17 NOTE — Progress Notes (Signed)
PT. ARRIVED VIA STRETCHER W/ E.R. STAFF. A. AND  o. ORINATED TO ROOM AND INSTR. NOT TO GET OOB CALL BELL IN REACH BELONGINGS NOT HERE FROM, E.R.

## 2011-06-17 NOTE — Progress Notes (Signed)
PT  Cancel Evaluation Note    PT evaluation cancelled today due to pt states he is closing in on his baseline functioning and does not need PT at this time--x 06/17/2011  [place] Bing, PT [phone] (pager)

## 2011-06-17 NOTE — Consult Note (Signed)
WOC consult Note Reason for Consult: req to eval for wound care for pressure ulcers to bil. LE.  Pt poor hx due to very sleepy during my assessment.  Reports he was ambulatory before this admission but noted ER notes reports family to have said he had been bed bound for last few days.  He has 2 areas LE, left heel and right lateral malleolar  Wound type: Stage II pressure ulcers, serum filled blisters consistent with areas of pressure over bony prominences   Pressure Ulcer POA: Yes x 2  Measurement: L heel: 4.0cm x 4.0 cm x 0;  R lateral malleolar area 1.0cm x 1.0cm x 0   Wound bed: both serum filled blisters  Drainage (amount, consistency, odor) none  Periwound:intact without problems  Dressing procedure/placement/frequency: silicone foam dressings applied at bedside to protect and insulate,  Will as well order Prevalon boots for pt.  Change dressings to heel and ankle every 3 days and PRN if dressing loosen.   Re consult if needed, will not follow at this time. Thanks  Monie Shere [employer], CWOCN [phone])

## 2011-06-17 NOTE — Progress Notes (Signed)
[id number] 06-17-11   PT. DID NOT QWANT ENEMA ON ARRIVAL TO FLOOR WANTED TO WAIT TIL THIS A.M. PT. GIVEN SSE AND TOL WELL. PT. HAS BEEN INSTRUCTED SEVERAL TIMES NOT TO GET OOB ALONE BED ALARM APPLIED FOR SAFETY.

## 2011-06-17 NOTE — Progress Notes (Signed)
E.R. CALLED AND WILL CHECK IN THE E.R. TO SEE IF ANY BELONGINGS FOUND. MAY HAVE GONE HOME WITH FAM. PT. TO CALL FAM. AND FIND OUT.

## 2011-06-18 DIAGNOSIS — I369 Nonrheumatic tricuspid valve disorder, unspecified: Secondary | ICD-10-CM

## 2011-06-18 LAB — URINE CULTURE
Colony Count: 4000
Culture  Setup Time: 201212201433

## 2011-06-18 MED ORDER — POLYETHYLENE GLYCOL 3350 17 G PO PACK: 17.0000 g | PACK | Freq: Every day | ORAL | Status: AC

## 2011-06-18 MED ORDER — POLYETHYLENE GLYCOL 3350 17 G PO PACK
17.0000 g | PACK | Freq: Every day | ORAL | Status: DC
Start: 2011-06-18 — End: 2011-06-18

## 2011-06-18 MED ORDER — METOPROLOL TARTRATE 25 MG PO TABS: 25.0000 mg | ORAL_TABLET | Freq: Two times a day (BID) | ORAL | Status: DC

## 2011-06-18 MED ORDER — ACETAMINOPHEN 325 MG PO TABS: 650.0000 mg | ORAL_TABLET | Freq: Four times a day (QID) | ORAL | Status: AC | PRN

## 2011-06-18 MED ORDER — CIPROFLOXACIN HCL 500 MG PO TABS: 500.0000 mg | ORAL_TABLET | Freq: Two times a day (BID) | ORAL | Status: DC

## 2011-06-18 NOTE — Progress Notes (Signed)
Pt/family given discharge instructions.

## 2011-06-18 NOTE — Progress Notes (Signed)
*  PRELIMINARY RESULTS* Echocardiogram 2D Echocardiogram has been performed.  Glean Salen [facility] 06/18/2011, 9:14 AM

## 2011-06-18 NOTE — Progress Notes (Signed)
   CARE MANAGEMENT NOTE 06/18/2011  Patient:  [name], [name]   Account Number:  [id number]  Date Initiated:  06/17/2011  Documentation initiated by:  Letha Cape  Subjective/Objective Assessment:   dx abd pain  admit- lives with sister. pta independent.     Action/Plan:   pt eval- no pt needs   Anticipated DC Date:  06/18/2011   Anticipated DC Plan:  HOME/SELF CARE      DC Planning Services  CM consult      Choice offered to / List presented to:             Status of service:  Completed, signed off Medicare Important Message given?   (If response is "NO", the following Medicare IM given date fields will be blank) Date Medicare IM given:   Date Additional Medicare IM given:    Discharge Disposition:  HOME/SELF CARE  Per UR Regulation:    Comments:  PCP Sanda Linger [phone]  06/18/11 13:51 Letha Cape RN, BSN [phone] Patient for discharge today, no needs per pt, patient is not homebound, no other needs identified.  06/17/11 10:55 Letha Cape RN, BSN [phone] Patient lives with sister, pta independent, NCM will continue to follow for dc needs.
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
## 2011-06-18 NOTE — Progress Notes (Signed)
Pt/family given discharge instructions, medication list and follow up instructions.  Pt/family verbalized understanding. Thomas Hoff
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
## 2011-06-24 ENCOUNTER — Other Ambulatory Visit (INDEPENDENT_AMBULATORY_CARE_PROVIDER_SITE_OTHER): Payer: 59

## 2011-06-24 ENCOUNTER — Ambulatory Visit (INDEPENDENT_AMBULATORY_CARE_PROVIDER_SITE_OTHER): Payer: 59 | Admitting: Internal Medicine

## 2011-06-24 VITALS — BP 118/72 | HR 80 | Temp 97.3°F | Resp 20 | Wt 155.8 lb

## 2011-06-24 DIAGNOSIS — L03119 Cellulitis of unspecified part of limb: Secondary | ICD-10-CM

## 2011-06-24 DIAGNOSIS — R945 Abnormal results of liver function studies: Secondary | ICD-10-CM

## 2011-06-24 DIAGNOSIS — M6282 Rhabdomyolysis: Secondary | ICD-10-CM

## 2011-06-24 DIAGNOSIS — D539 Nutritional anemia, unspecified: Secondary | ICD-10-CM

## 2011-06-24 DIAGNOSIS — I10 Essential (primary) hypertension: Secondary | ICD-10-CM

## 2011-06-24 DIAGNOSIS — R600 Localized edema: Secondary | ICD-10-CM

## 2011-06-24 DIAGNOSIS — L02619 Cutaneous abscess of unspecified foot: Secondary | ICD-10-CM

## 2011-06-24 DIAGNOSIS — R609 Edema, unspecified: Secondary | ICD-10-CM

## 2011-06-24 DIAGNOSIS — L97409 Non-pressure chronic ulcer of unspecified heel and midfoot with unspecified severity: Secondary | ICD-10-CM

## 2011-06-24 DIAGNOSIS — R7989 Other specified abnormal findings of blood chemistry: Secondary | ICD-10-CM

## 2011-06-24 LAB — BRAIN NATRIURETIC PEPTIDE: Pro B Natriuretic peptide (BNP): 45 pg/mL (ref 0.0–100.0)

## 2011-06-24 LAB — URINALYSIS, ROUTINE W REFLEX MICROSCOPIC
Hgb urine dipstick: NEGATIVE
Leukocytes, UA: NEGATIVE
Nitrite: NEGATIVE
Specific Gravity, Urine: 1.015 (ref 1.000–1.030)
Urobilinogen, UA: 0.2 (ref 0.0–1.0)

## 2011-06-24 LAB — CBC WITH DIFFERENTIAL/PLATELET
Basophils Relative: 0.6 % (ref 0.0–3.0)
Eosinophils Relative: 3.9 % (ref 0.0–5.0)
HCT: 35.8 % — ABNORMAL LOW (ref 39.0–52.0)
Lymphs Abs: 2.1 10*3/uL (ref 0.7–4.0)
MCV: 100.9 fl — ABNORMAL HIGH (ref 78.0–100.0)
Monocytes Absolute: 0.7 10*3/uL (ref 0.1–1.0)
Monocytes Relative: 9.6 % (ref 3.0–12.0)
Neutrophils Relative %: 55 % (ref 43.0–77.0)
RBC: 3.55 Mil/uL — ABNORMAL LOW (ref 4.22–5.81)
WBC: 6.8 10*3/uL (ref 4.5–10.5)

## 2011-06-24 LAB — COMPREHENSIVE METABOLIC PANEL
Albumin: 3.2 g/dL — ABNORMAL LOW (ref 3.5–5.2)
Alkaline Phosphatase: 82 U/L (ref 39–117)
BUN: 11 mg/dL (ref 6–23)
CO2: 26 mEq/L (ref 19–32)
GFR: 104.68 mL/min (ref >60.00)
Glucose, Bld: 91 mg/dL (ref 70–99)
Potassium: 4 mEq/L (ref 3.5–5.1)
Sodium: 141 mEq/L (ref 135–145)
Total Protein: 7.5 g/dL (ref 6.0–8.3)

## 2011-06-24 MED ORDER — AMOXICILLIN-POT CLAVULANATE 500-125 MG PO TABS: 1.0000 | ORAL_TABLET | Freq: Three times a day (TID) | ORAL | Status: AC

## 2011-06-24 NOTE — Patient Instructions (Signed)

## 2011-06-24 NOTE — Progress Notes (Signed)
Subjective:    Patient ID: [name], male    DOB: [birth year], 50 y.o.   MRN: [id number]  HPI He returns for f/up and he complains of an ulcer on his left heel that developed 2 weeks ago while he was in the hospital. He dose not recall any type of injury but says that the area is becoming more painful, red, and swollen. He was in the hospital for rhabdomyolysis recurrence and some CHF. It does not appear that a specific cause for the rhabdo was found but I am concerned that he is staying in the bed most of the time and that has caused the heel ulcer and rhabdo, He does not have any muscle or joint pain today.   Review of Systems  Constitutional: Positive for unexpected weight change (some weight gain). Negative for fever, chills, diaphoresis, activity change, appetite change and fatigue.  HENT: Negative.   Eyes: Negative.   Respiratory: Positive for shortness of breath. Negative for cough, chest tightness, wheezing and stridor.   Cardiovascular: Negative for chest pain, palpitations and leg swelling.  Gastrointestinal: Negative for nausea, vomiting, abdominal pain, diarrhea, constipation, blood in stool, abdominal distention, anal bleeding and rectal pain.  Genitourinary: Negative for dysuria, urgency, frequency, hematuria, flank pain, decreased urine volume, enuresis and difficulty urinating.  Musculoskeletal: Positive for joint swelling (left heel) and arthralgias. Negative for myalgias, back pain and gait problem.  Skin: Positive for wound. Negative for color change, pallor and rash.  Neurological: Positive for weakness (all over) and light-headedness. Negative for dizziness, tremors, seizures, syncope, facial asymmetry, speech difficulty, numbness and headaches.  Hematological: Negative for adenopathy. Does not bruise/bleed easily.  Psychiatric/Behavioral: Positive for dysphoric mood and decreased concentration. Negative for suicidal ideas, hallucinations, behavioral problems,  confusion, sleep disturbance, self-injury and agitation. The patient is nervous/anxious. The patient is not hyperactive.        Objective:   Physical Exam  Vitals reviewed. Constitutional: He is oriented to person, place, and time. He appears well-developed and well-nourished. No distress.  HENT:  Head: Normocephalic and atraumatic.  Mouth/Throat: Oropharynx is clear and moist. No oropharyngeal exudate.  Eyes: Conjunctivae are normal. Right eye exhibits no discharge. Left eye exhibits no discharge. No scleral icterus.  Neck: Normal range of motion. Neck supple. No JVD present. No tracheal deviation present. No thyromegaly present.  Cardiovascular: Normal rate, regular rhythm, normal heart sounds and intact distal pulses.  Exam reveals no gallop and no friction rub.   No murmur heard. Pulmonary/Chest: Effort normal and breath sounds normal. No stridor. No respiratory distress. He has no wheezes. He has no rales. He exhibits no tenderness.  Abdominal: Soft. Bowel sounds are normal. He exhibits no distension and no mass. There is no tenderness. There is no rebound and no guarding.  Musculoskeletal: Normal range of motion. He exhibits no edema and no tenderness.       Left ankle: He exhibits swelling. He exhibits normal range of motion, no ecchymosis, no deformity, no laceration and normal pulse. tenderness. Achilles tendon normal. Achilles tendon exhibits no pain and no defect.       Feet:  Lymphadenopathy:    He has no cervical adenopathy.  Neurological: He is oriented to person, place, and time.  Skin: Skin is warm and dry. No rash noted. He is not diaphoretic. No erythema. No pallor.  Psychiatric: Judgment and thought content normal. His mood appears not anxious. His affect is not angry, not blunt, not labile and not inappropriate. His speech is not  rapid and/or pressured, not delayed, not tangential and not slurred. He is withdrawn. He is not agitated, not aggressive, is not hyperactive,  not slowed, not actively hallucinating and not combative. Cognition and memory are normal. Cognition and memory are not impaired. He does not express impulsivity or inappropriate judgment. He exhibits a depressed mood. He is communicative. He exhibits normal recent memory and normal remote memory. He is attentive.      Lab Results  Component Value Date   WBC 6.5 06/17/2011   HGB 12.1* 06/17/2011   HCT 34.7* 06/17/2011   PLT 236 06/17/2011   GLUCOSE 77 06/17/2011   CHOL 134 04/04/2011   TRIG 143 04/04/2011   HDL 28* 04/04/2011   LDLDIRECT 52.5 11/30/2010   LDLCALC 77 04/04/2011   ALT 16 06/17/2011   AST 22 06/17/2011   NA 133* 06/17/2011   K 3.8 06/17/2011   CL 96 06/17/2011   CREATININE 0.95 06/17/2011   BUN 13 06/17/2011   CO2 28 06/17/2011   TSH 1.09 06/10/2011   INR 1.16 04/02/2011   HGBA1C 5.3 04/04/2011      Assessment & Plan:

## 2011-06-25 ENCOUNTER — Encounter: Payer: Self-pay | Admitting: Internal Medicine

## 2011-06-25 LAB — DRUGS OF ABUSE SCREEN W/O ALC, ROUTINE URINE
Benzodiazepines.: POSITIVE — AB
Creatinine,U: 170.9 mg/dL
Marijuana Metabolite: NEGATIVE
Methadone: NEGATIVE
Phencyclidine (PCP): NEGATIVE
Propoxyphene: NEGATIVE

## 2011-06-25 LAB — MYOGLOBIN, SERUM: Myoglobin: 40 ng/mL (ref <111)

## 2011-06-25 LAB — ANA: Anti Nuclear Antibody(ANA): NEGATIVE

## 2011-06-25 LAB — CK TOTAL AND CKMB (NOT AT ARMC)

## 2011-06-25 LAB — RHEUMATOID FACTOR: Rhuematoid fact SerPl-aCnc: <10 IU/mL (ref <=14)

## 2011-06-25 NOTE — Assessment & Plan Note (Signed)
Foot ortho referral

## 2011-06-25 NOTE — Assessment & Plan Note (Signed)
I will recheck his LFT's today 

## 2011-06-25 NOTE — Assessment & Plan Note (Signed)
This has improved, I will check his BNP today to see if he is in failure with fluid retention

## 2011-06-25 NOTE — Assessment & Plan Note (Signed)
Start augmentin for the infection and ask a foot surgeon to check for possible debridement or other conservative treatment

## 2011-06-25 NOTE — Assessment & Plan Note (Signed)
Recheck his CBC today 

## 2011-06-25 NOTE — Assessment & Plan Note (Signed)
This has improved, I will recheck his CPK today and will screen him for connective tissue disease

## 2011-06-25 NOTE — Progress Notes (Signed)
Utilization Review Completed.[name] T12/28/2012   

## 2011-06-29 HISTORY — PX: SHOULDER ARTHROSCOPY W/ ROTATOR CUFF REPAIR: SHX2400

## 2011-06-29 LAB — BENZODIAZEPINE, QUANTITATIVE, URINE
Lorazepam: NEGATIVE NG/ML
Nordiazepam GC/MS Conf: NEGATIVE NG/ML
Oxazepam GC/MS Conf: NEGATIVE NG/ML
Temazapam: NEGATIVE NG/ML

## 2011-07-01 DIAGNOSIS — Z0279 Encounter for issue of other medical certificate: Secondary | ICD-10-CM

## 2011-07-02 ENCOUNTER — Telehealth: Payer: Self-pay

## 2011-07-02 NOTE — Telephone Encounter (Signed)
Pt called requesting a note to release back to work 01/07 per his employer.

## 2011-07-02 NOTE — Telephone Encounter (Signed)
He needs to run this by his psychiatrist Dr. Evelene Croon

## 2011-07-02 NOTE — Telephone Encounter (Signed)
If she has said then she needs to tell him where to go for psych care, who to see about ECT, we need her help on this

## 2011-07-02 NOTE — Telephone Encounter (Signed)
Patient called [facility] stating that pharmacy informed him that klonopin refill was denied and need PA from insurance.

## 2011-07-02 NOTE — Telephone Encounter (Signed)
Patient notified per MD but states that she will not prescribe meds anymore.

## 2011-07-02 NOTE — Telephone Encounter (Signed)
I don't think he is ready to go back yet

## 2011-07-02 NOTE — Telephone Encounter (Signed)
Patient notified per MD regarding not returning back to work on 07/05/11. Patient would like to know if MD will consider switching klonopin to something bcuase its not helping very much and because "he is having a hard time". Please advise, insurance will no longer cover klonopin w/o a PA

## 2011-07-08 ENCOUNTER — Ambulatory Visit (INDEPENDENT_AMBULATORY_CARE_PROVIDER_SITE_OTHER): Payer: 59 | Admitting: Internal Medicine

## 2011-07-08 ENCOUNTER — Encounter: Payer: Self-pay | Admitting: Internal Medicine

## 2011-07-08 VITALS — BP 130/82 | HR 63 | Temp 97.8°F | Wt 156.0 lb

## 2011-07-08 DIAGNOSIS — M6282 Rhabdomyolysis: Secondary | ICD-10-CM

## 2011-07-08 DIAGNOSIS — L03119 Cellulitis of unspecified part of limb: Secondary | ICD-10-CM

## 2011-07-08 DIAGNOSIS — L97409 Non-pressure chronic ulcer of unspecified heel and midfoot with unspecified severity: Secondary | ICD-10-CM

## 2011-07-08 DIAGNOSIS — I739 Peripheral vascular disease, unspecified: Secondary | ICD-10-CM

## 2011-07-08 DIAGNOSIS — F418 Other specified anxiety disorders: Secondary | ICD-10-CM

## 2011-07-08 DIAGNOSIS — F341 Dysthymic disorder: Secondary | ICD-10-CM

## 2011-07-08 DIAGNOSIS — F419 Anxiety disorder, unspecified: Secondary | ICD-10-CM

## 2011-07-08 DIAGNOSIS — F411 Generalized anxiety disorder: Secondary | ICD-10-CM

## 2011-07-08 DIAGNOSIS — F329 Major depressive disorder, single episode, unspecified: Secondary | ICD-10-CM

## 2011-07-08 MED ORDER — AMOXICILLIN-POT CLAVULANATE 500-125 MG PO TABS: 1.0000 | ORAL_TABLET | Freq: Three times a day (TID) | ORAL | Status: AC

## 2011-07-08 MED ORDER — DIAZEPAM 5 MG PO TABS: 5.0000 mg | ORAL_TABLET | Freq: Three times a day (TID) | ORAL | Status: AC | PRN

## 2011-07-08 NOTE — Assessment & Plan Note (Signed)
I am concerned about the poor healing so I will check the blood flow with arterial doppler studies and have restarted the augmentin

## 2011-07-08 NOTE — Assessment & Plan Note (Signed)
I have changed klonopin to valium at his request, also I have asked him to see Dr. Dub Mikes for a second opinion, he will continue taking symbyax

## 2011-07-08 NOTE — Assessment & Plan Note (Signed)
He previously had a good response to augmentin so will restart it

## 2011-07-08 NOTE — Assessment & Plan Note (Signed)
It appears that this has resolved 

## 2011-07-08 NOTE — Patient Instructions (Signed)

## 2011-07-08 NOTE — Progress Notes (Signed)
Subjective:    Patient ID: [name], male    DOB: June 27, 1961, 51 y.o.   MRN: [id number]  HPI He returns c/o that the ulcer on his left foot is not healing as quickly as he thought it should. He took his last dose of augmentin 6 days ago and now he feels like the infection is returning. While he was taking augmentin the pain, redness, and swelling has resolved but now it is returning. He can't bear weight on the left foot for more than a few minutes b/c the pain becomes unbearable. He does not experience any claudication. He did not try to make an appt with Dr. Victorino Dike. Also, he thinks he needs a new psychiatrist as he tells me that Dr. Evelene Croon has told him that she does not have anything to offer him and she wants him to undergo ECT but he is afraid of ECT and does not want to pursue that option. I have been asked to change the klonopin to an alternative due to a formulary issue with klonopin. He feels like klonopin controls his anxiety and panic for 4-6 hours but then he feels like he needs another dose. He tells me that he is taking symbyax.   Review of Systems  Constitutional: Positive for fatigue. Negative for fever, chills, diaphoresis, activity change and appetite change.  HENT: Negative.   Eyes: Negative.   Respiratory: Negative for cough, chest tightness, shortness of breath, wheezing and stridor.   Cardiovascular: Negative for chest pain, palpitations and leg swelling.  Gastrointestinal: Negative.   Genitourinary: Negative.   Musculoskeletal: Negative for myalgias, back pain, joint swelling, arthralgias and gait problem.  Skin: Positive for wound (left heel). Negative for color change, pallor and rash.  Neurological: Negative for dizziness, tremors, seizures, syncope, facial asymmetry, speech difficulty, weakness, light-headedness, numbness and headaches.  Hematological: Negative for adenopathy. Does not bruise/bleed easily.  Psychiatric/Behavioral: Positive for sleep disturbance,  dysphoric mood and decreased concentration. Negative for suicidal ideas, hallucinations, behavioral problems, confusion, self-injury and agitation. The patient is nervous/anxious. The patient is not hyperactive.        Objective:   Physical Exam  Vitals reviewed. Constitutional: He is oriented to person, place, and time. He appears well-developed and well-nourished. No distress.  HENT:  Head: Normocephalic and atraumatic.  Mouth/Throat: No oropharyngeal exudate.  Eyes: Conjunctivae are normal. Right eye exhibits no discharge. Left eye exhibits no discharge. No scleral icterus.  Neck: Normal range of motion. Neck supple. No JVD present. No tracheal deviation present. No thyromegaly present.  Cardiovascular: Normal rate, regular rhythm, normal heart sounds and intact distal pulses.  Exam reveals no gallop and no friction rub.   No murmur heard. Pulses:      Carotid pulses are 1+ on the right side, and 1+ on the left side.      Radial pulses are 1+ on the right side, and 1+ on the left side.       Femoral pulses are 1+ on the right side, and 1+ on the left side.      Popliteal pulses are 1+ on the right side, and 1+ on the left side.       Dorsalis pedis pulses are 1+ on the right side, and 1+ on the left side.       Posterior tibial pulses are 0 on the right side, and 0 on the left side.  Pulmonary/Chest: Effort normal and breath sounds normal. No stridor. No respiratory distress. He has no wheezes. He has  no rales. He exhibits no tenderness.  Abdominal: Soft. Bowel sounds are normal. He exhibits no distension and no mass. There is no tenderness. There is no rebound and no guarding.  Musculoskeletal: Normal range of motion. He exhibits no edema and no tenderness.       Feet:       The left heel ulcer does not show any necrotic tissue or induration.  Lymphadenopathy:    He has no cervical adenopathy.  Neurological: He is oriented to person, place, and time.  Skin: Skin is warm and dry. No  rash noted. He is not diaphoretic. No erythema. No pallor.  Psychiatric: He has a normal mood and affect. His behavior is normal. Judgment and thought content normal.      Lab Results  Component Value Date   WBC 6.8 06/24/2011   HGB 12.5* 06/24/2011   HCT 35.8* 06/24/2011   PLT 530.0* 06/24/2011   GLUCOSE 91 06/24/2011   CHOL 134 04/04/2011   TRIG 143 04/04/2011   HDL 28* 04/04/2011   LDLDIRECT 52.5 11/30/2010   LDLCALC 77 04/04/2011   ALT 20 06/24/2011   AST 21 06/24/2011   NA 141 06/24/2011   K 4.0 06/24/2011   CL 106 06/24/2011   CREATININE 0.8 06/24/2011   BUN 11 06/24/2011   CO2 26 06/24/2011   TSH 1.09 06/10/2011   INR 1.16 04/02/2011   HGBA1C 5.3 04/04/2011      Assessment & Plan:

## 2011-07-08 NOTE — Assessment & Plan Note (Signed)
I have asked him to have abi's done to see how severe the PAD is

## 2011-07-12 ENCOUNTER — Ambulatory Visit: Payer: Self-pay

## 2011-07-13 ENCOUNTER — Ambulatory Visit: Payer: 59 | Admitting: Internal Medicine

## 2011-07-15 ENCOUNTER — Ambulatory Visit (INDEPENDENT_AMBULATORY_CARE_PROVIDER_SITE_OTHER): Payer: 59 | Admitting: Internal Medicine

## 2011-07-15 ENCOUNTER — Encounter: Payer: Self-pay | Admitting: Internal Medicine

## 2011-07-15 DIAGNOSIS — F418 Other specified anxiety disorders: Secondary | ICD-10-CM

## 2011-07-15 DIAGNOSIS — I739 Peripheral vascular disease, unspecified: Secondary | ICD-10-CM

## 2011-07-15 DIAGNOSIS — I2510 Atherosclerotic heart disease of native coronary artery without angina pectoris: Secondary | ICD-10-CM

## 2011-07-15 DIAGNOSIS — R609 Edema, unspecified: Secondary | ICD-10-CM

## 2011-07-15 DIAGNOSIS — F341 Dysthymic disorder: Secondary | ICD-10-CM

## 2011-07-15 DIAGNOSIS — R600 Localized edema: Secondary | ICD-10-CM

## 2011-07-15 DIAGNOSIS — L97409 Non-pressure chronic ulcer of unspecified heel and midfoot with unspecified severity: Secondary | ICD-10-CM

## 2011-07-15 DIAGNOSIS — M6282 Rhabdomyolysis: Secondary | ICD-10-CM

## 2011-07-15 DIAGNOSIS — F17200 Nicotine dependence, unspecified, uncomplicated: Secondary | ICD-10-CM

## 2011-07-15 DIAGNOSIS — D539 Nutritional anemia, unspecified: Secondary | ICD-10-CM

## 2011-07-15 DIAGNOSIS — R079 Chest pain, unspecified: Secondary | ICD-10-CM

## 2011-07-15 DIAGNOSIS — F1910 Other psychoactive substance abuse, uncomplicated: Secondary | ICD-10-CM | POA: Insufficient documentation

## 2011-07-15 DIAGNOSIS — I10 Essential (primary) hypertension: Secondary | ICD-10-CM

## 2011-07-15 DIAGNOSIS — L03119 Cellulitis of unspecified part of limb: Secondary | ICD-10-CM

## 2011-07-15 NOTE — Progress Notes (Signed)
Subjective:    Patient ID: [name], male    DOB: May 13, 1961, 51 y.o.   MRN: [id number]  HPI He returns for f/up and he tells me that he wants to go back to work tomorrow. He feels like the ulcer on his left heel is healing well. He does have some muscle aches all over today. He feels like the symbyax has caused weight gain and he is thinking of stopping it.    Review of Systems  Constitutional: Positive for fatigue and unexpected weight change. Negative for fever, chills, diaphoresis, activity change and appetite change.  HENT: Negative.   Eyes: Negative.   Respiratory: Negative for cough, chest tightness, shortness of breath, wheezing and stridor.   Cardiovascular: Negative for chest pain, palpitations and leg swelling.  Gastrointestinal: Negative for nausea, abdominal pain, diarrhea, constipation, blood in stool, abdominal distention, anal bleeding and rectal pain.  Genitourinary: Negative for dysuria, urgency, frequency, hematuria, flank pain, decreased urine volume, enuresis and difficulty urinating.  Musculoskeletal: Positive for myalgias and arthralgias. Negative for back pain, joint swelling and gait problem.  Skin: Positive for wound (left heel). Negative for color change, pallor and rash.  Neurological: Negative for dizziness, tremors, seizures, syncope, facial asymmetry, speech difficulty, weakness, light-headedness, numbness and headaches.  Hematological: Negative for adenopathy. Does not bruise/bleed easily.  Psychiatric/Behavioral: Positive for confusion, sleep disturbance, dysphoric mood and decreased concentration. Negative for suicidal ideas, hallucinations, behavioral problems, self-injury and agitation. The patient is nervous/anxious. The patient is not hyperactive.        Objective:   Physical Exam  Vitals reviewed. Constitutional: He is oriented to person, place, and time. He appears well-developed and well-nourished. No distress.  HENT:  Head: Normocephalic  and atraumatic.  Mouth/Throat: Oropharynx is clear and moist. No oropharyngeal exudate.  Eyes: Conjunctivae are normal. Right eye exhibits no discharge. Left eye exhibits no discharge. No scleral icterus.  Neck: Normal range of motion. Neck supple. No JVD present. No tracheal deviation present. No thyromegaly present.  Cardiovascular: Normal rate, regular rhythm, normal heart sounds and intact distal pulses.  Exam reveals no gallop and no friction rub.   No murmur heard. Pulmonary/Chest: Effort normal and breath sounds normal. No stridor. No respiratory distress. He has no wheezes. He has no rales. He exhibits no tenderness.  Abdominal: Soft. Bowel sounds are normal. He exhibits no distension and no mass. There is no tenderness. There is no rebound and no guarding.  Musculoskeletal: Normal range of motion. He exhibits no edema and no tenderness.       Left foot: He exhibits normal range of motion, no tenderness, no bony tenderness and no swelling.       Feet:  Lymphadenopathy:    He has no cervical adenopathy.  Neurological: He is oriented to person, place, and time.  Skin: Skin is warm and dry. No rash noted. He is not diaphoretic. No erythema. No pallor.  Psychiatric: He has a normal mood and affect. Thought content normal. His mood appears not anxious. His affect is not angry, not blunt, not labile and not inappropriate. His speech is delayed. His speech is not rapid and/or pressured, not tangential and not slurred. He is slowed and withdrawn. He is not agitated, not aggressive, is not hyperactive, not actively hallucinating and not combative. Thought content is not paranoid and not delusional. Cognition and memory are normal. Cognition and memory are not impaired. He does not express impulsivity or inappropriate judgment. He does not exhibit a depressed mood. He expresses no  homicidal and no suicidal ideation. He expresses no suicidal plans and no homicidal plans. He is communicative. He exhibits  normal recent memory and normal remote memory. He is inattentive.      Lab Results  Component Value Date   WBC 6.8 06/24/2011   HGB 12.5* 06/24/2011   HCT 35.8* 06/24/2011   PLT 530.0* 06/24/2011   GLUCOSE 91 06/24/2011   CHOL 134 04/04/2011   TRIG 143 04/04/2011   HDL 28* 04/04/2011   LDLDIRECT 52.5 11/30/2010   LDLCALC 77 04/04/2011   ALT 20 06/24/2011   AST 21 06/24/2011   NA 141 06/24/2011   K 4.0 06/24/2011   CL 106 06/24/2011   CREATININE 0.8 06/24/2011   BUN 11 06/24/2011   CO2 26 06/24/2011   TSH 1.09 06/10/2011   INR 1.16 04/02/2011   HGBA1C 5.3 04/04/2011      Assessment & Plan:

## 2011-07-16 ENCOUNTER — Telehealth: Payer: Self-pay | Admitting: *Deleted

## 2011-07-16 ENCOUNTER — Encounter: Payer: Self-pay | Admitting: Internal Medicine

## 2011-07-16 NOTE — Assessment & Plan Note (Signed)
He tells me that he has not abused alcohol or THC in one month, I will check his UDS today

## 2011-07-16 NOTE — Assessment & Plan Note (Signed)
I will check his CPK level and will monitor his renal and liver function

## 2011-07-16 NOTE — Telephone Encounter (Signed)
Patient called office w/chest pain & SOB [seen 01.17.13]; per MD instructed to report to ED for A&E [has past Hx of MI]. Patient understood & agreed.

## 2011-07-16 NOTE — Assessment & Plan Note (Signed)
He will call his psychiatrist Dr. Evelene Croon to ask about a change in meds

## 2011-07-16 NOTE — Assessment & Plan Note (Deleted)
This is improving on augmentin

## 2011-07-16 NOTE — Assessment & Plan Note (Signed)
His BP is well controlled 

## 2011-07-16 NOTE — Assessment & Plan Note (Signed)
This has improved.

## 2011-07-16 NOTE — Assessment & Plan Note (Signed)
I reminded him to get his arterial flow study done

## 2011-07-16 NOTE — Assessment & Plan Note (Signed)
I asked him to quit smoking 

## 2011-07-16 NOTE — Assessment & Plan Note (Signed)
This appears to be healing, he tells me that he has an appt with Dr. Victorino Dike about this next week

## 2011-07-16 NOTE — Assessment & Plan Note (Signed)
I will recheck his CBC today 

## 2011-07-16 NOTE — Assessment & Plan Note (Signed)
This is improving.  

## 2011-07-26 ENCOUNTER — Ambulatory Visit (INDEPENDENT_AMBULATORY_CARE_PROVIDER_SITE_OTHER)
Admission: RE | Admit: 2011-07-26 | Discharge: 2011-07-26 | Disposition: A | Discharge status: Home / Self Care | Payer: 59 | Source: Ambulatory Visit | Attending: Internal Medicine | Admitting: Internal Medicine

## 2011-07-26 ENCOUNTER — Encounter (INDEPENDENT_AMBULATORY_CARE_PROVIDER_SITE_OTHER): Payer: 59 | Admitting: *Deleted

## 2011-07-26 ENCOUNTER — Ambulatory Visit (INDEPENDENT_AMBULATORY_CARE_PROVIDER_SITE_OTHER): Payer: 59 | Admitting: Internal Medicine

## 2011-07-26 ENCOUNTER — Other Ambulatory Visit (INDEPENDENT_AMBULATORY_CARE_PROVIDER_SITE_OTHER): Payer: 59

## 2011-07-26 ENCOUNTER — Encounter: Payer: Self-pay | Admitting: Internal Medicine

## 2011-07-26 VITALS — BP 136/88 | HR 76 | Temp 97.0°F | Resp 16 | Wt 160.0 lb

## 2011-07-26 DIAGNOSIS — M545 Low back pain, unspecified: Secondary | ICD-10-CM

## 2011-07-26 DIAGNOSIS — L03119 Cellulitis of unspecified part of limb: Secondary | ICD-10-CM

## 2011-07-26 DIAGNOSIS — L97409 Non-pressure chronic ulcer of unspecified heel and midfoot with unspecified severity: Secondary | ICD-10-CM

## 2011-07-26 DIAGNOSIS — R05 Cough: Secondary | ICD-10-CM

## 2011-07-26 DIAGNOSIS — R300 Dysuria: Secondary | ICD-10-CM

## 2011-07-26 DIAGNOSIS — M6282 Rhabdomyolysis: Secondary | ICD-10-CM

## 2011-07-26 DIAGNOSIS — D539 Nutritional anemia, unspecified: Secondary | ICD-10-CM

## 2011-07-26 DIAGNOSIS — R10816 Epigastric abdominal tenderness: Secondary | ICD-10-CM

## 2011-07-26 DIAGNOSIS — F341 Dysthymic disorder: Secondary | ICD-10-CM

## 2011-07-26 DIAGNOSIS — L02619 Cutaneous abscess of unspecified foot: Secondary | ICD-10-CM

## 2011-07-26 DIAGNOSIS — F418 Other specified anxiety disorders: Secondary | ICD-10-CM

## 2011-07-26 DIAGNOSIS — I739 Peripheral vascular disease, unspecified: Secondary | ICD-10-CM

## 2011-07-26 DIAGNOSIS — I10 Essential (primary) hypertension: Secondary | ICD-10-CM

## 2011-07-26 LAB — URINALYSIS, ROUTINE W REFLEX MICROSCOPIC
Hgb urine dipstick: NEGATIVE
Nitrite: NEGATIVE
Urobilinogen, UA: 0.2 (ref 0.0–1.0)

## 2011-07-26 LAB — COMPREHENSIVE METABOLIC PANEL
Albumin: 3.7 g/dL (ref 3.5–5.2)
BUN: 15 mg/dL (ref 6–23)
Calcium: 9.3 mg/dL (ref 8.4–10.5)
Chloride: 106 mEq/L (ref 96–112)
Glucose, Bld: 84 mg/dL (ref 70–99)
Potassium: 3.9 mEq/L (ref 3.5–5.1)

## 2011-07-26 LAB — CBC WITH DIFFERENTIAL/PLATELET
Basophils Relative: 0.2 % (ref 0.0–3.0)
Eosinophils Relative: 4.2 % (ref 0.0–5.0)
Hemoglobin: 13.9 g/dL (ref 13.0–17.0)
Lymphocytes Relative: 32.2 % (ref 12.0–46.0)
MCV: 102 fl — ABNORMAL HIGH (ref 78.0–100.0)
Monocytes Absolute: 0.4 10*3/uL (ref 0.1–1.0)
Neutrophils Relative %: 56.3 % (ref 43.0–77.0)
RBC: 3.89 Mil/uL — ABNORMAL LOW (ref 4.22–5.81)
WBC: 5.1 10*3/uL (ref 4.5–10.5)

## 2011-07-26 NOTE — Progress Notes (Signed)
Subjective:    Patient ID: [name], male    DOB: Apr 25, 1961, 51 y.o.   MRN: [id number]  Back Pain This is a new problem. The current episode started in the past 7 days. The problem occurs intermittently. The problem is unchanged. The pain is present in the lumbar spine. The quality of the pain is described as aching. The pain does not radiate. The pain is at a severity of 2/10. The pain is mild. The pain is worse during the day. The symptoms are aggravated by bending. Stiffness is present in the morning. Associated symptoms include dysuria. Pertinent negatives include no abdominal pain, bladder incontinence, bowel incontinence, chest pain, fever, headaches, leg pain, numbness, paresis, paresthesias, pelvic pain, perianal numbness, tingling, weakness or weight loss. Risk factors include sedentary lifestyle. He has tried nothing for the symptoms.      Review of Systems  Constitutional: Negative for fever, chills, weight loss, diaphoresis, activity change, appetite change, fatigue and unexpected weight change.  HENT: Negative.   Eyes: Negative.   Respiratory: Negative for apnea, cough, choking, chest tightness, shortness of breath, wheezing and stridor.   Cardiovascular: Negative for chest pain, palpitations and leg swelling.  Gastrointestinal: Positive for diarrhea. Negative for nausea, vomiting, abdominal pain, constipation, blood in stool, abdominal distention, anal bleeding, rectal pain and bowel incontinence.  Genitourinary: Positive for dysuria. Negative for bladder incontinence, urgency, frequency, hematuria, flank pain, decreased urine volume, discharge, penile swelling, scrotal swelling, enuresis, difficulty urinating, genital sores, penile pain, testicular pain and pelvic pain.  Musculoskeletal: Positive for back pain. Negative for myalgias, joint swelling, arthralgias and gait problem.  Skin: Negative for color change, pallor, rash and wound.  Neurological: Negative for  dizziness, tingling, tremors, seizures, syncope, facial asymmetry, speech difficulty, weakness, light-headedness, numbness, headaches and paresthesias.  Hematological: Negative for adenopathy. Does not bruise/bleed easily.  Psychiatric/Behavioral: Positive for confusion, dysphoric mood and decreased concentration. Negative for suicidal ideas, hallucinations, behavioral problems, sleep disturbance, self-injury and agitation. The patient is nervous/anxious. The patient is not hyperactive.        Objective:   Physical Exam  Vitals reviewed. Constitutional: He is oriented to person, place, and time. He appears well-developed and well-nourished. No distress.  HENT:  Head: Normocephalic and atraumatic.  Mouth/Throat: Oropharynx is clear and moist. No oropharyngeal exudate.  Eyes: Conjunctivae are normal. Right eye exhibits no discharge. Left eye exhibits no discharge. No scleral icterus.  Neck: Normal range of motion. Neck supple. No JVD present. No tracheal deviation present. No thyromegaly present.  Cardiovascular: Normal rate, regular rhythm, normal heart sounds and intact distal pulses.  Exam reveals no gallop and no friction rub.   No murmur heard. Pulmonary/Chest: Effort normal and breath sounds normal. No stridor. No respiratory distress. He has no wheezes. He has no rales. He exhibits no tenderness.  Abdominal: Soft. Bowel sounds are normal. He exhibits no distension and no mass. There is no tenderness. There is no rebound and no guarding. Hernia confirmed negative in the right inguinal area and confirmed negative in the left inguinal area.  Genitourinary: Rectum normal, prostate normal, testes normal and penis normal. Rectal exam shows no external hemorrhoid, no internal hemorrhoid, no fissure, no mass, no tenderness and anal tone normal. Guaiac negative stool. Prostate is not enlarged and not tender. Right testis shows no mass, no swelling and no tenderness. Right testis is descended. Left  testis shows no mass, no swelling and no tenderness. Left testis is descended. Circumcised. No penile tenderness. No discharge found.  Musculoskeletal:  Normal range of motion. He exhibits no edema and no tenderness.       Left foot: He exhibits deformity. He exhibits normal range of motion, no tenderness, no bony tenderness, no swelling, normal capillary refill, no crepitus and no laceration.       The ulcer on his left heel has granulated, there is no erythema/exudate/warmth/swelling/streaking  Lymphadenopathy:    He has no cervical adenopathy.       Right: No inguinal adenopathy present.       Left: No inguinal adenopathy present.  Neurological: He is alert and oriented to person, place, and time. He has normal reflexes. He displays normal reflexes. No cranial nerve deficit. He exhibits normal muscle tone. Coordination normal.  Skin: Skin is warm and dry. No rash noted. He is not diaphoretic. No erythema. No pallor.  Psychiatric: Judgment and thought content normal. His mood appears not anxious. His affect is not angry, not blunt, not labile and not inappropriate. His speech is delayed and tangential. His speech is not rapid and/or pressured and not slurred. He is slowed and withdrawn. He is not agitated, not aggressive, is not hyperactive, not actively hallucinating and not combative. Thought content is not paranoid and not delusional. Cognition and memory are impaired. He does not express impulsivity or inappropriate judgment. He exhibits a depressed mood. He expresses no homicidal and no suicidal ideation. He expresses no suicidal plans and no homicidal plans. He is communicative. He is inattentive.      Lab Results  Component Value Date   WBC 6.8 06/24/2011   HGB 12.5* 06/24/2011   HCT 35.8* 06/24/2011   PLT 530.0* 06/24/2011   GLUCOSE 91 06/24/2011   CHOL 134 04/04/2011   TRIG 143 04/04/2011   HDL 28* 04/04/2011   LDLDIRECT 52.5 11/30/2010   LDLCALC 77 04/04/2011   ALT 20 06/24/2011    AST 21 06/24/2011   NA 141 06/24/2011   K 4.0 06/24/2011   CL 106 06/24/2011   CREATININE 0.8 06/24/2011   BUN 11 06/24/2011   CO2 26 06/24/2011   TSH 1.09 06/10/2011   INR 1.16 04/02/2011   HGBA1C 5.3 04/04/2011      Assessment & Plan:

## 2011-07-26 NOTE — Patient Instructions (Signed)
Diarrhea Infections caused by germs (bacterial) or a virus commonly cause diarrhea. Your caregiver has determined that with time, rest and fluids, the diarrhea should improve. In general, eat normally while drinking more water than usual. Although water may prevent dehydration, it does not contain salt and minerals (electrolytes). Broths, weak tea without caffeine and oral rehydration solutions (ORS) replace fluids and electrolytes. Small amounts of fluids should be taken frequently. Large amounts at one time may not be tolerated. Plain water may be harmful in infants and the elderly. Oral rehydrating solutions (ORS) are available at pharmacies and grocery stores. ORS replace water and important electrolytes in proper proportions. Sports drinks are not as effective as ORS and may be harmful due to sugars worsening diarrhea.  ORS is especially recommended for use in children with diarrhea. As a general guideline for children, replace any new fluid losses from diarrhea and/or vomiting with ORS as follows:   If your child weighs 22 pounds or under (10 kg or less), give 60-120 mL ( -  cup or 2 - 4 ounces) of ORS for each episode of diarrheal stool or vomiting episode.   If your child weighs more than 22 pounds (more than 10 kgs), give 120-240 mL ( - 1 cup or 4 - 8 ounces) of ORS for each diarrheal stool or episode of vomiting.   While correcting for dehydration, children should eat normally. However, foods high in sugar should be avoided because this may worsen diarrhea. Large amounts of carbonated soft drinks, juice, gelatin desserts and other highly sugared drinks should be avoided.   After correction of dehydration, other liquids that are appealing to the child may be added. Children should drink small amounts of fluids frequently and fluids should be increased as tolerated. Children should drink enough fluids to keep urine clear or pale yellow.   Adults should eat normally while drinking more fluids  than usual. Drink small amounts of fluids frequently and increase as tolerated. Drink enough fluids to keep urine clear or pale yellow. Broths, weak decaffeinated tea, lemon lime soft drinks (allowed to go flat) and ORS replace fluids and electrolytes.   Avoid:   Carbonated drinks.   Juice.   Extremely hot or cold fluids.   Caffeine drinks.   Fatty, greasy foods.   Alcohol.   Tobacco.   Too much intake of anything at one time.   Gelatin desserts.   Probiotics are active cultures of beneficial bacteria. They may lessen the amount and number of diarrheal stools in adults. Probiotics can be found in yogurt with active cultures and in supplements.   Wash hands well to avoid spreading bacteria and virus.   Anti-diarrheal medications are not recommended for infants and children.   Only take over-the-counter or prescription medicines for pain, discomfort or fever as directed by your caregiver. Do not give aspirin to children because it may cause Reye's Syndrome.   For adults, ask your caregiver if you should continue all prescribed and over-the-counter medicines.   If your caregiver has given you a follow-up appointment, it is very important to keep that appointment. Not keeping the appointment could result in a chronic or permanent injury, and disability. If there is any problem keeping the appointment, you must call back to this facility for assistance.  SEEK IMMEDIATE MEDICAL CARE IF:   You or your child is unable to keep fluids down or other symptoms or problems become worse in spite of treatment.   Vomiting or diarrhea develops and becomes persistent.     There is vomiting of blood or bile (green material).   There is blood in the stool or the stools are black and tarry.   There is no urine output in 6-8 hours or there is only a small amount of very dark urine.   Abdominal pain develops, increases or localizes.   You have a fever.   Your baby is older than 3 months with a  rectal temperature of 102 F (38.9 C) or higher.   Your baby is 12 months old or younger with a rectal temperature of 100.4 F (38 C) or higher.   You or your child develops excessive weakness, dizziness, fainting or extreme thirst.   You or your child develops a rash, stiff neck, severe headache or become irritable or sleepy and difficult to awaken.  MAKE SURE YOU:   Understand these instructions.   Will watch your condition.   Will get help right away if you are not doing well or get worse.  Document Released: 06/04/2002 Document Revised: 02/24/2011 Document Reviewed: 04/21/2009 [facility] Patient Information 2012 [place], [place].Back Pain, Adult Low back pain is very common. About 1 in 5 people have back pain.The cause of low back pain is rarely dangerous. The pain often gets better over time.About half of people with a sudden onset of back pain feel better in just 2 weeks. About 8 in 10 people feel better by 6 weeks.  CAUSES Some common causes of back pain include:  Strain of the muscles or ligaments supporting the spine.   Wear and tear (degeneration) of the spinal discs.   Arthritis.   Direct injury to the back.  DIAGNOSIS Most of the time, the direct cause of low back pain is not known.However, back pain can be treated effectively even when the exact cause of the pain is unknown.Answering your caregiver's questions about your overall health and symptoms is one of the most accurate ways to make sure the cause of your pain is not dangerous. If your caregiver needs more information, he or she may order lab work or imaging tests (X-rays or MRIs).However, even if imaging tests show changes in your back, this usually does not require surgery. HOME CARE INSTRUCTIONS For many people, back pain returns.Since low back pain is rarely dangerous, it is often a condition that people can learn to [facility] their own.   Remain active. It is stressful on the back to sit or stand in one  place. Do not sit, drive, or stand in one place for more than 30 minutes at a time. Take short walks on level surfaces as soon as pain allows.Try to increase the length of time you walk each day.   Do not stay in bed.Resting more than 1 or 2 days can delay your recovery.   Do not avoid exercise or work.Your body is made to move.It is not dangerous to be active, even though your back may hurt.Your back will likely heal faster if you return to being active before your pain is gone.   Pay attention to your body when you bend and lift. Many people have less discomfortwhen lifting if they bend their knees, keep the load close to their bodies,and avoid twisting. Often, the most comfortable positions are those that put less stress on your recovering back.   Find a comfortable position to sleep. Use a firm mattress and lie on your side with your knees slightly bent. If you lie on your back, put a pillow under your knees.   Only take  over-the-counter or prescription medicines as directed by your caregiver. Over-the-counter medicines to reduce pain and inflammation are often the most helpful.Your caregiver may prescribe muscle relaxant drugs.These medicines help dull your pain so you can more quickly return to your normal activities and healthy exercise.   Put ice on the injured area.   Put ice in a plastic bag.   Place a towel between your skin and the bag.   Leave the ice on for 15 to 20 minutes, 3 to 4 times a day for the first 2 to 3 days. After that, ice and heat may be alternated to reduce pain and spasms.   Ask your caregiver about trying back exercises and gentle massage. This may be of some benefit.   Avoid feeling anxious or stressed.Stress increases muscle tension and can worsen back pain.It is important to recognize when you are anxious or stressed and learn ways to manage it.Exercise is a great option.  SEEK MEDICAL CARE IF:  You have pain that is not relieved with rest or  medicine.   You have pain that does not improve in 1 week.   You have new symptoms.   You are generally not feeling well.  SEEK IMMEDIATE MEDICAL CARE IF:   You have pain that radiates from your back into your legs.   You develop new bowel or bladder control problems.   You have unusual weakness or numbness in your arms or legs.   You develop nausea or vomiting.   You develop abdominal pain.   You feel faint.  Document Released: 06/14/2005 Document Revised: 02/24/2011 Document Reviewed: 11/02/2010 [facility] Patient Information 2012 [place], [place].

## 2011-07-27 NOTE — Assessment & Plan Note (Signed)
I will check his UA today 

## 2011-07-27 NOTE — Assessment & Plan Note (Signed)
This is healing.

## 2011-07-27 NOTE — Assessment & Plan Note (Signed)
I will recheck his CPK level today

## 2011-07-27 NOTE — Assessment & Plan Note (Signed)
He is seeing Dr. Dub Mikes

## 2011-07-27 NOTE — Assessment & Plan Note (Signed)
This is improving, his ABI's were normal, he will stop the antibiotics due to diarrhea

## 2011-07-27 NOTE — Assessment & Plan Note (Signed)
He has no s/s of blood loss, I will recheck his CBC today 

## 2011-07-27 NOTE — Assessment & Plan Note (Signed)
His BP is well controlled 

## 2011-07-27 NOTE — Assessment & Plan Note (Signed)
I will look at an xray of his low back for occult fracture, I think this pain is a MS strain, he can't take nsaids and needs to avoid narcotics as well

## 2011-07-30 ENCOUNTER — Other Ambulatory Visit: Payer: Self-pay | Admitting: Internal Medicine

## 2011-07-30 ENCOUNTER — Telehealth: Payer: Self-pay | Admitting: *Deleted

## 2011-07-30 NOTE — Telephone Encounter (Signed)
Pt left message stating that he was returing Dr. Yetta Barre' phone call-pt state that he had lab work and Xrays done on 1/28 and wanted to know if Dr. Yetta Barre was calling regarding results.

## 2011-08-02 ENCOUNTER — Telehealth: Payer: Self-pay

## 2011-08-02 NOTE — Telephone Encounter (Signed)
Received refill request from pharmacy asking if ok to refill clonazepam 2 mg BID a few days early. Please advise Thanks

## 2011-08-02 NOTE — Telephone Encounter (Signed)
Pharmacy notified.

## 2011-08-02 NOTE — Telephone Encounter (Signed)
NO

## 2011-08-03 ENCOUNTER — Encounter: Payer: Self-pay | Admitting: Cardiovascular Disease

## 2011-08-03 ENCOUNTER — Ambulatory Visit (INDEPENDENT_AMBULATORY_CARE_PROVIDER_SITE_OTHER): Payer: 59 | Admitting: Cardiovascular Disease

## 2011-08-03 VITALS — BP 141/89 | HR 57 | Ht 66.0 in | Wt 158.0 lb

## 2011-08-03 DIAGNOSIS — I10 Essential (primary) hypertension: Secondary | ICD-10-CM

## 2011-08-03 DIAGNOSIS — I2510 Atherosclerotic heart disease of native coronary artery without angina pectoris: Secondary | ICD-10-CM

## 2011-08-03 DIAGNOSIS — M6282 Rhabdomyolysis: Secondary | ICD-10-CM

## 2011-08-03 NOTE — Assessment & Plan Note (Signed)
Blood pressure is controlled. The patient will continue on metoprolol.

## 2011-08-03 NOTE — Assessment & Plan Note (Addendum)
The patient is having chest pain. His symptoms are difficult to sort out by history. He has had a myocardial infarction and was treated with overlapping drug-eluting stents. He also has moderate residual disease in the LAD. I think we should perform a stress Myoview scan to rule out ischemia. He will continue on dual antiplatelet therapy with aspirin and Plavix and I would favor long-term therapy with these agents. The other major issue for [name] is whether he can continue with work. He has multiple problems and has been hospitalized several times over the past year. I think considering his underlying heart disease, difficult to control bipolar disorder, recent rhabdomyolysis, chronic back pain, and other problems that it will be difficult for him to remain employee. I asked him to discuss disability with his primary physician, Dr. Yetta Barre. I would be happy to write him a letter of support from a cardiac perspective.

## 2011-08-03 NOTE — Assessment & Plan Note (Signed)
The patient had clear evidence of rhabdomyolysis and I think future treatment with a statin drug will be contraindicated. We will have to do the best we can with diet and lifestyle modification.
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
## 2011-08-03 NOTE — Patient Instructions (Signed)
Your physician wants you to follow-up in: 6 MONTHS.  You will receive a reminder letter in the mail two months in advance. If you don't receive a letter, please call our office to schedule the follow-up appointment.  Your physician has requested that you have an exercise stress myoview. For further information please visit [email or web address]. Please follow instruction sheet, as given.  Your physician recommends that you continue on your current medications as directed. Please refer to the Current Medication list given to you today.   

## 2011-08-05 ENCOUNTER — Other Ambulatory Visit: Payer: Self-pay | Admitting: Internal Medicine

## 2011-08-06 ENCOUNTER — Ambulatory Visit: Payer: 59 | Admitting: Internal Medicine

## 2011-08-11 ENCOUNTER — Ambulatory Visit (HOSPITAL_COMMUNITY): Payer: 59 | Admitting: Radiology

## 2011-08-26 ENCOUNTER — Other Ambulatory Visit: Payer: Self-pay

## 2011-08-26 DIAGNOSIS — I252 Old myocardial infarction: Secondary | ICD-10-CM

## 2011-08-26 DIAGNOSIS — E7800 Pure hypercholesterolemia, unspecified: Secondary | ICD-10-CM

## 2011-08-26 MED ORDER — CLOPIDOGREL BISULFATE 75 MG PO TABS: 75.0000 mg | ORAL_TABLET | Freq: Every day | ORAL | Status: DC

## 2011-08-26 MED ORDER — NITROGLYCERIN 0.4 MG SL SUBL: SUBLINGUAL_TABLET | SUBLINGUAL | Status: DC

## 2011-08-26 MED ORDER — DIVALPROEX SODIUM ER 500 MG PO TB24: 2000.0000 mg | ORAL_TABLET | Freq: Every day | ORAL | Status: DC

## 2011-08-26 MED ORDER — METOPROLOL TARTRATE 25 MG PO TABS: 25.0000 mg | ORAL_TABLET | Freq: Two times a day (BID) | ORAL | Status: DC

## 2011-08-26 MED ORDER — LAMOTRIGINE 25 MG PO TABS: ORAL_TABLET | ORAL | Status: DC

## 2011-08-26 MED ORDER — TRAZODONE HCL 100 MG PO TABS: 100.0000 mg | ORAL_TABLET | Freq: Every day | ORAL | Status: DC

## 2011-09-15 ENCOUNTER — Telehealth: Payer: Self-pay | Admitting: Cardiovascular Disease

## 2011-09-15 NOTE — Telephone Encounter (Signed)
Patient called, stating he had chest pain this morning took 2 NTG with relief.States he has a little sob and nausea now.States he has noticed more chest pain since he lost his job in 2/13.States would like appointment to see Dr.Cooper.Fowarded to [employer] nurse.

## 2011-09-15 NOTE — Telephone Encounter (Signed)
New msg Pt had chest pain today and took nitro sent to triage

## 2011-09-15 NOTE — Telephone Encounter (Signed)
The pt continues to remain nauseated and having CP. The pt is using NTG for relief of symptoms.  I advised the pt that he needs to call EMS and be transported to the hospital. The pt said he has been laid off from his job and is scheduled to see his  PCP Dr Edward Qualia tomorrow morning. I made the pt aware with his history and current symptoms he should go to the hospital.  The pt said he might go to ER or he will wait and be seen by PCP. I once again advised the pt to call EMS and go to the hospital for evaluation.

## 2011-09-16 ENCOUNTER — Ambulatory Visit: Payer: 59 | Admitting: Internal Medicine

## 2011-09-16 DIAGNOSIS — Z0289 Encounter for other administrative examinations: Secondary | ICD-10-CM

## 2011-10-27 ENCOUNTER — Ambulatory Visit: Payer: Self-pay | Admitting: Orthopedic Surgery

## 2011-10-27 LAB — CBC WITH DIFFERENTIAL/PLATELET
Basophil #: 0.1 10*3/uL (ref 0.0–0.1)
Basophil %: 1.3 %
Eosinophil %: 2.8 %
HGB: 15.4 g/dL (ref 13.0–18.0)
Lymphocyte %: 40.7 %
MCH: 36.3 pg — ABNORMAL HIGH (ref 26.0–34.0)
MCHC: 35.5 g/dL (ref 32.0–36.0)
MCV: 102 fL — ABNORMAL HIGH (ref 80–100)
Monocyte %: 8.2 %
Neutrophil %: 47 %
Platelet: 237 10*3/uL (ref 150–440)
RBC: 4.25 10*6/uL — ABNORMAL LOW (ref 4.40–5.90)
RDW: 12.9 % (ref 11.5–14.5)

## 2011-10-27 LAB — BASIC METABOLIC PANEL
Chloride: 110 mmol/L — ABNORMAL HIGH (ref 98–107)
Creatinine: 0.88 mg/dL (ref 0.60–1.30)
Glucose: 88 mg/dL (ref 65–99)
Osmolality: 282 (ref 275–301)
Potassium: 3.8 mmol/L (ref 3.5–5.1)

## 2011-11-02 ENCOUNTER — Ambulatory Visit: Payer: Self-pay | Admitting: Orthopedic Surgery

## 2011-12-13 ENCOUNTER — Other Ambulatory Visit: Payer: Self-pay | Admitting: Cardiovascular Disease

## 2011-12-27 ENCOUNTER — Telehealth: Payer: Self-pay | Admitting: Cardiovascular Disease

## 2011-12-27 NOTE — Telephone Encounter (Signed)
New Problem:    Patient canceled his appointment on 02/08/12 because he recently lost his job and wanted to inquire about cost and financial information before he rescheduled.  Please call back.

## 2011-12-29 NOTE — Telephone Encounter (Signed)
I spoke with the pt and made him aware that he will need to contact billing about the cost of an EKG, OV and lab work.  I gave the pt the number for billing.

## 2012-02-08 ENCOUNTER — Ambulatory Visit: Payer: 59 | Admitting: Cardiovascular Disease

## 2012-02-17 ENCOUNTER — Other Ambulatory Visit: Payer: Self-pay | Admitting: Internal Medicine

## 2012-06-14 ENCOUNTER — Other Ambulatory Visit: Payer: Self-pay | Admitting: Orthopedic Surgery

## 2012-06-15 ENCOUNTER — Encounter (HOSPITAL_BASED_OUTPATIENT_CLINIC_OR_DEPARTMENT_OTHER)
Admission: RE | Admit: 2012-06-15 | Discharge: 2012-06-15 | Disposition: A | Discharge status: Home / Self Care | Payer: Self-pay | Source: Ambulatory Visit | Attending: Orthopedic Surgery | Admitting: Orthopedic Surgery

## 2012-06-15 ENCOUNTER — Encounter (HOSPITAL_BASED_OUTPATIENT_CLINIC_OR_DEPARTMENT_OTHER): Payer: Self-pay | Admitting: *Deleted

## 2012-06-15 LAB — BASIC METABOLIC PANEL
CO2: 26 mEq/L (ref 19–32)
Calcium: 9.8 mg/dL (ref 8.4–10.5)
Creatinine, Ser: 0.84 mg/dL (ref 0.50–1.35)
Glucose, Bld: 86 mg/dL (ref 70–99)

## 2012-06-15 NOTE — Progress Notes (Signed)
Pt sees dr Collins [place] ans dr Gwynneth Macleod cardiology-he has this shoulder done 5/13 at [facility] for records-has had no more cardiac issues sine 12/12 No working  Sister to come with him He will come for bmet-will bring all meds dos and pack overnight bag in case he needs to stay

## 2012-06-19 ENCOUNTER — Encounter (HOSPITAL_BASED_OUTPATIENT_CLINIC_OR_DEPARTMENT_OTHER)
Admission: RE | Disposition: A | Discharge status: Home / Self Care | Payer: Self-pay | Source: Ambulatory Visit | Attending: Orthopedic Surgery

## 2012-06-19 ENCOUNTER — Encounter (HOSPITAL_BASED_OUTPATIENT_CLINIC_OR_DEPARTMENT_OTHER): Payer: Self-pay | Admitting: Anesthesiology

## 2012-06-19 ENCOUNTER — Ambulatory Visit (HOSPITAL_BASED_OUTPATIENT_CLINIC_OR_DEPARTMENT_OTHER): Payer: Worker's Compensation | Admitting: Anesthesiology

## 2012-06-19 ENCOUNTER — Ambulatory Visit (HOSPITAL_BASED_OUTPATIENT_CLINIC_OR_DEPARTMENT_OTHER)
Admission: RE | Admit: 2012-06-19 | Discharge: 2012-06-19 | Disposition: A | Discharge status: Home / Self Care | Payer: Worker's Compensation | Source: Ambulatory Visit | Attending: Orthopedic Surgery | Admitting: Orthopedic Surgery

## 2012-06-19 ENCOUNTER — Encounter (HOSPITAL_BASED_OUTPATIENT_CLINIC_OR_DEPARTMENT_OTHER): Payer: Self-pay | Admitting: *Deleted

## 2012-06-19 DIAGNOSIS — M67919 Unspecified disorder of synovium and tendon, unspecified shoulder: Secondary | ICD-10-CM | POA: Insufficient documentation

## 2012-06-19 DIAGNOSIS — Z8051 Family history of malignant neoplasm of kidney: Secondary | ICD-10-CM | POA: Insufficient documentation

## 2012-06-19 DIAGNOSIS — I252 Old myocardial infarction: Secondary | ICD-10-CM | POA: Insufficient documentation

## 2012-06-19 DIAGNOSIS — Z9861 Coronary angioplasty status: Secondary | ICD-10-CM | POA: Insufficient documentation

## 2012-06-19 DIAGNOSIS — Z8249 Family history of ischemic heart disease and other diseases of the circulatory system: Secondary | ICD-10-CM | POA: Insufficient documentation

## 2012-06-19 DIAGNOSIS — M795 Residual foreign body in soft tissue: Secondary | ICD-10-CM | POA: Insufficient documentation

## 2012-06-19 DIAGNOSIS — I4891 Unspecified atrial fibrillation: Secondary | ICD-10-CM | POA: Insufficient documentation

## 2012-06-19 DIAGNOSIS — I129 Hypertensive chronic kidney disease with stage 1 through stage 4 chronic kidney disease, or unspecified chronic kidney disease: Secondary | ICD-10-CM | POA: Insufficient documentation

## 2012-06-19 DIAGNOSIS — K219 Gastro-esophageal reflux disease without esophagitis: Secondary | ICD-10-CM | POA: Insufficient documentation

## 2012-06-19 DIAGNOSIS — Z807 Family history of other malignant neoplasms of lymphoid, hematopoietic and related tissues: Secondary | ICD-10-CM | POA: Insufficient documentation

## 2012-06-19 DIAGNOSIS — Z800 Family history of malignant neoplasm of digestive organs: Secondary | ICD-10-CM | POA: Insufficient documentation

## 2012-06-19 DIAGNOSIS — Z818 Family history of other mental and behavioral disorders: Secondary | ICD-10-CM | POA: Insufficient documentation

## 2012-06-19 DIAGNOSIS — N189 Chronic kidney disease, unspecified: Secondary | ICD-10-CM | POA: Insufficient documentation

## 2012-06-19 DIAGNOSIS — I2510 Atherosclerotic heart disease of native coronary artery without angina pectoris: Secondary | ICD-10-CM | POA: Insufficient documentation

## 2012-06-19 DIAGNOSIS — M75100 Unspecified rotator cuff tear or rupture of unspecified shoulder, not specified as traumatic: Secondary | ICD-10-CM

## 2012-06-19 DIAGNOSIS — Z87442 Personal history of urinary calculi: Secondary | ICD-10-CM | POA: Insufficient documentation

## 2012-06-19 DIAGNOSIS — F17200 Nicotine dependence, unspecified, uncomplicated: Secondary | ICD-10-CM | POA: Insufficient documentation

## 2012-06-19 DIAGNOSIS — M719 Bursopathy, unspecified: Secondary | ICD-10-CM | POA: Insufficient documentation

## 2012-06-19 DIAGNOSIS — E785 Hyperlipidemia, unspecified: Secondary | ICD-10-CM | POA: Insufficient documentation

## 2012-06-19 DIAGNOSIS — Z8261 Family history of arthritis: Secondary | ICD-10-CM | POA: Insufficient documentation

## 2012-06-19 DIAGNOSIS — M9420 Chondromalacia, unspecified site: Secondary | ICD-10-CM | POA: Insufficient documentation

## 2012-06-19 HISTORY — PX: SHOULDER ARTHROSCOPY WITH ROTATOR CUFF REPAIR: SHX5685

## 2012-06-19 SURGERY — ARTHROSCOPY, SHOULDER, WITH ROTATOR CUFF REPAIR
Anesthesia: Regional | Site: Shoulder | Laterality: Right | Wound class: Clean

## 2012-06-19 MED ORDER — HYDROMORPHONE HCL PF 1 MG/ML IJ SOLN: 0.2500 mg | INTRAMUSCULAR | Status: DC | PRN

## 2012-06-19 MED ORDER — OXYCODONE-ACETAMINOPHEN 5-325 MG PO TABS: 1.0000 | ORAL_TABLET | ORAL | Status: DC | PRN

## 2012-06-19 MED ORDER — OXYCODONE HCL 5 MG PO TABS: 5.0000 mg | ORAL_TABLET | Freq: Once | ORAL | Status: DC | PRN

## 2012-06-19 MED ORDER — METOPROLOL TARTRATE 25 MG PO TABS
25.0000 mg | ORAL_TABLET | Freq: Once | ORAL | Status: AC
  Administered 2012-06-19: 25 mg via ORAL

## 2012-06-19 MED ORDER — POVIDONE-IODINE 7.5 % EX SOLN
Freq: Once | CUTANEOUS | Status: AC
Start: 2012-06-19 — End: 2012-06-19
  Administered 2012-06-19: 1 via TOPICAL

## 2012-06-19 MED ORDER — FENTANYL CITRATE 0.05 MG/ML IJ SOLN
50.0000 ug | INTRAMUSCULAR | Status: DC | PRN
  Administered 2012-06-19: 100 ug via INTRAVENOUS

## 2012-06-19 MED ORDER — BUPIVACAINE-EPINEPHRINE PF 0.5-1:200000 % IJ SOLN
INTRAMUSCULAR | Status: DC | PRN
  Administered 2012-06-19: 30 mL

## 2012-06-19 MED ORDER — LIDOCAINE HCL (CARDIAC) 20 MG/ML IV SOLN
INTRAVENOUS | Status: DC | PRN
  Administered 2012-06-19: 50 mg via INTRAVENOUS

## 2012-06-19 MED ORDER — EPHEDRINE SULFATE 50 MG/ML IJ SOLN
INTRAMUSCULAR | Status: DC | PRN
  Administered 2012-06-19 (×2): 10 mg via INTRAVENOUS

## 2012-06-19 MED ORDER — CEFAZOLIN SODIUM-DEXTROSE 2-3 GM-% IV SOLR
2.0000 g | INTRAVENOUS | Status: AC
Start: 2012-06-19 — End: 2012-06-19
  Administered 2012-06-19: 2 g via INTRAVENOUS

## 2012-06-19 MED ORDER — LACTATED RINGERS IV SOLN
INTRAVENOUS | Status: DC
  Administered 2012-06-19 (×2): via INTRAVENOUS

## 2012-06-19 MED ORDER — MIDAZOLAM HCL 2 MG/2ML IJ SOLN
0.5000 mg | INTRAMUSCULAR | Status: DC | PRN
  Administered 2012-06-19: 2 mg via INTRAVENOUS

## 2012-06-19 MED ORDER — ONDANSETRON HCL 4 MG/2ML IJ SOLN
INTRAMUSCULAR | Status: DC | PRN
  Administered 2012-06-19: 4 mg via INTRAVENOUS

## 2012-06-19 MED ORDER — HYDRALAZINE HCL 20 MG/ML IJ SOLN
5.0000 mg | Freq: Four times a day (QID) | INTRAMUSCULAR | Status: DC | PRN
  Administered 2012-06-19 (×2): 5 mg via INTRAVENOUS

## 2012-06-19 MED ORDER — FENTANYL CITRATE 0.05 MG/ML IJ SOLN
INTRAMUSCULAR | Status: DC | PRN
  Administered 2012-06-19 (×2): 50 ug via INTRAVENOUS

## 2012-06-19 MED ORDER — SUCCINYLCHOLINE CHLORIDE 20 MG/ML IJ SOLN
INTRAMUSCULAR | Status: DC | PRN
  Administered 2012-06-19: 50 mg via INTRAVENOUS

## 2012-06-19 MED ORDER — PROPOFOL 10 MG/ML IV BOLUS
INTRAVENOUS | Status: DC | PRN
  Administered 2012-06-19: 200 mg via INTRAVENOUS

## 2012-06-19 MED ORDER — OXYCODONE HCL 5 MG/5ML PO SOLN: 5.0000 mg | Freq: Once | ORAL | Status: DC | PRN

## 2012-06-19 MED ORDER — DEXAMETHASONE SODIUM PHOSPHATE 4 MG/ML IJ SOLN
INTRAMUSCULAR | Status: DC | PRN
  Administered 2012-06-19: 10 mg via INTRAVENOUS

## 2012-06-19 SURGICAL SUPPLY — 75 items
BENZOIN TINCTURE PRP APPL 2/3 (GAUZE/BANDAGES/DRESSINGS) IMPLANT
BLADE SURG 15 STRL LF DISP TIS (BLADE) IMPLANT
BLADE SURG 15 STRL SS (BLADE)
BLADE SURG ROTATE 9660 (MISCELLANEOUS) IMPLANT
BUR OVAL 4.0 (BURR) ×2 IMPLANT
CANISTER OMNI JUG 16 LITER (MISCELLANEOUS) ×2 IMPLANT
CANISTER SUCTION 2500CC (MISCELLANEOUS) IMPLANT
CANNULA 5.75X71 LONG (CANNULA) ×2 IMPLANT
CANNULA TWIST IN 8.25X7CM (CANNULA) IMPLANT
CHLORAPREP W/TINT 26ML (MISCELLANEOUS) ×2 IMPLANT
CLOTH BEACON ORANGE TIMEOUT ST (SAFETY) ×2 IMPLANT
DECANTER SPIKE VIAL GLASS SM (MISCELLANEOUS) IMPLANT
DERMABOND ADVANCED (GAUZE/BANDAGES/DRESSINGS)
DERMABOND ADVANCED .7 DNX12 (GAUZE/BANDAGES/DRESSINGS) IMPLANT
DRAPE INCISE IOBAN 66X45 STRL (DRAPES) ×2 IMPLANT
DRAPE STERI 35X30 U-POUCH (DRAPES) ×2 IMPLANT
DRAPE SURG 17X23 STRL (DRAPES) ×2 IMPLANT
DRAPE U 20/CS (DRAPES) ×2 IMPLANT
DRAPE U-SHAPE 47X51 STRL (DRAPES) ×2 IMPLANT
DRAPE U-SHAPE 76X120 STRL (DRAPES) ×4 IMPLANT
DRSG PAD ABDOMINAL 8X10 ST (GAUZE/BANDAGES/DRESSINGS) ×2 IMPLANT
ELECT REM PT RETURN 9FT ADLT (ELECTROSURGICAL) ×2
ELECTRODE REM PT RTRN 9FT ADLT (ELECTROSURGICAL) ×1 IMPLANT
GAUZE SPONGE 4X4 16PLY XRAY LF (GAUZE/BANDAGES/DRESSINGS) IMPLANT
GAUZE XEROFORM 1X8 LF (GAUZE/BANDAGES/DRESSINGS) ×2 IMPLANT
GLOVE BIO SURGEON STRL SZ7 (GLOVE) IMPLANT
GLOVE BIO SURGEON STRL SZ7.5 (GLOVE) ×2 IMPLANT
GLOVE BIOGEL M 7.0 STRL (GLOVE) ×2 IMPLANT
GLOVE BIOGEL PI IND STRL 7.0 (GLOVE) IMPLANT
GLOVE BIOGEL PI IND STRL 7.5 (GLOVE) ×1 IMPLANT
GLOVE BIOGEL PI IND STRL 8 (GLOVE) ×1 IMPLANT
GLOVE BIOGEL PI INDICATOR 7.0 (GLOVE)
GLOVE BIOGEL PI INDICATOR 7.5 (GLOVE) ×1
GLOVE BIOGEL PI INDICATOR 8 (GLOVE) ×1
GOWN PREVENTION PLUS XLARGE (GOWN DISPOSABLE) ×2 IMPLANT
NDL SUT 6 .5 CRC .975X.05 MAYO (NEEDLE) IMPLANT
NEEDLE 1/2 CIR CATGUT .05X1.09 (NEEDLE) IMPLANT
NEEDLE MAYO TAPER (NEEDLE)
NEEDLE SCORPION MULTI FIRE (NEEDLE) IMPLANT
NS IRRIG 1000ML POUR BTL (IV SOLUTION) IMPLANT
PACK ARTHROSCOPY DSU (CUSTOM PROCEDURE TRAY) ×2 IMPLANT
PACK BASIN DAY SURGERY FS (CUSTOM PROCEDURE TRAY) ×2 IMPLANT
PENCIL BUTTON HOLSTER BLD 10FT (ELECTRODE) IMPLANT
RESECTOR FULL RADIUS 4.2MM (BLADE) ×2 IMPLANT
SLEEVE SCD COMPRESS KNEE MED (MISCELLANEOUS) ×2 IMPLANT
SLING ARM FOAM STRAP LRG (SOFTGOODS) ×2 IMPLANT
SLING ARM FOAM STRAP MED (SOFTGOODS) IMPLANT
SLING ARM FOAM STRAP XLG (SOFTGOODS) IMPLANT
SLING ARM IMMOBILIZER MED (SOFTGOODS) IMPLANT
SPONGE GAUZE 4X4 12PLY (GAUZE/BANDAGES/DRESSINGS) ×2 IMPLANT
SPONGE LAP 4X18 X RAY DECT (DISPOSABLE) IMPLANT
STRIP CLOSURE SKIN 1/2X4 (GAUZE/BANDAGES/DRESSINGS) IMPLANT
SUCTION FRAZIER TIP 10 FR DISP (SUCTIONS) IMPLANT
SUPPORT WRAP ARM LG (MISCELLANEOUS) IMPLANT
SUT 2 FIBERLOOP 20 STRT BLUE (SUTURE)
SUT BONE WAX W31G (SUTURE) IMPLANT
SUT ETHILON 3 0 PS 1 (SUTURE) ×2 IMPLANT
SUT FIBERWIRE #2 38 T-5 BLUE (SUTURE)
SUT MNCRL AB 3-0 PS2 18 (SUTURE) IMPLANT
SUT MNCRL AB 4-0 PS2 18 (SUTURE) IMPLANT
SUT PDS AB 0 CT 36 (SUTURE) IMPLANT
SUT PROLENE 3 0 PS 2 (SUTURE) IMPLANT
SUT VIC AB 0 CT1 18XCR BRD 8 (SUTURE) IMPLANT
SUT VIC AB 0 CT1 8-18 (SUTURE)
SUT VIC AB 2-0 SH 18 (SUTURE) IMPLANT
SUTURE 2 FIBERLOOP 20 STRT BLU (SUTURE) IMPLANT
SUTURE FIBERWR #2 38 T-5 BLUE (SUTURE) IMPLANT
SYR BULB 3OZ (MISCELLANEOUS) IMPLANT
TOWEL OR 17X24 6PK STRL BLUE (TOWEL DISPOSABLE) ×2 IMPLANT
TOWEL OR NON WOVEN STRL DISP B (DISPOSABLE) ×2 IMPLANT
TUBE CONNECTING 20X1/4 (TUBING) ×2 IMPLANT
TUBING ARTHROSCOPY IRRIG 16FT (MISCELLANEOUS) ×4 IMPLANT
WAND STAR VAC 90 (SURGICAL WAND) ×4 IMPLANT
WATER STERILE IRR 1000ML POUR (IV SOLUTION) ×2 IMPLANT
YANKAUER SUCT BULB TIP NO VENT (SUCTIONS) IMPLANT

## 2012-06-19 NOTE — Op Note (Signed)
Procedure(s): SHOULDER ARTHROSCOPY WITH ROTATOR CUFF REPAIR Procedure Note  [name] male 51 y.o. 06/19/2012  Procedure(s) and Anesthesia Type:    #1 right shoulder arthroscopic extensive debridement of irreparable rotator cuff tear    #2 right shoulder arthroscopic revision subacromial decompression and tuberoplasty    #3 right shoulder arthroscopic removal of foreign material totaling 5 cm  Surgeon(s) and Role:    * Mable Paris, MD - Primary     Surgeon: Mable Paris   Assistants: None  Anesthesia: General endotracheal anesthesia with preoperative interscalene block    Procedure Detail  Estimated Blood Loss: Min         Drains: none  Blood Given: none         Specimens: none        Complications:  * No complications entered in OR log *         Disposition: PACU - hemodynamically stable.         Condition: stable    Procedure:   INDICATIONS FOR SURGERY: The patient is 51 y.o. male who has had a long history of right shoulder problems. Many years ago he had a stabilization procedure and recently had a repeat injury at work which lead to a rotator cuff tear. He had an attempted open rotator cuff repair back in May of this year. He went on to have continued pain and problems in the shoulder. He was seen by me for a second opinion. He was sent for possible arthroplasty. I felt given his relatively young age and lack of end-stage arthritis on x-ray that it would be worth an attempt at an arthroscopic debridement given his mechanical symptoms. He wished to go forward with that.  OPERATIVE FINDINGS: Examination under anesthesia:  No stiffness or instability Diagnostic Arthroscopy:  Glenoid articular cartilage: The superior half of the glenoid was noted to have exposed bone with grade 4 chondromalacia. The inferior half and intact but thin cartilage. Humeral head articular cartilage: The superior posterior quadrant had grade 4 chondromalacia  with exposed bone. The remainder the head had thin cartilage. Labrum: Degenerated Loose bodies: 2 fragments of suture from prior rotator cuff anchors were removed totaling 5 cm Synovitis: Moderate rotator cuff: The subscapularis was still intact anteriorly but the supraspinatus infraspinatus were completely torn and retracted. There is an area of supraspinatus anteriorly which had been attempted to be repaired but was severely degenerated and re torn. This was extensively debrided. Coracoacromial arch: The undersurface of the acromion was noted to be very irregular and was smoothed with a bur. the tuberosity was debrided and small irregularities were evened out with the bur as well.  DESCRIPTION OF PROCEDURE: The patient was identified in preoperative  holding area where I personally marked the operative site after  verifying site, side, and procedure with the patient. An interscalene block was given by the attending anesthesiologist the holding area.  The patient was taken back to the operating room where general anesthesia was induced without complication and was placed in the beach-chair position with the back  elevated about 60 degrees and all extremities and head and neck carefully padded and  positioned.   The right upper extremity was then prepped and  draped in a standard sterile fashion. The appropriate time-out  procedure was carried out. The patient did receive IV antibiotics  within 30 minutes of incision.   A small posterior portal incision was made and the arthroscope was introduced into the joint. An anterior portal was then  established above the subscapularis using needle localization. Small cannula was placed anteriorly. Diagnostic arthroscopy was then carried out with findings as described above.  The shaver was used through the anterior portal to debride the torn and retracted supraspinatus anteriorly back to healthy appearing tissue. The superior labrum is noted to have some  residual long head biceps stranding which is also debrided back to healthy labrum. The superior aspect of the glenoid was noted to be completely absent of any cartilage with exposed bone as well as the posterior superior aspect of the humeral head. No loose cartilaginous fragments were noted but there was one loose strand of suture and one strand of suture which was still attached to the suture anchor. Both these were removed and discarded. Each measured about 2.5 cm.  Soft tissue was debrided off of the tuberosity exposing small irregularities of the tuberosity which were debrided down to a smooth surface with a bur. The posterior anchor was noted to be recessed and was not felt to be a cause mechanical symptoms in his left intact after suture was removed from it. The undersurface of the acromion was exposed with the ArthroCare was noted to be irregular. The bur was used to smooth the undersurface of the acromion. No significant bone was removed anteriorly. Any residual scarring of the coracoacromial ligament was left intact and was not taken down.  The arthroscopic equipment was removed from the joint and the portals were closed with 3-0 nylon in an interrupted fashion. Sterile dressings were then applied including Xeroform 4 x 4's ABDs and tape. The patient was then allowed to awaken from general anesthesia, placed in a sling, transferred to the stretcher and taken to the recovery room in stable condition.   POSTOPERATIVE PLAN: The patient will be discharged home today and will followup in one week for suture removal and wound check.  He will be allowed to use the arm as tolerated.

## 2012-06-19 NOTE — Anesthesia Procedure Notes (Addendum)
Anesthesia Regional Block:  Interscalene brachial plexus block  Pre-Anesthetic Checklist: ,, timeout performed, Correct Patient, Correct Site, Correct Laterality, Correct Procedure, Correct Position, site marked, Risks and benefits discussed, pre-op evaluation,  At surgeon's request and post-op pain management  Laterality: Right  Prep: Maximum Sterile Barrier Precautions used and chloraprep       Needles:  Injection technique: Single-shot  Needle Type: Echogenic Stimulator Needle     Needle Length: 5cm 5 cm Needle Gauge: 22 and 22 G    Additional Needles:  Procedures: ultrasound guided (picture in chart) and nerve stimulator Interscalene brachial plexus block  Nerve Stimulator or Paresthesia:  Response: Biceps response,   Additional Responses:   Narrative:  Start time: 06/19/2012 12:50 PM End time: 06/19/2012 12:57 PM Injection made incrementally with aspirations every 5 mL. Anesthesiologist: Sampson Goon, MD  Additional Notes: 2% Lidocaine skin wheel.   Interscalene brachial plexus block Procedure Name: Intubation Date/Time: 06/19/2012 1:43 PM Performed by: Zenia Resides D Pre-anesthesia Checklist: Patient identified, Emergency Drugs available, Suction available and Patient being monitored Patient Re-evaluated:Patient Re-evaluated prior to inductionOxygen Delivery Method: Circle System Utilized Preoxygenation: Pre-oxygenation with 100% oxygen Intubation Type: IV induction Ventilation: Mask ventilation without difficulty Laryngoscope Size: Mac and 3 Grade View: Grade I Tube type: Oral Tube size: 8.0 mm Number of attempts: 1 Airway Equipment and Method: stylet and oral airway Placement Confirmation: ETT inserted through vocal cords under direct vision,  positive ETCO2 and breath sounds checked- equal and bilateral Secured at: 23 cm Tube secured with: Tape Dental Injury: Teeth and Oropharynx as per pre-operative assessment
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
## 2012-06-19 NOTE — Transfer of Care (Signed)
Immediate Anesthesia Transfer of Care Note  Patient: [name]  Procedure(s) Performed: Procedure(s) (LRB) with comments: SHOULDER ARTHROSCOPY WITH ROTATOR CUFF REPAIR (Right) - right arthroscopy removal of loose material and debridement acromioplasty tuberoplasty   Patient Location: PACU  Anesthesia Type:General and Regional  Level of Consciousness: awake, alert  and oriented  Airway & Oxygen Therapy: Patient Spontanous Breathing and Patient connected to nasal cannula oxygen  Post-op Assessment: Report given to PACU RN and Post -op Vital signs reviewed and stable  Post vital signs: Reviewed and stable  Complications: No apparent anesthesia complications

## 2012-06-19 NOTE — Progress Notes (Signed)
Assisted Dr. Fitzgerald with right, ultrasound guided, interscalene  block. Side rails up, monitors on throughout procedure. See vital signs in flow sheet. Tolerated Procedure well. 

## 2012-06-19 NOTE — Anesthesia Postprocedure Evaluation (Signed)
  Anesthesia Post-op Note  Patient: [name]  Procedure(s) Performed: Procedure(s) (LRB) with comments: SHOULDER ARTHROSCOPY WITH ROTATOR CUFF REPAIR (Right) - right arthroscopy removal of loose material and debridement acromioplasty tuberoplasty   Patient Location: PACU  Anesthesia Type:GA combined with regional for post-op pain  Level of Consciousness: awake, alert  and oriented  Airway and Oxygen Therapy: Patient Spontanous Breathing  Post-op Pain: none  Post-op Assessment: Post-op Vital signs reviewed, Patient's Cardiovascular Status Stable, Respiratory Function Stable, Patent Airway and No signs of Nausea or vomiting  Post-op Vital Signs: Reviewed and stable  Complications: No apparent anesthesia complications

## 2012-06-19 NOTE — Anesthesia Preprocedure Evaluation (Signed)
Anesthesia Evaluation  Patient identified by MRN, date of birth, ID band Patient awake    Reviewed: Allergy & Precautions, H&P , NPO status , Patient's Chart, lab work & pertinent test results, reviewed documented beta blocker date and time   Airway Mallampati: II TM Distance: >3 FB Neck ROM: Full    Dental No notable dental hx. (+) Teeth Intact and Dental Advisory Given   Pulmonary neg pulmonary ROS,  breath sounds clear to auscultation  Pulmonary exam normal       Cardiovascular hypertension, On Medications and On Home Beta Blockers + CAD, + Past MI and + Cardiac Stents + dysrhythmias Rhythm:Regular Rate:Normal     Neuro/Psych PSYCHIATRIC DISORDERS negative neurological ROS     GI/Hepatic Neg liver ROS, GERD-  Medicated and Controlled,  Endo/Other  negative endocrine ROS  Renal/GU negative Renal ROS  negative genitourinary   Musculoskeletal   Abdominal   Peds  Hematology negative hematology ROS (+)   Anesthesia Other Findings   Reproductive/Obstetrics negative OB ROS                           Anesthesia Physical Anesthesia Plan  ASA: III  Anesthesia Plan: General and Regional   Post-op Pain Management:    Induction: Intravenous  Airway Management Planned: Oral ETT  Additional Equipment:   Intra-op Plan:   Post-operative Plan: Extubation in OR  Informed Consent: I have reviewed the patients History and Physical, chart, labs and discussed the procedure including the risks, benefits and alternatives for the proposed anesthesia with the patient or authorized representative who has indicated his/her understanding and acceptance.   Dental advisory given  Plan Discussed with: CRNA and Surgeon  Anesthesia Plan Comments:         Anesthesia Quick Evaluation

## 2012-06-23 ENCOUNTER — Encounter (HOSPITAL_BASED_OUTPATIENT_CLINIC_OR_DEPARTMENT_OTHER): Payer: Self-pay | Admitting: Orthopedic Surgery

## 2012-07-23 ENCOUNTER — Emergency Department (HOSPITAL_COMMUNITY): Payer: Self-pay

## 2012-07-23 ENCOUNTER — Encounter (HOSPITAL_COMMUNITY): Payer: Self-pay | Admitting: Emergency Medicine

## 2012-07-23 ENCOUNTER — Emergency Department (HOSPITAL_COMMUNITY)
Admission: EM | Admit: 2012-07-23 | Discharge: 2012-07-23 | Disposition: A | Discharge status: Home / Self Care | Payer: Self-pay | Attending: Emergency Medicine | Admitting: Emergency Medicine

## 2012-07-23 DIAGNOSIS — N50819 Testicular pain, unspecified: Secondary | ICD-10-CM

## 2012-07-23 DIAGNOSIS — I129 Hypertensive chronic kidney disease with stage 1 through stage 4 chronic kidney disease, or unspecified chronic kidney disease: Secondary | ICD-10-CM | POA: Insufficient documentation

## 2012-07-23 DIAGNOSIS — I2510 Atherosclerotic heart disease of native coronary artery without angina pectoris: Secondary | ICD-10-CM | POA: Insufficient documentation

## 2012-07-23 DIAGNOSIS — N509 Disorder of male genital organs, unspecified: Secondary | ICD-10-CM | POA: Insufficient documentation

## 2012-07-23 DIAGNOSIS — I252 Old myocardial infarction: Secondary | ICD-10-CM | POA: Insufficient documentation

## 2012-07-23 DIAGNOSIS — Z79899 Other long term (current) drug therapy: Secondary | ICD-10-CM | POA: Insufficient documentation

## 2012-07-23 DIAGNOSIS — K219 Gastro-esophageal reflux disease without esophagitis: Secondary | ICD-10-CM | POA: Insufficient documentation

## 2012-07-23 DIAGNOSIS — N189 Chronic kidney disease, unspecified: Secondary | ICD-10-CM | POA: Insufficient documentation

## 2012-07-23 DIAGNOSIS — R109 Unspecified abdominal pain: Secondary | ICD-10-CM | POA: Insufficient documentation

## 2012-07-23 DIAGNOSIS — F17200 Nicotine dependence, unspecified, uncomplicated: Secondary | ICD-10-CM | POA: Insufficient documentation

## 2012-07-23 DIAGNOSIS — Z8639 Personal history of other endocrine, nutritional and metabolic disease: Secondary | ICD-10-CM | POA: Insufficient documentation

## 2012-07-23 DIAGNOSIS — Z8679 Personal history of other diseases of the circulatory system: Secondary | ICD-10-CM | POA: Insufficient documentation

## 2012-07-23 DIAGNOSIS — Z8739 Personal history of other diseases of the musculoskeletal system and connective tissue: Secondary | ICD-10-CM | POA: Insufficient documentation

## 2012-07-23 DIAGNOSIS — Z87828 Personal history of other (healed) physical injury and trauma: Secondary | ICD-10-CM | POA: Insufficient documentation

## 2012-07-23 DIAGNOSIS — M549 Dorsalgia, unspecified: Secondary | ICD-10-CM | POA: Insufficient documentation

## 2012-07-23 DIAGNOSIS — Z8659 Personal history of other mental and behavioral disorders: Secondary | ICD-10-CM | POA: Insufficient documentation

## 2012-07-23 DIAGNOSIS — Z87442 Personal history of urinary calculi: Secondary | ICD-10-CM | POA: Insufficient documentation

## 2012-07-23 DIAGNOSIS — R1110 Vomiting, unspecified: Secondary | ICD-10-CM | POA: Insufficient documentation

## 2012-07-23 DIAGNOSIS — Z862 Personal history of diseases of the blood and blood-forming organs and certain disorders involving the immune mechanism: Secondary | ICD-10-CM | POA: Insufficient documentation

## 2012-07-23 LAB — CBC WITH DIFFERENTIAL/PLATELET
Basophils Absolute: 0.1 10*3/uL (ref 0.0–0.1)
Basophils Relative: 1 % (ref 0–1)
HCT: 42.7 % (ref 39.0–52.0)
MCHC: 35.1 g/dL (ref 30.0–36.0)
Monocytes Absolute: 0.4 10*3/uL (ref 0.1–1.0)
Neutro Abs: 2.9 10*3/uL (ref 1.7–7.7)
Neutrophils Relative %: 47 % (ref 43–77)
RDW: 12.2 % (ref 11.5–15.5)

## 2012-07-23 LAB — BASIC METABOLIC PANEL
BUN: 6 mg/dL (ref 6–23)
Creatinine, Ser: 0.82 mg/dL (ref 0.50–1.35)
GFR calc Af Amer: >90 mL/min (ref >90)
GFR calc non Af Amer: >90 mL/min (ref >90)

## 2012-07-23 LAB — URINALYSIS, ROUTINE W REFLEX MICROSCOPIC
Bilirubin Urine: NEGATIVE
Nitrite: NEGATIVE
Protein, ur: NEGATIVE mg/dL
Specific Gravity, Urine: 1.01 (ref 1.005–1.030)
Urobilinogen, UA: 0.2 mg/dL (ref 0.0–1.0)

## 2012-07-23 MED ORDER — OXYCODONE-ACETAMINOPHEN 5-325 MG PO TABS
1.0000 | ORAL_TABLET | Freq: Once | ORAL | Status: AC
  Administered 2012-07-23: 1 via ORAL
  Filled 2012-07-23: qty 1

## 2012-07-23 MED ORDER — ONDANSETRON HCL 8 MG PO TABS: 8.0000 mg | ORAL_TABLET | Freq: Three times a day (TID) | ORAL | Status: DC | PRN

## 2012-07-23 MED ORDER — HYDROMORPHONE HCL PF 1 MG/ML IJ SOLN
1.0000 mg | Freq: Once | INTRAMUSCULAR | Status: AC
  Administered 2012-07-23: 1 mg via INTRAVENOUS
  Filled 2012-07-23: qty 1

## 2012-07-23 MED ORDER — OXYCODONE-ACETAMINOPHEN 5-325 MG PO TABS: 1.0000 | ORAL_TABLET | ORAL | Status: DC | PRN

## 2012-07-23 MED ORDER — ONDANSETRON HCL 4 MG/2ML IJ SOLN
4.0000 mg | Freq: Once | INTRAMUSCULAR | Status: AC
  Administered 2012-07-23: 4 mg via INTRAVENOUS
  Filled 2012-07-23: qty 2

## 2012-07-23 MED ORDER — SODIUM CHLORIDE 0.9 % IV SOLN
INTRAVENOUS | Status: DC
  Administered 2012-07-23: 20 mL/h via INTRAVENOUS

## 2012-07-23 NOTE — ED Notes (Signed)
Patient c/o lower back pain, groin pain , and testicle pain with vomiting x1 week and half.
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
## 2012-07-24 LAB — URINE CULTURE: Colony Count: NO GROWTH

## 2012-11-22 IMAGING — CR DG CHEST 2V
2 series · 2 of 2 positions shown · non-contrast
Comparison: 04/02/2011

CLINICAL DATA: Shortness of breath.  Chest pain

CHEST - 2 VIEW

[w chest pa]
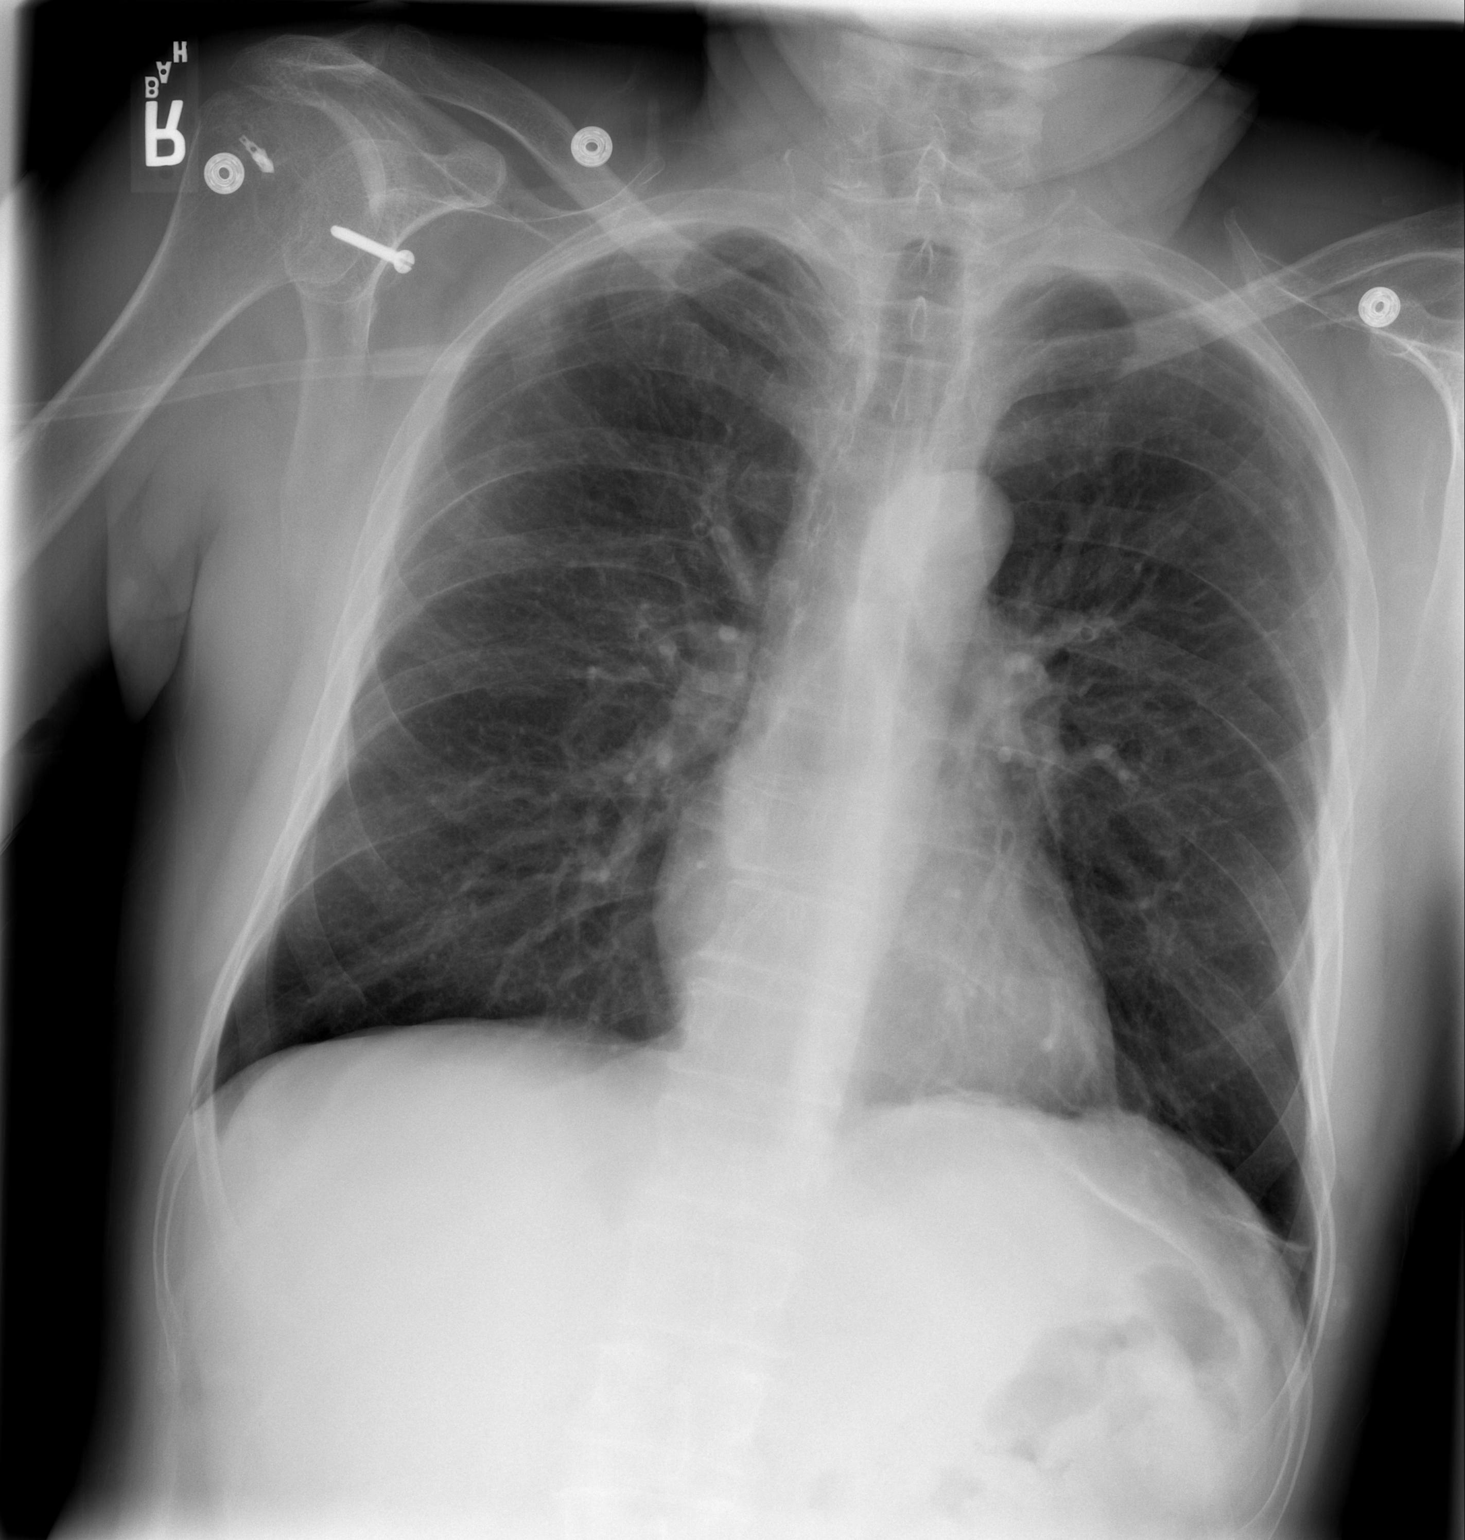

[w chest lat]
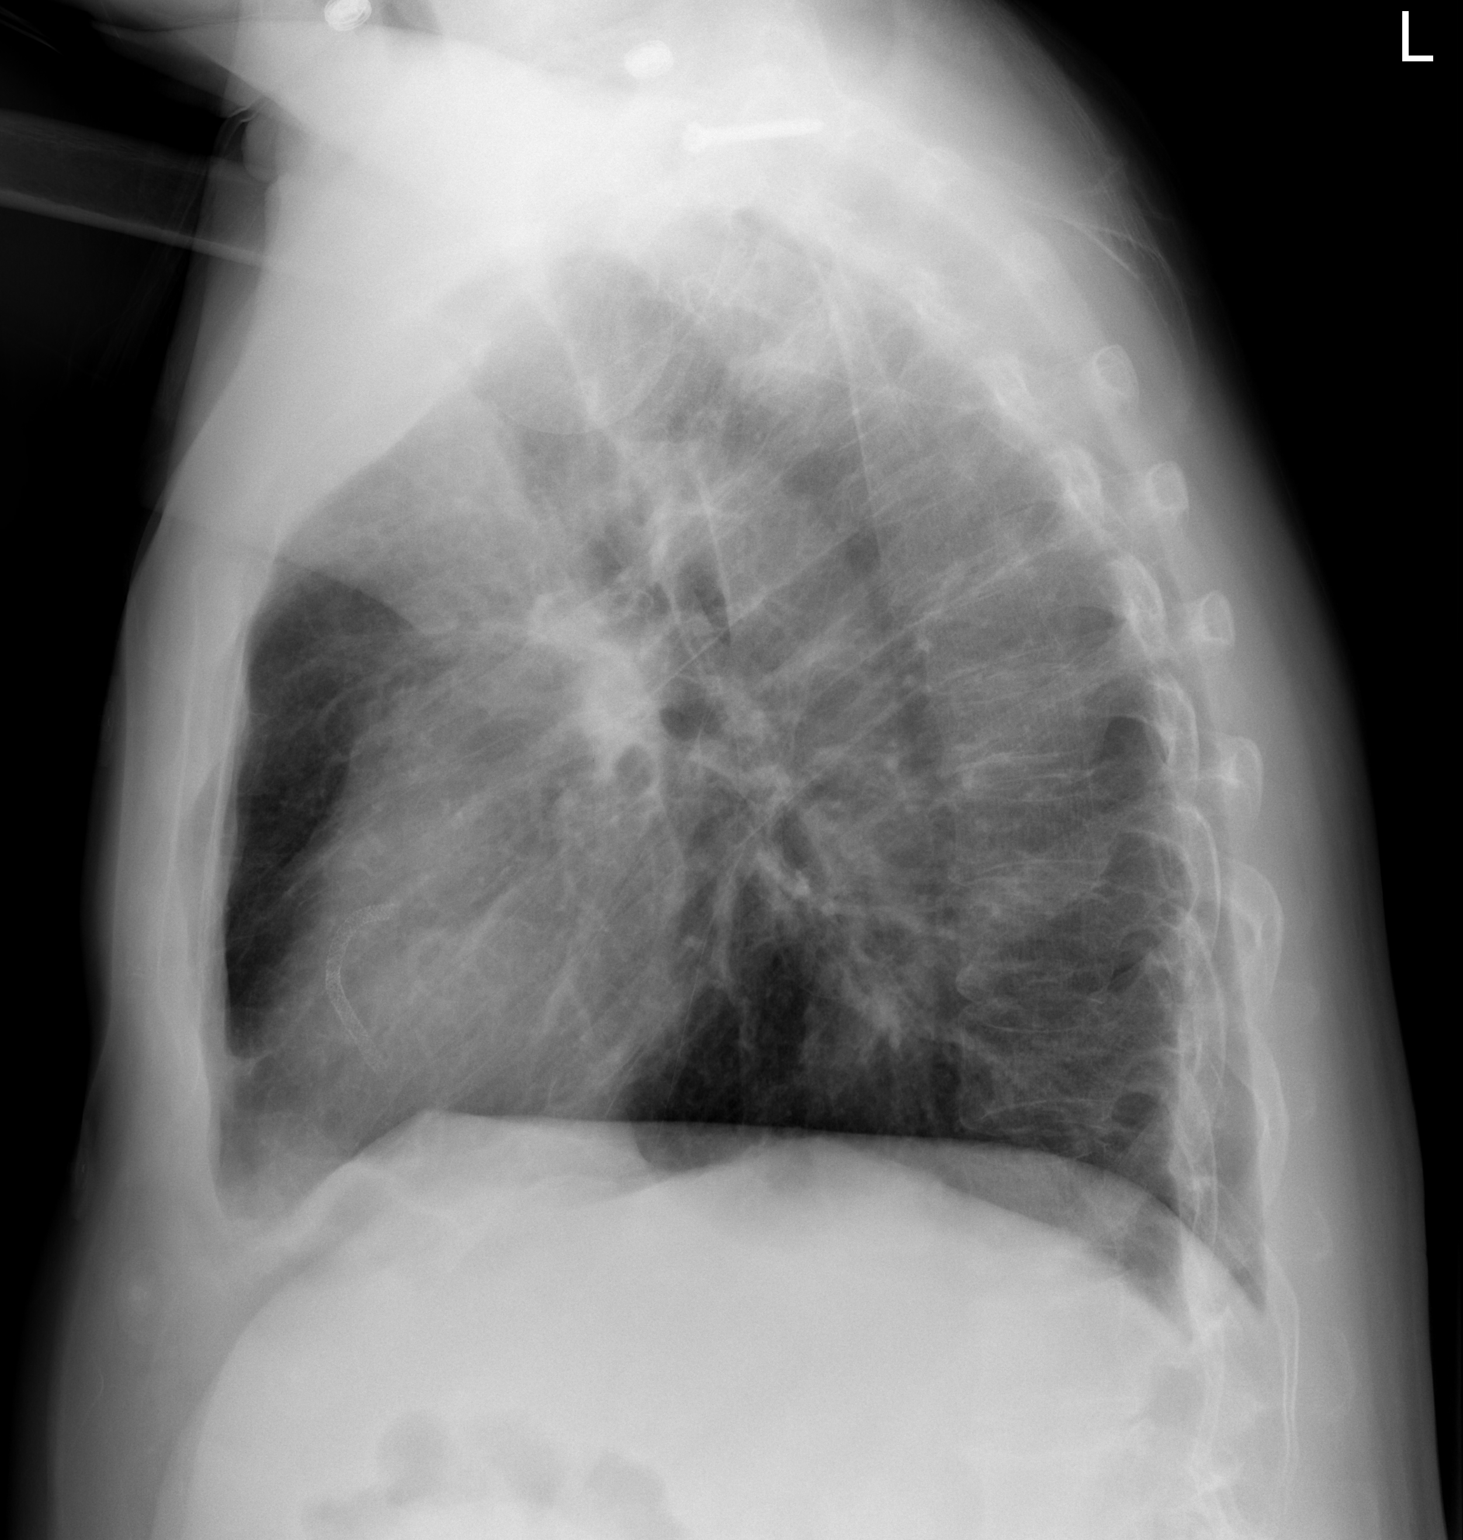

[2 of 2 positions shown; findings below may reference images not displayed]

FINDINGS: Hyperexpansion is consistent with emphysema. The lungs
are clear without focal infiltrate, edema, pneumothorax or pleural
effusion.  Linear scarring is seen at the left lung base. The
cardiopericardial silhouette is within normal limits for size.
IMPRESSION: Stable.  No acute findings.

## 2012-11-23 IMAGING — CR DG CHEST 1V PORT
1 series · 1 of 1 positions shown · non-contrast
Comparison: Chest x-ray of 06/10/2011

CLINICAL DATA: Shortness of breath, difficulty breathing

PORTABLE CHEST - 1 VIEW

[AP]
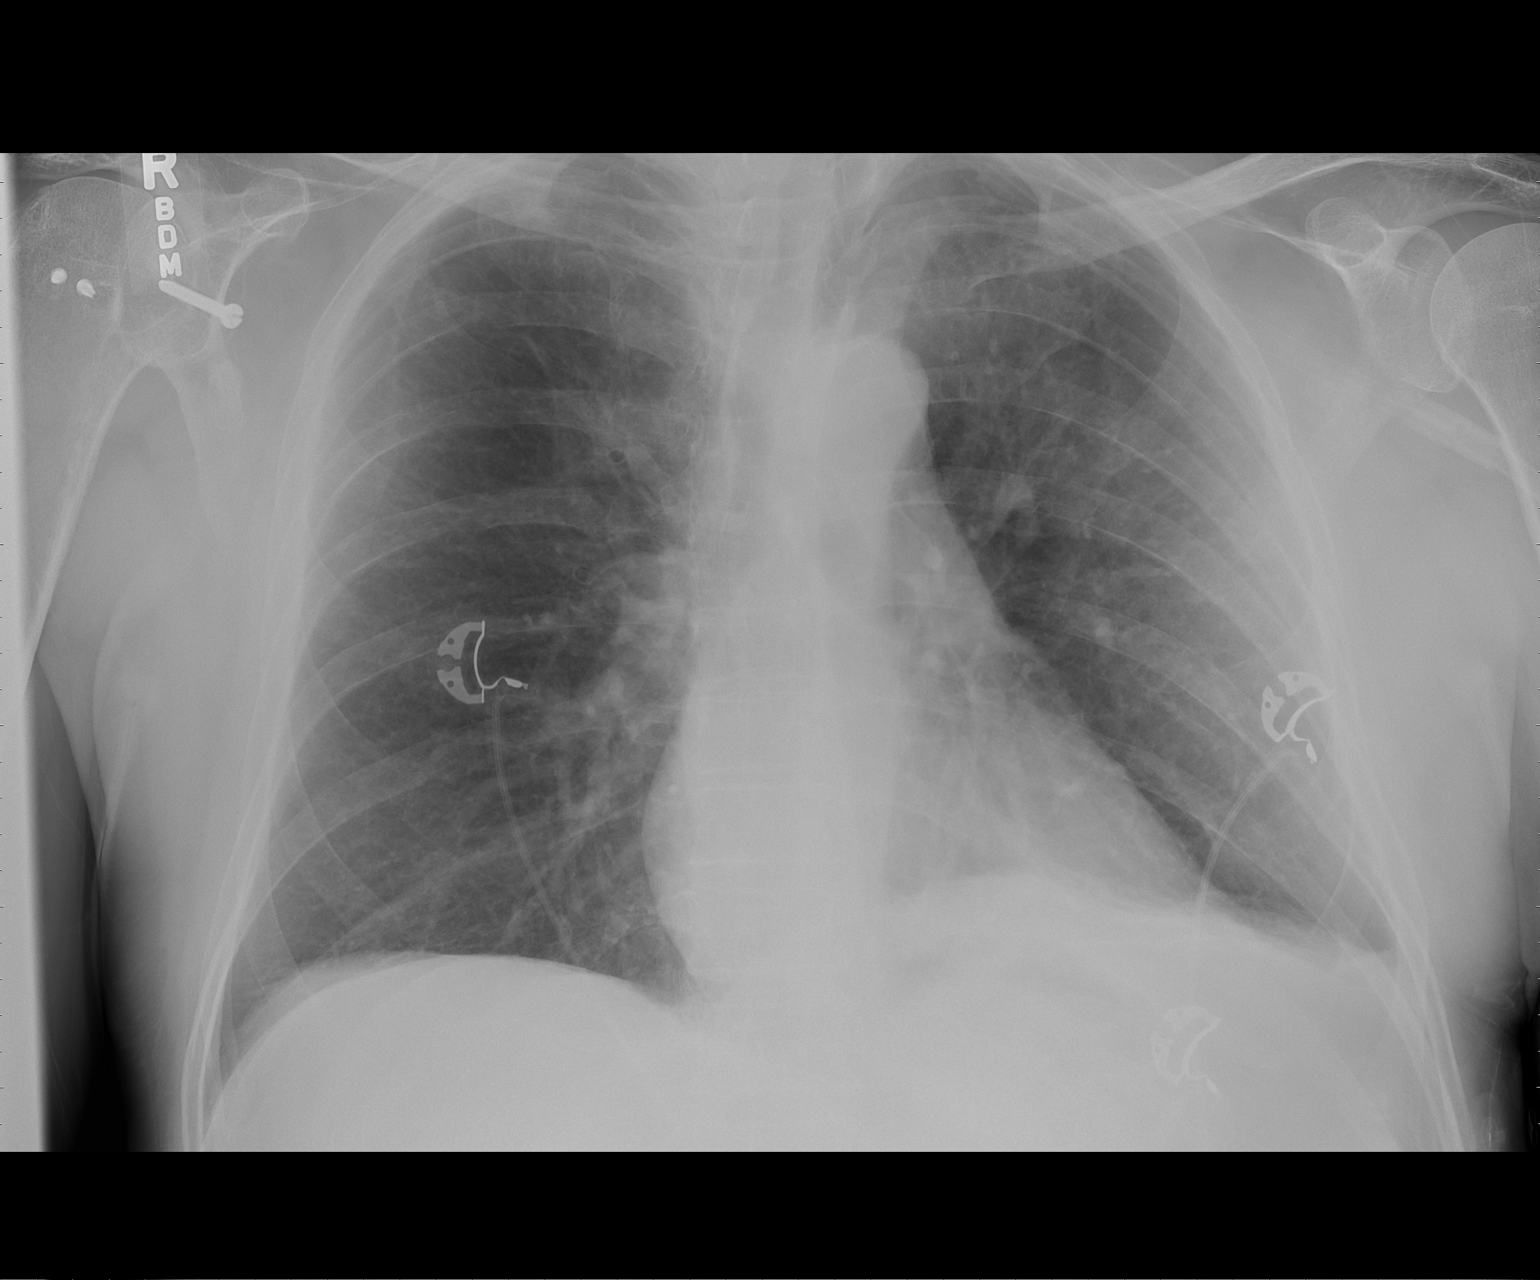

[1 of 1 positions shown; findings below may reference images not displayed]

FINDINGS: There is mild atelectasis at the left lung base, and a
small left pleural effusion cannot be excluded.  The right lung is
clear.  Heart size is stable.  Mediastinal contours are stable.  A
screw is noted through the right glenoid.
IMPRESSION: Mild left basilar atelectasis and possible small left effusion.

## 2012-11-28 IMAGING — CR DG ABDOMEN ACUTE W/ 1V CHEST
3 series · 3 of 3 positions shown · non-contrast
Comparison: 06/11/2011

CLINICAL DATA: Lower back pain

ACUTE ABDOMEN SERIES (ABDOMEN 2 VIEW & CHEST 1 VIEW)

[w chest pa]
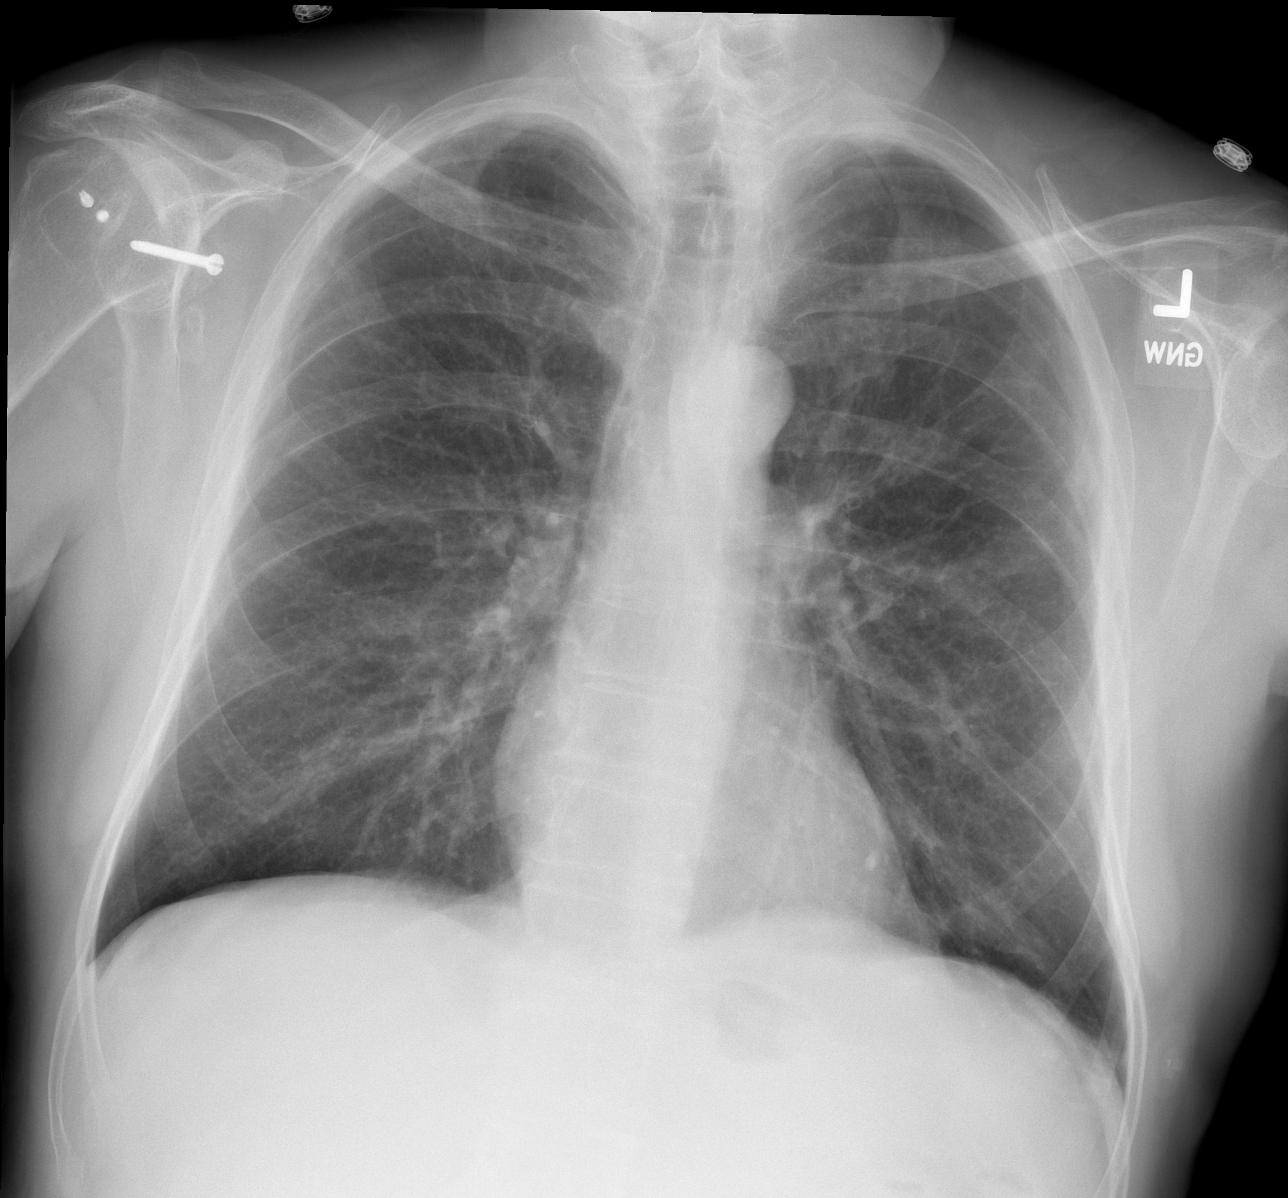

[w abdomen upright]
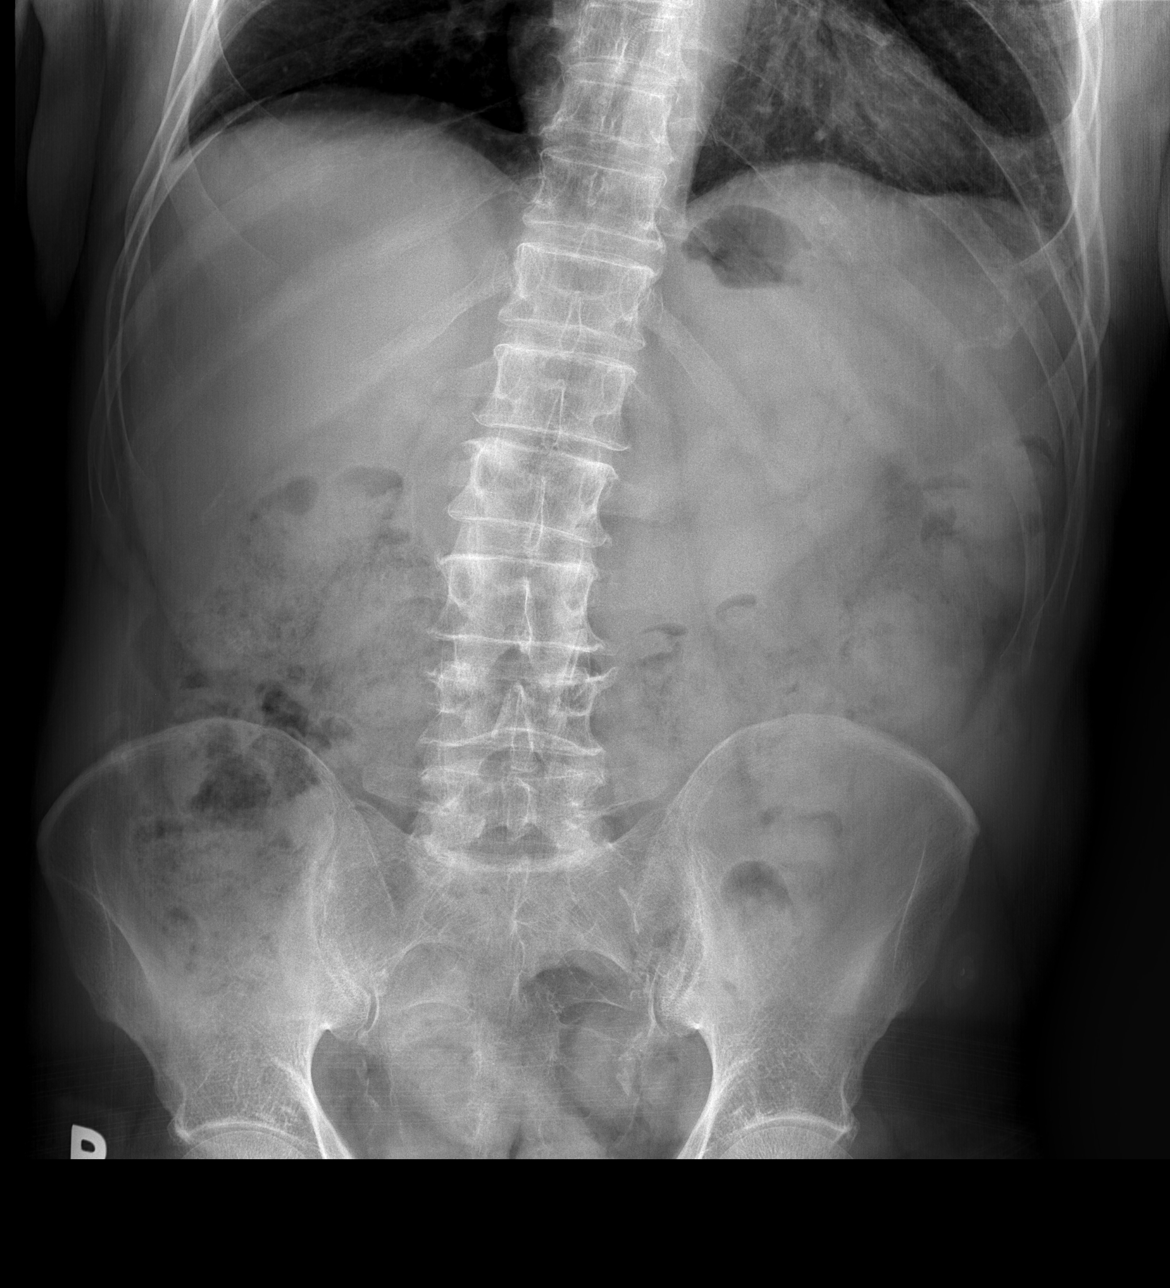

[t abdomen supine]
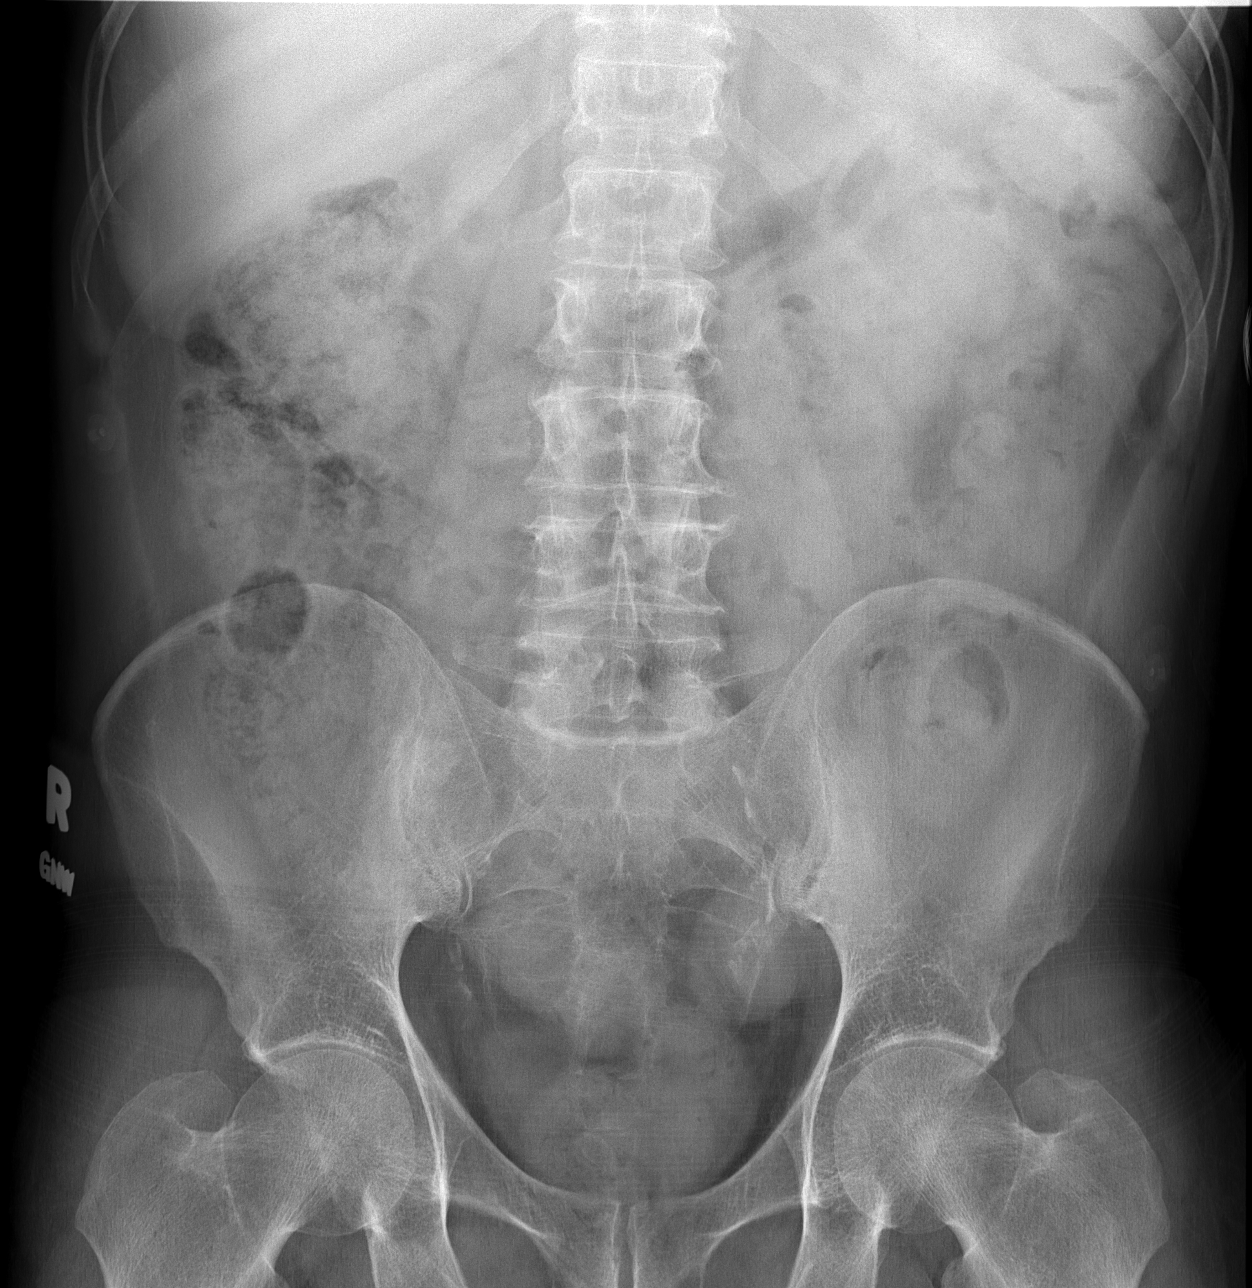

[3 of 3 positions shown; findings below may reference images not displayed]

FINDINGS: Cardiomediastinal silhouette is stable. No acute
infiltrate or pleural effusion.  No pulmonary edema.  There is
nonspecific nonobstructive bowel gas pattern.  Significant stool
noted in the right colon and transverse colon.  Mild degenerative
changes lumbar spine.

No free abdominal air.
IMPRESSION: No acute disease.  Significant colonic stool proximal colon.  No
free abdominal air.  Nonspecific nonobstructive bowel gas pattern.

## 2013-08-15 ENCOUNTER — Encounter: Payer: Self-pay | Admitting: Cardiovascular Disease

## 2014-01-18 ENCOUNTER — Observation Stay (HOSPITAL_COMMUNITY)
Admission: EM | Admit: 2014-01-18 | Discharge: 2014-01-19 | Disposition: A | Discharge status: Home / Self Care | Payer: 59 | Attending: Cardiovascular Disease | Admitting: Cardiovascular Disease

## 2014-01-18 ENCOUNTER — Emergency Department (HOSPITAL_COMMUNITY): Payer: Self-pay

## 2014-01-18 ENCOUNTER — Encounter (HOSPITAL_COMMUNITY): Payer: Self-pay | Admitting: Emergency Medicine

## 2014-01-18 DIAGNOSIS — R0602 Shortness of breath: Secondary | ICD-10-CM | POA: Insufficient documentation

## 2014-01-18 DIAGNOSIS — I200 Unstable angina: Secondary | ICD-10-CM

## 2014-01-18 DIAGNOSIS — F419 Anxiety disorder, unspecified: Secondary | ICD-10-CM | POA: Diagnosis present

## 2014-01-18 DIAGNOSIS — I209 Angina pectoris, unspecified: Secondary | ICD-10-CM | POA: Insufficient documentation

## 2014-01-18 DIAGNOSIS — I2510 Atherosclerotic heart disease of native coronary artery without angina pectoris: Secondary | ICD-10-CM | POA: Diagnosis present

## 2014-01-18 DIAGNOSIS — I739 Peripheral vascular disease, unspecified: Secondary | ICD-10-CM | POA: Diagnosis present

## 2014-01-18 DIAGNOSIS — G479 Sleep disorder, unspecified: Secondary | ICD-10-CM | POA: Insufficient documentation

## 2014-01-18 DIAGNOSIS — R079 Chest pain, unspecified: Secondary | ICD-10-CM | POA: Insufficient documentation

## 2014-01-18 DIAGNOSIS — Z87442 Personal history of urinary calculi: Secondary | ICD-10-CM | POA: Insufficient documentation

## 2014-01-18 DIAGNOSIS — I129 Hypertensive chronic kidney disease with stage 1 through stage 4 chronic kidney disease, or unspecified chronic kidney disease: Secondary | ICD-10-CM | POA: Insufficient documentation

## 2014-01-18 DIAGNOSIS — F17200 Nicotine dependence, unspecified, uncomplicated: Secondary | ICD-10-CM | POA: Insufficient documentation

## 2014-01-18 DIAGNOSIS — R61 Generalized hyperhidrosis: Secondary | ICD-10-CM | POA: Insufficient documentation

## 2014-01-18 DIAGNOSIS — M6282 Rhabdomyolysis: Secondary | ICD-10-CM | POA: Insufficient documentation

## 2014-01-18 DIAGNOSIS — R0789 Other chest pain: Principal | ICD-10-CM | POA: Insufficient documentation

## 2014-01-18 DIAGNOSIS — F418 Other specified anxiety disorders: Secondary | ICD-10-CM | POA: Diagnosis present

## 2014-01-18 DIAGNOSIS — I25119 Atherosclerotic heart disease of native coronary artery with unspecified angina pectoris: Secondary | ICD-10-CM

## 2014-01-18 DIAGNOSIS — E785 Hyperlipidemia, unspecified: Secondary | ICD-10-CM | POA: Diagnosis present

## 2014-01-18 DIAGNOSIS — Z79899 Other long term (current) drug therapy: Secondary | ICD-10-CM | POA: Insufficient documentation

## 2014-01-18 DIAGNOSIS — R112 Nausea with vomiting, unspecified: Secondary | ICD-10-CM | POA: Insufficient documentation

## 2014-01-18 DIAGNOSIS — I10 Essential (primary) hypertension: Secondary | ICD-10-CM

## 2014-01-18 DIAGNOSIS — I252 Old myocardial infarction: Secondary | ICD-10-CM | POA: Insufficient documentation

## 2014-01-18 DIAGNOSIS — F3130 Bipolar disorder, current episode depressed, mild or moderate severity, unspecified: Secondary | ICD-10-CM | POA: Insufficient documentation

## 2014-01-18 DIAGNOSIS — F341 Dysthymic disorder: Secondary | ICD-10-CM | POA: Insufficient documentation

## 2014-01-18 DIAGNOSIS — Z9889 Other specified postprocedural states: Secondary | ICD-10-CM | POA: Insufficient documentation

## 2014-01-18 DIAGNOSIS — Z9861 Coronary angioplasty status: Secondary | ICD-10-CM | POA: Insufficient documentation

## 2014-01-18 DIAGNOSIS — K219 Gastro-esophageal reflux disease without esophagitis: Secondary | ICD-10-CM | POA: Insufficient documentation

## 2014-01-18 DIAGNOSIS — I4891 Unspecified atrial fibrillation: Secondary | ICD-10-CM | POA: Insufficient documentation

## 2014-01-18 DIAGNOSIS — N189 Chronic kidney disease, unspecified: Secondary | ICD-10-CM | POA: Insufficient documentation

## 2014-01-18 LAB — BASIC METABOLIC PANEL
ANION GAP: 16 — AB (ref 5–15)
BUN: 5 mg/dL — ABNORMAL LOW (ref 6–23)
CALCIUM: 9.7 mg/dL (ref 8.4–10.5)
CO2: 25 mEq/L (ref 19–32)
CREATININE: 0.78 mg/dL (ref 0.50–1.35)
Chloride: 99 mEq/L (ref 96–112)
Glucose, Bld: 119 mg/dL — ABNORMAL HIGH (ref 70–99)
Potassium: 3.7 mEq/L (ref 3.7–5.3)
SODIUM: 140 meq/L (ref 137–147)

## 2014-01-18 LAB — CBC
HCT: 48.2 % (ref 39.0–52.0)
Hemoglobin: 17 g/dL (ref 13.0–17.0)
MCH: 35.3 pg — AB (ref 26.0–34.0)
MCHC: 35.3 g/dL (ref 30.0–36.0)
MCV: 100.2 fL — AB (ref 78.0–100.0)
PLATELETS: 215 10*3/uL (ref 150–400)
RBC: 4.81 MIL/uL (ref 4.22–5.81)
RDW: 12.2 % (ref 11.5–15.5)
WBC: 5.9 10*3/uL (ref 4.0–10.5)

## 2014-01-18 LAB — I-STAT TROPONIN, ED: TROPONIN I, POC: 0.01 ng/mL (ref 0.00–0.08)

## 2014-01-18 LAB — PROTIME-INR
INR: 0.93 (ref 0.00–1.49)
PROTHROMBIN TIME: 12.5 s (ref 11.6–15.2)

## 2014-01-18 LAB — TROPONIN I: Troponin I: <0.3 ng/mL (ref <0.30)

## 2014-01-18 LAB — TSH: TSH: 2.26 u[IU]/mL (ref 0.350–4.500)

## 2014-01-18 MED ORDER — ASPIRIN 300 MG RE SUPP: 300.0000 mg | RECTAL | Status: AC

## 2014-01-18 MED ORDER — SODIUM CHLORIDE 0.9 % IJ SOLN: 3.0000 mL | INTRAMUSCULAR | Status: DC | PRN

## 2014-01-18 MED ORDER — ASPIRIN 81 MG PO CHEW: 324.0000 mg | CHEWABLE_TABLET | ORAL | Status: AC

## 2014-01-18 MED ORDER — ASPIRIN EC 81 MG PO TBEC
81.0000 mg | DELAYED_RELEASE_TABLET | Freq: Every day | ORAL | Status: DC
  Administered 2014-01-19: 81 mg via ORAL
  Filled 2014-01-18: qty 1

## 2014-01-18 MED ORDER — SODIUM CHLORIDE 0.9 % IV SOLN: 250.0000 mL | INTRAVENOUS | Status: DC | PRN

## 2014-01-18 MED ORDER — METOPROLOL TARTRATE 25 MG PO TABS
25.0000 mg | ORAL_TABLET | Freq: Two times a day (BID) | ORAL | Status: DC
  Administered 2014-01-18 – 2014-01-19 (×2): 25 mg via ORAL
  Filled 2014-01-18 (×3): qty 1

## 2014-01-18 MED ORDER — ONDANSETRON HCL 4 MG/2ML IJ SOLN: 4.0000 mg | Freq: Four times a day (QID) | INTRAMUSCULAR | Status: DC | PRN

## 2014-01-18 MED ORDER — NITROGLYCERIN 0.4 MG SL SUBL: 0.4000 mg | SUBLINGUAL_TABLET | SUBLINGUAL | Status: DC | PRN

## 2014-01-18 MED ORDER — REGADENOSON 0.4 MG/5ML IV SOLN
0.4000 mg | Freq: Once | INTRAVENOUS | Status: AC
  Administered 2014-01-19: 0.4 mg via INTRAVENOUS
  Filled 2014-01-18: qty 5

## 2014-01-18 MED ORDER — ASPIRIN 81 MG PO CHEW
162.0000 mg | CHEWABLE_TABLET | Freq: Once | ORAL | Status: AC
  Administered 2014-01-18: 162 mg via ORAL
  Filled 2014-01-18: qty 2

## 2014-01-18 MED ORDER — ATORVASTATIN CALCIUM 80 MG PO TABS
80.0000 mg | ORAL_TABLET | Freq: Every day | ORAL | Status: DC
  Administered 2014-01-18: 80 mg via ORAL
  Filled 2014-01-18 (×2): qty 1

## 2014-01-18 MED ORDER — SODIUM CHLORIDE 0.9 % IJ SOLN: 3.0000 mL | Freq: Two times a day (BID) | INTRAMUSCULAR | Status: DC

## 2014-01-18 MED ORDER — ACETAMINOPHEN 325 MG PO TABS
650.0000 mg | ORAL_TABLET | ORAL | Status: DC | PRN
  Administered 2014-01-18 – 2014-01-19 (×2): 650 mg via ORAL
  Filled 2014-01-18 (×2): qty 2

## 2014-01-18 MED ORDER — LORAZEPAM 1 MG PO TABS
0.5000 mg | ORAL_TABLET | Freq: Once | ORAL | Status: AC
  Administered 2014-01-18: 0.5 mg via ORAL
  Filled 2014-01-18: qty 1
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
## 2014-01-18 NOTE — ED Notes (Signed)
MD at bedside. 

## 2014-01-18 NOTE — ED Notes (Signed)
Pt states that the pain started yesterday while he was sitting on the cough. Pt states that he took 1 nitro with some relief and well as 1 baby asprin. Nothing reported to make pain worse or better. Pain is constant.

## 2014-01-18 NOTE — ED Provider Notes (Addendum)
Medical screening examination/treatment/procedure(s) were conducted as a shared visit with non-physician practitioner(s) and myself.  I personally evaluated the patient during the encounter.   EKG Interpretation   Date/Time:  Friday January 18 2014 16:15:55 EDT Ventricular Rate:  84 PR Interval:  128 QRS Duration: 102 QT Interval:  392 QTC Calculation: 463 R Axis:   70 Text Interpretation:  Normal sinus rhythm ST \\T \ T wave abnormality,  consider inferior ischemia Abnormal ECG No significant change since last  tracing Confirmed by Anna Beaird,  DO, [place] [phone]) on 01/18/2014 6:36:11 PM      Pt is a 53 y.o. M with significant history of coronary artery disease, prior MIs, less current catheterization in 2012 with stents who presents emergency department with left-sided "pinching" chest pain, shortness of breath, diaphoresis, dizziness that started last night and worsened today. Resolved completely with nitroglycerin. He is now pain free. EKG shows no new ischemic changes. Troponin negative. Chest x-ray clear. Patient does not want admission to the hospital. [facility] have cardiology see him and repeat troponin. Disposition per cardiology.  [place], DO 01/18/14 [place], DO 01/18/14 1946

## 2014-01-18 NOTE — ED Notes (Signed)
Pt reports taking 81mg  ASA and 1 SL nitro with relief PTA. Pt states that CP is coming back
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
## 2014-01-19 ENCOUNTER — Observation Stay (HOSPITAL_COMMUNITY): Payer: Self-pay

## 2014-01-19 ENCOUNTER — Observation Stay (HOSPITAL_COMMUNITY): Payer: 59

## 2014-01-19 DIAGNOSIS — I2510 Atherosclerotic heart disease of native coronary artery without angina pectoris: Secondary | ICD-10-CM

## 2014-01-19 DIAGNOSIS — I209 Angina pectoris, unspecified: Secondary | ICD-10-CM

## 2014-01-19 DIAGNOSIS — I10 Essential (primary) hypertension: Secondary | ICD-10-CM

## 2014-01-19 DIAGNOSIS — R079 Chest pain, unspecified: Secondary | ICD-10-CM

## 2014-01-19 LAB — CBC
HCT: 42.6 % (ref 39.0–52.0)
Hemoglobin: 14.9 g/dL (ref 13.0–17.0)
MCH: 34.4 pg — ABNORMAL HIGH (ref 26.0–34.0)
MCHC: 35 g/dL (ref 30.0–36.0)
MCV: 98.4 fL (ref 78.0–100.0)
PLATELETS: 192 10*3/uL (ref 150–400)
RBC: 4.33 MIL/uL (ref 4.22–5.81)
RDW: 12.2 % (ref 11.5–15.5)
WBC: 5.7 10*3/uL (ref 4.0–10.5)

## 2014-01-19 LAB — HEMOGLOBIN A1C
Hgb A1c MFr Bld: 5.3 % (ref <5.7)
Mean Plasma Glucose: 105 mg/dL (ref <117)

## 2014-01-19 LAB — COMPREHENSIVE METABOLIC PANEL
ALK PHOS: 90 U/L (ref 39–117)
ALT: 28 U/L (ref 0–53)
AST: 27 U/L (ref 0–37)
Albumin: 3.9 g/dL (ref 3.5–5.2)
Anion gap: 14 (ref 5–15)
BUN: 5 mg/dL — ABNORMAL LOW (ref 6–23)
CO2: 24 mEq/L (ref 19–32)
Calcium: 9.1 mg/dL (ref 8.4–10.5)
Chloride: 101 mEq/L (ref 96–112)
Creatinine, Ser: 0.88 mg/dL (ref 0.50–1.35)
GFR calc Af Amer: >90 mL/min (ref >90)
GFR calc non Af Amer: >90 mL/min (ref >90)
Glucose, Bld: 94 mg/dL (ref 70–99)
POTASSIUM: 3.8 meq/L (ref 3.7–5.3)
SODIUM: 139 meq/L (ref 137–147)
TOTAL PROTEIN: 6.8 g/dL (ref 6.0–8.3)
Total Bilirubin: 0.6 mg/dL (ref 0.3–1.2)

## 2014-01-19 LAB — LIPID PANEL
CHOL/HDL RATIO: 4.7 ratio
Cholesterol: 248 mg/dL — ABNORMAL HIGH (ref 0–200)
HDL: 53 mg/dL (ref >39)
LDL Cholesterol: 142 mg/dL — ABNORMAL HIGH (ref 0–99)
Triglycerides: 266 mg/dL — ABNORMAL HIGH (ref <150)
VLDL: 53 mg/dL — AB (ref 0–40)

## 2014-01-19 LAB — TROPONIN I
Troponin I: <0.3 ng/mL (ref <0.30)
Troponin I: <0.3 ng/mL (ref <0.30)

## 2014-01-19 MED ORDER — ASPIRIN 81 MG PO TBEC: 81.0000 mg | DELAYED_RELEASE_TABLET | Freq: Every day | ORAL | Status: DC

## 2014-01-19 MED ORDER — PNEUMOCOCCAL VAC POLYVALENT 25 MCG/0.5ML IJ INJ: 0.5000 mL | INJECTION | INTRAMUSCULAR | Status: DC

## 2014-01-19 MED ORDER — METOPROLOL TARTRATE 25 MG PO TABS: 25.0000 mg | ORAL_TABLET | Freq: Two times a day (BID) | ORAL | Status: DC

## 2014-01-19 MED ORDER — TECHNETIUM TC 99M SESTAMIBI - CARDIOLITE
30.0000 | Freq: Once | INTRAVENOUS | Status: AC | PRN
  Administered 2014-01-19: 09:00:00 30 via INTRAVENOUS

## 2014-01-19 MED ORDER — ATORVASTATIN CALCIUM 80 MG PO TABS: 80.0000 mg | ORAL_TABLET | Freq: Every day | ORAL | Status: DC

## 2014-01-19 MED ORDER — TECHNETIUM TC 99M SESTAMIBI GENERIC - CARDIOLITE
10.0000 | Freq: Once | INTRAVENOUS | Status: AC | PRN
  Administered 2014-01-19: 10 via INTRAVENOUS

## 2014-01-19 MED ORDER — REGADENOSON 0.4 MG/5ML IV SOLN
INTRAVENOUS | Status: AC
  Filled 2014-01-19: qty 5

## 2014-01-19 NOTE — Discharge Summary (Signed)
Patient seen and examined and history reviewed. Agree with above findings and plan. See earlier rounding note. Myoview negative for ischemia.  [name], [place] 01/19/2014 6:28 PM

## 2014-01-19 NOTE — Care Management Utilization Note (Signed)
UR completed.    Micheala Morissette Wise Malashia Kamaka, RN, BSN Phone #[phone]  

## 2014-01-19 NOTE — Progress Notes (Signed)
Patient Profile: 53 y.o. male, followed by Dr. [name], with a history of HTN, HLD, ongoing tobacco abuse, bipolar disorder, depression, GERD and CAD s/p inferior wall MI in 7616 complicated by cardiogenic shock and ventricular fibrillation. At that time, he underwent PCI utilizing 2 DES to the RCA. He presetned to the [facility] ED on 01/18/14 with chest pain symptoms concerning for angina. Enzyme negative x 3.     Subjective: Currently CP free. Had mild dyspnea after injection of Lexiscan, during NST. Symptoms resolved by test completion.   Objective: Vital signs in last 24 hours: Temp:  [97.4 F (36.3 C)-97.9 F (36.6 C)] 97.4 F (36.3 C) (07/25 0356) Pulse Rate:  [65-126] 88 (07/25 0916) Resp:  [14-18] 18 (07/25 0356) BP: (144-179)/(88-106) 144/95 mmHg (07/25 0916) SpO2:  [97 %-99 %] 97 % (07/25 0356) Weight:  [150 lb (68.04 kg)] 150 lb (68.04 kg) (07/25 0356)    Intake/Output from previous day:   Intake/Output this shift:    Medications Current Facility-Administered Medications  Medication Dose Route Frequency Provider Last Rate Last Dose  . 0.9 %  sodium chloride infusion  250 mL Intravenous PRN Perry Mount, PA-C      . acetaminophen (TYLENOL) tablet 650 mg  650 mg Oral Q4H PRN Perry Mount, PA-C   650 mg at 01/18/14 2155  . aspirin EC tablet 81 mg  81 mg Oral Daily Perry Mount, PA-C      . atorvastatin (LIPITOR) tablet 80 mg  80 mg Oral q1800 Perry Mount, PA-C   80 mg at 01/18/14 2155  . metoprolol tartrate (LOPRESSOR) tablet 25 mg  25 mg Oral BID Perry Mount, PA-C   25 mg at 01/18/14 2155  . nitroGLYCERIN (NITROSTAT) SL tablet 0.4 mg  0.4 mg Sublingual Q5 Min x 3 PRN Perry Mount, PA-C      . ondansetron [facility]) injection 4 mg  4 mg Intravenous Q6H PRN Perry Mount, PA-C      . [START ON 01/20/2014] pneumococcal 23 valent vaccine (PNU-IMMUNE) injection 0.5 mL  0.5 mL Intramuscular Tomorrow-1000 Mihai Croitoru, MD      . regadenoson (LEXISCAN) 0.4 MG/5ML injection SOLN            . sodium chloride 0.9 % injection 3 mL  3 mL Intravenous Q12H Perry Mount, PA-C      . sodium chloride 0.9 % injection 3 mL  3 mL Intravenous PRN Perry Mount, PA-C      . technetium sestamibi (CARDIOLITE) injection 30 milli Curie  30 milli Curie Intravenous Once PRN Medication Radiologist, MD        PE: General appearance: alert, cooperative and no distress Lungs: clear to auscultation bilaterally Heart: regular rate and rhythm, S1, S2 normal, no murmur, click, rub or gallop Extremities: no LEE Pulses: 2+ and symmetric Skin: warm and dry Neurologic: Grossly normal  Lab Results:   Recent Labs  01/18/14 1628 01/19/14 0302  WBC 5.9 5.7  HGB 17.0 14.9  HCT 48.2 42.6  PLT 215 192   BMET  Recent Labs  01/18/14 1628 01/19/14 0302  NA 140 139  K 3.7 3.8  CL 99 101  CO2 25 24  GLUCOSE 119* 94  BUN 5* 5*  CREATININE 0.78 0.88  CALCIUM 9.7 9.1   PT/INR  Recent Labs  01/18/14 2203  LABPROT 12.5  INR 0.93   Cholesterol  Recent Labs  01/19/14 0302  CHOL 248*   Cardiac Panel (last 3 results)  Recent Labs  01/18/14 2203 01/19/14 0302  TROPONINI <0.30 <0.30    Studies/Results:  NST - completed. Images/ interpretation pending.   Assessment/Plan    Active Problems:   TOBACCO USE   HYPERLIPIDEMIA   GERD   Anxiety   Coronary artery disease   Depression with anxiety   PVD (peripheral vascular disease)   Chest pain  1. Chest Pain: symptoms are concerning for angina. Cardiac enzymes negative x 3. Currently CP free. NST completed w/o significant ST changes. Radiologist interpretation to follow. If abnormal, plan for diagnostic LHC on Monday. Plan for discharge home if normal.  2: HLD: poorly controlled. LDL elevated at 142. Triglycerides 266. Total 248. Continue statin therapy.   3. HTN: moderately elevated. Only on BB. ? Adding ACE-I and/or long acting nitrate.    LOS: 1 day    Brittainy M. Ladoris Gene 01/19/2014 9:19 AM  Patient seen  and examined and history reviewed. Agree with above findings and plan. Patient reports he still has some "pinching pain" in the left precordium. Cardiac enzymes are all normal. Ecg is normal. Plan Myoview this am. If normal will DC later today and follow up with Dr. [name]. If abnormal will keep and plan cardiac cath Monday.  Shereese Bonnie Martinique, [place] 01/19/2014 10:15 AM

## 2014-01-19 NOTE — Discharge Summary (Signed)
Physician Discharge Summary  Patient ID: [name] MRN: [id number] DOB/AGE: January 23, 1961 53 y.o.  Admit date: 01/18/2014 Discharge date: 01/19/2014  Admission Diagnoses: Chest Pain  Discharge Diagnoses:  Active Problems:   TOBACCO USE   HYPERLIPIDEMIA   GERD   Anxiety   Coronary artery disease   Depression with anxiety   PVD (peripheral vascular disease)   Chest pain   Discharged Condition: stable  Hospital Course: The patient is a 53 y.o. male, followed by Dr. [name], with a history of HTN, HLD, ongoing tobacco abuse, bipolar disorder, depression, GERD and CAD s/p inferior wall MI in 0932 complicated by cardiogenic shock and ventricular fibrillation. At that time, he underwent PCI utilizing 2 DES to the RCA.   He presetned to the [facility] ED on 01/18/14 with chest pain symptoms concerning for angina. He endorsed left-sided chest pain, occurring at rest. It was described as a "pinching" sensation with radiation to his left arm. He had associated shortness of breath, diaphoresis, nausea with mild vomiting.  The chest pain was reminiscent of his cardiac chest pain prior to his MI. It was relieved after taking one sublingual nitroglycerin. He also admitted to having some anxiety regarding his chest pain, as he had prior MI. 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 He has not taken any of his medications for over a year due to no insurance coverage.  In the ED, his EKG was without ischemic changes. Point of care troponin was negative. Given his prior history and symptomatology, he was admitted to telemetry for observation. Cardiac enzymes were cycled and were negative x3. He was restarted on aspirin a beta blocker as well as a statin. Of note, a fasting lipid panel was obtained, which demonstrated an elevated LDL of 142 mg/dL. Triglycerides were also elevated at 266 mg/dL. He ultimately had resolution of his chest pain. He underwent a nuclear stress test that was  negative for ischemia. Left ventricular ejection fraction was estimated at 48%. He denied any recurrent chest pain symptoms. He was last seen and examined by Dr. Martinique, who determined that he was stable for discharge home. He was discharged on 81 mg of aspirin, 25 mg of Lopressor twice a day and 80 mg of Lipitor. Post hospital followup will be arranged with Dr. [name] or an extender in 2-3 weeks.    Consults: None  Significant Diagnostic Studies:   NST 01/19/14 FINDINGS:  Immediate post regadenoson images demonstrate slight diminished  activity in the distal inferior wall relative to the remainder of  the myocardium. No focal perfusion defects elsewhere. Initial  resting images demonstrate similar findings. No evidence of  reversibility to suggest ischemia. Findings confirmed by the  computer generated polar map.  Gated images demonstrate perhaps minimal hypokinesis involving the  inferior wall, but overall, wall motion appears relatively normal.  Estimated QGS left ventricular ejection fraction measured 48%, with  an end-diastolic volume of 355 ml and an end systolic volume of 55  ml.  IMPRESSION:  1. Probable old MI involving the distal inferior wall. No evidence  of myocardial ischemia.  2. Perhaps minimal hypokinesis involving the inferior wall, though  left ventricular wall motion appears relatively normal.  3. Estimated QGS ejection fraction 48%.   Treatments:  See Hospital Course  Discharge Exam: Blood pressure 128/89, pulse 63, temperature 97.8 F (36.6 C), temperature source Oral, resp. rate 18, height 5\' 6"  (1.676 m), weight 150 lb (68.04 kg), SpO2 97.00%.   Disposition: 01-Home  or Self Care      Discharge Instructions   Diet - low sodium heart healthy    Complete by:  As directed      Increase activity slowly    Complete by:  As directed             Medication List         aspirin 81 MG EC tablet  Take 1 tablet (81 mg total) by mouth daily.      atorvastatin 80 MG tablet  Commonly known as:  LIPITOR  Take 1 tablet (80 mg total) by mouth daily at 6 PM.     metoprolol tartrate 25 MG tablet  Commonly known as:  LOPRESSOR  Take 1 tablet (25 mg total) by mouth 2 (two) times daily.     multivitamin tablet  Take 1 tablet by mouth daily.     nitroGLYCERIN 0.4 MG SL tablet  Commonly known as:  NITROSTAT  1 tablet under tongue at onset of chest pain, may repeat every 23min up to 3 doses       Follow-up Information   Follow up with [name], MD. (our office willl call you with an appointment to follow-up with Dr. [name] or one of PAs/NPs)    Specialty:  Cardiology   Contact information:   [address] [address] [phone]      TIME SPENT ON DISCHARGE, INCLUDING PHYSICIAN TIME: >30 MINUTES   Signed: Lyda Jester 01/19/2014, 3:37 PM

## 2014-03-25 ENCOUNTER — Encounter (HOSPITAL_COMMUNITY): Payer: Self-pay

## 2014-03-25 ENCOUNTER — Encounter: Payer: Self-pay | Admitting: Cardiovascular Disease

## 2014-03-25 ENCOUNTER — Ambulatory Visit (INDEPENDENT_AMBULATORY_CARE_PROVIDER_SITE_OTHER): Payer: 59 | Admitting: Cardiovascular Disease

## 2014-03-25 VITALS — HR 88 | Ht 66.0 in | Wt 156.0 lb

## 2014-03-25 DIAGNOSIS — I209 Angina pectoris, unspecified: Secondary | ICD-10-CM

## 2014-03-25 DIAGNOSIS — I2510 Atherosclerotic heart disease of native coronary artery without angina pectoris: Secondary | ICD-10-CM

## 2014-03-25 DIAGNOSIS — I25119 Atherosclerotic heart disease of native coronary artery with unspecified angina pectoris: Secondary | ICD-10-CM

## 2014-03-25 DIAGNOSIS — R079 Chest pain, unspecified: Secondary | ICD-10-CM

## 2014-03-25 LAB — CBC
HCT: 44.9 % (ref 39.0–52.0)
Hemoglobin: 15.5 g/dL (ref 13.0–17.0)
MCHC: 34.4 g/dL (ref 30.0–36.0)
MCV: 101 fl — ABNORMAL HIGH (ref 78.0–100.0)
PLATELETS: 192 10*3/uL (ref 150.0–400.0)
RBC: 4.45 Mil/uL (ref 4.22–5.81)
RDW: 13 % (ref 11.5–15.5)
WBC: 6 10*3/uL (ref 4.0–10.5)

## 2014-03-25 LAB — LDL CHOLESTEROL, DIRECT: Direct LDL: 95.3 mg/dL

## 2014-03-25 LAB — LIPID PANEL
CHOLESTEROL: 192 mg/dL (ref 0–200)
HDL: 60 mg/dL (ref >39.00)
NonHDL: 132
Total CHOL/HDL Ratio: 3
Triglycerides: 249 mg/dL — ABNORMAL HIGH (ref 0.0–149.0)
VLDL: 49.8 mg/dL — ABNORMAL HIGH (ref 0.0–40.0)

## 2014-03-25 LAB — BASIC METABOLIC PANEL WITH GFR
BUN: 6 mg/dL (ref 6–23)
CO2: 28 meq/L (ref 19–32)
Calcium: 9.3 mg/dL (ref 8.4–10.5)
Chloride: 103 meq/L (ref 96–112)
Creatinine, Ser: 0.9 mg/dL (ref 0.4–1.5)
GFR: 89.59 mL/min
Glucose, Bld: 102 mg/dL — ABNORMAL HIGH (ref 70–99)
Potassium: 3.7 meq/L (ref 3.5–5.1)
Sodium: 137 meq/L (ref 135–145)

## 2014-03-25 LAB — HEPATIC FUNCTION PANEL
ALBUMIN: 4.4 g/dL (ref 3.5–5.2)
ALK PHOS: 96 U/L (ref 39–117)
ALT: 71 U/L — ABNORMAL HIGH (ref 0–53)
AST: 57 U/L — ABNORMAL HIGH (ref 0–37)
Bilirubin, Direct: 0 mg/dL (ref 0.0–0.3)
TOTAL PROTEIN: 7.8 g/dL (ref 6.0–8.3)
Total Bilirubin: 0.8 mg/dL (ref 0.2–1.2)

## 2014-03-25 LAB — PROTIME-INR
INR: 1 ratio (ref 0.8–1.0)
Prothrombin Time: 10.7 s (ref 9.6–13.1)

## 2014-03-25 LAB — CK: CK TOTAL: 113 U/L (ref 7–232)

## 2014-03-25 MED ORDER — NITROGLYCERIN 0.4 MG SL SUBL: SUBLINGUAL_TABLET | SUBLINGUAL | Status: DC

## 2014-03-25 MED ORDER — METOPROLOL TARTRATE 25 MG PO TABS: 25.0000 mg | ORAL_TABLET | Freq: Two times a day (BID) | ORAL | Status: DC

## 2014-03-25 MED ORDER — ATORVASTATIN CALCIUM 80 MG PO TABS: 80.0000 mg | ORAL_TABLET | Freq: Every day | ORAL | Status: DC
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
## 2014-03-25 NOTE — Patient Instructions (Addendum)
Your physician has requested that you have a cardiac catheterization. Cardiac catheterization is used to diagnose and/or treat various heart conditions. Doctors may recommend this procedure for a number of different reasons. The most common reason is to evaluate chest pain. Chest pain can be a symptom of coronary artery disease (CAD), and cardiac catheterization can show whether plaque is narrowing or blocking your heart's arteries. This procedure is also used to evaluate the valves, as well as measure the blood flow and oxygen levels in different parts of your heart. For further information please visit [facility]. Please follow instruction sheet, as given.  Your physician recommends that you continue on your current medications as directed. Please refer to the Current Medication list given to you today.  Your physician recommends that you have lab work today: LIPID, LIVER, CK Total, BMP, CBC and PT/INR

## 2014-03-26 ENCOUNTER — Encounter (HOSPITAL_COMMUNITY): Payer: Self-pay | Admitting: General Practice

## 2014-03-26 ENCOUNTER — Encounter (HOSPITAL_COMMUNITY)
Admission: RE | Disposition: A | Discharge status: Home / Self Care | Payer: Self-pay | Source: Ambulatory Visit | Attending: Cardiovascular Disease

## 2014-03-26 ENCOUNTER — Ambulatory Visit (HOSPITAL_COMMUNITY)
Admission: RE | Admit: 2014-03-26 | Discharge: 2014-03-27 | Disposition: A | Discharge status: Home / Self Care | Payer: Self-pay | Source: Ambulatory Visit | Attending: Cardiovascular Disease | Admitting: Cardiovascular Disease

## 2014-03-26 DIAGNOSIS — F319 Bipolar disorder, unspecified: Secondary | ICD-10-CM | POA: Insufficient documentation

## 2014-03-26 DIAGNOSIS — E785 Hyperlipidemia, unspecified: Secondary | ICD-10-CM | POA: Diagnosis present

## 2014-03-26 DIAGNOSIS — I2089 Other forms of angina pectoris: Secondary | ICD-10-CM | POA: Diagnosis present

## 2014-03-26 DIAGNOSIS — Z7982 Long term (current) use of aspirin: Secondary | ICD-10-CM | POA: Insufficient documentation

## 2014-03-26 DIAGNOSIS — M6282 Rhabdomyolysis: Secondary | ICD-10-CM | POA: Diagnosis present

## 2014-03-26 DIAGNOSIS — T82897A Other specified complication of cardiac prosthetic devices, implants and grafts, initial encounter: Secondary | ICD-10-CM | POA: Insufficient documentation

## 2014-03-26 DIAGNOSIS — F17200 Nicotine dependence, unspecified, uncomplicated: Secondary | ICD-10-CM | POA: Diagnosis present

## 2014-03-26 DIAGNOSIS — R740 Nonspecific elevation of levels of transaminase and lactic acid dehydrogenase [LDH]: Secondary | ICD-10-CM

## 2014-03-26 DIAGNOSIS — R7401 Elevation of levels of liver transaminase levels: Secondary | ICD-10-CM

## 2014-03-26 DIAGNOSIS — R7402 Elevation of levels of lactic acid dehydrogenase (LDH): Secondary | ICD-10-CM | POA: Insufficient documentation

## 2014-03-26 DIAGNOSIS — I208 Other forms of angina pectoris: Secondary | ICD-10-CM | POA: Diagnosis present

## 2014-03-26 DIAGNOSIS — N189 Chronic kidney disease, unspecified: Secondary | ICD-10-CM | POA: Insufficient documentation

## 2014-03-26 DIAGNOSIS — Z955 Presence of coronary angioplasty implant and graft: Secondary | ICD-10-CM

## 2014-03-26 DIAGNOSIS — Z9861 Coronary angioplasty status: Secondary | ICD-10-CM | POA: Insufficient documentation

## 2014-03-26 DIAGNOSIS — K219 Gastro-esophageal reflux disease without esophagitis: Secondary | ICD-10-CM | POA: Diagnosis present

## 2014-03-26 DIAGNOSIS — Z79899 Other long term (current) drug therapy: Secondary | ICD-10-CM | POA: Insufficient documentation

## 2014-03-26 DIAGNOSIS — I2510 Atherosclerotic heart disease of native coronary artery without angina pectoris: Secondary | ICD-10-CM | POA: Diagnosis present

## 2014-03-26 DIAGNOSIS — I4891 Unspecified atrial fibrillation: Secondary | ICD-10-CM | POA: Insufficient documentation

## 2014-03-26 DIAGNOSIS — Y831 Surgical operation with implant of artificial internal device as the cause of abnormal reaction of the patient, or of later complication, without mention of misadventure at the time of the procedure: Secondary | ICD-10-CM | POA: Insufficient documentation

## 2014-03-26 DIAGNOSIS — I252 Old myocardial infarction: Secondary | ICD-10-CM | POA: Insufficient documentation

## 2014-03-26 DIAGNOSIS — I10 Essential (primary) hypertension: Secondary | ICD-10-CM | POA: Diagnosis present

## 2014-03-26 DIAGNOSIS — I209 Angina pectoris, unspecified: Secondary | ICD-10-CM | POA: Insufficient documentation

## 2014-03-26 DIAGNOSIS — I129 Hypertensive chronic kidney disease with stage 1 through stage 4 chronic kidney disease, or unspecified chronic kidney disease: Secondary | ICD-10-CM | POA: Insufficient documentation

## 2014-03-26 HISTORY — PX: LEFT HEART CATHETERIZATION WITH CORONARY ANGIOGRAM: SHX5451

## 2014-03-26 LAB — POCT ACTIVATED CLOTTING TIME
Activated Clotting Time: 248 seconds
Activated Clotting Time: 270 seconds

## 2014-03-26 SURGERY — LEFT HEART CATHETERIZATION WITH CORONARY ANGIOGRAM
Anesthesia: LOCAL

## 2014-03-26 MED ORDER — HYDROMORPHONE HCL 1 MG/ML IJ SOLN
INTRAMUSCULAR | Status: AC
  Filled 2014-03-26: qty 1

## 2014-03-26 MED ORDER — ATORVASTATIN CALCIUM 80 MG PO TABS
80.0000 mg | ORAL_TABLET | Freq: Every day | ORAL | Status: DC
  Administered 2014-03-26 – 2014-03-27 (×2): 80 mg via ORAL
  Filled 2014-03-26 (×2): qty 1

## 2014-03-26 MED ORDER — INFLUENZA VAC SPLIT QUAD 0.5 ML IM SUSY
0.5000 mL | PREFILLED_SYRINGE | INTRAMUSCULAR | Status: DC
  Filled 2014-03-26: qty 0.5

## 2014-03-26 MED ORDER — MORPHINE SULFATE 4 MG/ML IJ SOLN: 4.0000 mg | INTRAMUSCULAR | Status: DC | PRN

## 2014-03-26 MED ORDER — ACETAMINOPHEN 325 MG PO TABS
650.0000 mg | ORAL_TABLET | ORAL | Status: DC | PRN
  Administered 2014-03-26: 650 mg via ORAL
  Filled 2014-03-26: qty 2

## 2014-03-26 MED ORDER — METOPROLOL TARTRATE 25 MG PO TABS
25.0000 mg | ORAL_TABLET | Freq: Two times a day (BID) | ORAL | Status: DC
  Administered 2014-03-27 (×2): 25 mg via ORAL
  Filled 2014-03-26 (×3): qty 1

## 2014-03-26 MED ORDER — SODIUM CHLORIDE 0.9 % IV SOLN
INTRAVENOUS | Status: AC
  Administered 2014-03-26: 14:00:00 via INTRAVENOUS

## 2014-03-26 MED ORDER — ASPIRIN EC 81 MG PO TBEC
81.0000 mg | DELAYED_RELEASE_TABLET | Freq: Every day | ORAL | Status: DC
  Administered 2014-03-27: 81 mg via ORAL
  Filled 2014-03-26: qty 1

## 2014-03-26 MED ORDER — HEPARIN SODIUM (PORCINE) 1000 UNIT/ML IJ SOLN
INTRAMUSCULAR | Status: AC
  Filled 2014-03-26: qty 1

## 2014-03-26 MED ORDER — OXYCODONE-ACETAMINOPHEN 5-325 MG PO TABS
1.0000 | ORAL_TABLET | ORAL | Status: DC | PRN
  Administered 2014-03-26 – 2014-03-27 (×3): 2 via ORAL
  Filled 2014-03-26 (×3): qty 2

## 2014-03-26 MED ORDER — CLOPIDOGREL BISULFATE 300 MG PO TABS
ORAL_TABLET | ORAL | Status: AC
  Filled 2014-03-26: qty 2

## 2014-03-26 MED ORDER — ONDANSETRON HCL 4 MG/2ML IJ SOLN: 4.0000 mg | Freq: Four times a day (QID) | INTRAMUSCULAR | Status: DC | PRN

## 2014-03-26 MED ORDER — MIDAZOLAM HCL 2 MG/2ML IJ SOLN
INTRAMUSCULAR | Status: AC
  Filled 2014-03-26: qty 2

## 2014-03-26 MED ORDER — LIDOCAINE HCL (PF) 1 % IJ SOLN
INTRAMUSCULAR | Status: AC
  Filled 2014-03-26: qty 30

## 2014-03-26 MED ORDER — FENTANYL CITRATE 0.05 MG/ML IJ SOLN
INTRAMUSCULAR | Status: AC
  Filled 2014-03-26: qty 2

## 2014-03-26 MED ORDER — CLOPIDOGREL BISULFATE 75 MG PO TABS
75.0000 mg | ORAL_TABLET | Freq: Every day | ORAL | Status: DC
  Administered 2014-03-27: 09:00:00 75 mg via ORAL
  Filled 2014-03-26: qty 1

## 2014-03-26 MED ORDER — HYDRALAZINE HCL 20 MG/ML IJ SOLN: 10.0000 mg | INTRAMUSCULAR | Status: DC | PRN

## 2014-03-26 MED ORDER — ASPIRIN 81 MG PO CHEW: 81.0000 mg | CHEWABLE_TABLET | ORAL | Status: DC

## 2014-03-26 MED ORDER — SODIUM CHLORIDE 0.9 % IV SOLN: INTRAVENOUS | Status: DC

## 2014-03-26 MED ORDER — HEPARIN (PORCINE) IN NACL 2-0.9 UNIT/ML-% IJ SOLN
INTRAMUSCULAR | Status: AC
  Filled 2014-03-26: qty 1000

## 2014-03-26 MED ORDER — NITROGLYCERIN 1 MG/10 ML FOR IR/CATH LAB
INTRA_ARTERIAL | Status: AC
  Filled 2014-03-26: qty 10

## 2014-03-26 MED ORDER — SODIUM CHLORIDE 0.9 % IJ SOLN: 3.0000 mL | Freq: Two times a day (BID) | INTRAMUSCULAR | Status: DC

## 2014-03-26 MED ORDER — ADENOSINE 12 MG/4ML IV SOLN
12.0000 mL | Freq: Once | INTRAVENOUS | Status: AC
  Administered 2014-03-26: 36 mg via INTRAVENOUS
  Filled 2014-03-26: qty 12

## 2014-03-26 MED ORDER — VERAPAMIL HCL 2.5 MG/ML IV SOLN
INTRAVENOUS | Status: AC
  Filled 2014-03-26: qty 2

## 2014-03-26 MED ORDER — SODIUM CHLORIDE 0.9 % IJ SOLN: 3.0000 mL | INTRAMUSCULAR | Status: DC | PRN

## 2014-03-26 MED ORDER — LABETALOL HCL 5 MG/ML IV SOLN: 20.0000 mg | INTRAVENOUS | Status: DC | PRN

## 2014-03-26 MED ORDER — SODIUM CHLORIDE 0.9 % IV SOLN: 250.0000 mL | INTRAVENOUS | Status: DC | PRN

## 2014-03-26 NOTE — CV Procedure (Signed)
Cardiac Catheterization Procedure Note  Name: [name] MRN: [id number] DOB: [birth year]  Procedure: Left Heart Cath, Selective Coronary Angiography, LV angiography, FFR and PTCA/stenting of the proximal/mid-LAD  Indication: CCS Class 3 angina. This is a 53 year old gentleman with history of CAD. He's had previous inferior wall infarction. He's undergone multiple PCI procedures of the right coronary artery. He presents with progressive anginal symptoms, now CCS class III. He has known moderate stenosis of the LAD which has been managed medically. He was referred for cardiac catheterization and possible PCI.   Procedural Details: The right wrist was prepped, draped, and anesthetized with 1% lidocaine. Using the modified Seldinger technique, a 5/6 French Slender sheath was introduced into the right radial artery. 3 mg of verapamil was administered through the sheath, weight-based unfractionated heparin was administered intravenously. Standard Judkins catheters were used for selective coronary angiography and left ventriculography. Catheter exchanges were performed over an exchange length guidewire. There were no immediate procedural complications. A TR band was used for radial hemostasis at the completion of the procedure.  The patient was transferred to the post catheterization recovery area for further monitoring.  Procedural Findings: Hemodynamics: AO 160/96 LV 166/20  Coronary angiography: Coronary dominance: right  Left mainstem: Arises from the left coronary cusp. No obstructive disease noted. Divides into the LAD and left circumflex.  Left anterior descending (LAD): The LAD is a large-caliber vessel. The vessel is diffusely diseased. The proximal segment has 70% stenosis, the mid segment has 75% stenosis, the second diagonal which is a large branch has 80% ostial stenosis. The apical LAD is patent  Left circumflex (LCx): Medium caliber vessel without significant stenosis. The  OM branches are patent.  Right coronary artery (RCA): Large, dominant vessel. The proximal and mid vessel are heavily stented. There is a double density of stent in the mid vessel where there were overlapping layers with DES inside of bare-metal stents. The entire RCA is patent. In the distal portion of the stent (mid RCA), there is 30-40% in-stent restenosis with some irregularity. The distal RCA just before the bifurcation of the PDA and PLA branches has 40% stenosis. The PDA and PLA branches have mild diffuse disease without high-grade stenosis.  Left ventriculography: The inferior wall is hypokinetic. There is a lot of ventricular ectopy making LVEF difficult to quantify. I would estimate the LVEF is about 45-50%.  Following the diagnostic procedure, elected to proceed with pressure wire analysis of the LAD. I felt the RCA stenosis was clearly nonobstructive. The LAD has moderate diffuse disease. A 5 [place] EBU guide catheter was utilized. The patient was given additional unfractionated heparin. A therapeutic ACT was achieved. A volcano pressure wire was advanced beyond the lesion. The resting FFR was 0.90. After one dose of intracoronary nitroglycerin, the FFR dropped to 0.70. I gave a second dose and this value was confirmed. A pullback gradient demonstrated that the lesion through the proximal and mid vessel account for the entirety of the abnormal FFR. I elected to pursue with PCI. The pressure wire was advanced into the apical portion of the LAD. The patient was loaded with Plavix 600 mg. The lesions were predilated with a 2.5 x 20 mm balloon. There was a localized balloon dissection in the midportion of the vessel. I was able to advance a 3.0 x 38 mm Xience DES so that the entire lesion was covered. The stent was deployed and appeared well-expanded. The stent was postdilated to the proximal and midportion with a 3.25 mm  noncompliant balloon. Following this balloon dilatation, the ostium of the second  diagonal branch appeared severely narrowed. The patient did have some mild chest pain at this time. I wired the diagonal branch through the stent struts with a cougar wire. The diagonal was dilated with a 2.5 x 12 mm balloon with a good result. There was about 30-40% residual stenosis, down from 95% stenosis originally. I felt the patient had received maximum benefit from his PCI procedure. Final angiography demonstrated an acceptable result. There was TIMI-3 flow and 0% residual stenosis in the LAD. There was TIMI-3 flow and 40% residual stenosis in the second diagonal.  Lesion 1: LAD/proximal TIMI flow 3 75% stenosis Stent: 3.0 x 38 mm Xience DES TIMI flow 3 post 0% residual stenosis  Lesion 2 Diag 2 95%, TIMI 3 pre Device: 2.5 mm balloon 40%, TIMI-3 post  Estimated Blood Loss: Minimal  Final Conclusions:   1. Continued patency of the RCA stent sites with mild in-stent restenosis 2. Moderately severe diffuse LAD stenosis with abnormal FFR, treated successfully with coronary stenting 3. No significant left circumflex stenosis 4. Mild segmental contraction abnormality the left ventricle with an estimated LVEF of 45-50%  Recommendations: Dual antiplatelet therapy with aspirin and Plavix for at least 12 months. Anticipate discharge home tomorrow.  [name] MD, [facility] 03/26/2014, 2:43 PM

## 2014-03-26 NOTE — Progress Notes (Signed)
TR BAND REMOVAL  LOCATION:    right radial  DEFLATED PER PROTOCOL:    Yes.    TIME BAND OFF / DRESSING APPLIED:    1600   SITE UPON ARRIVAL:    Level 0  SITE AFTER BAND REMOVAL:    Level 0  REVERSE ALLEN'S TEST:     positive  CIRCULATION SENSATION AND MOVEMENT:    Within Normal Limits   Yes.    COMMENTS:   Tolerated procedure well 

## 2014-03-26 NOTE — Care Management Note (Signed)
    Page 1 of 1   03/26/2014     4:01:06 PM CARE MANAGEMENT NOTE 03/26/2014  Patient:  [name], [name]   Account Number:  [id number]  Date Initiated:  03/26/2014  Documentation initiated by:  [facility]  Subjective/Objective Assessment:   53 yo male diagnosis of cp/cad  //Home alone.     Action/Plan:   Procedure(s):  LEFT HEART CATHETERIZATION WITH CORONARY ANGIOGRAM //Access for medication assistance.   Anticipated DC Date:  03/27/2014   Anticipated DC Plan:  HOME/SELF CARE  In-house referral  Financial Counselor      DC Planning Services  Medication Assistance      Choice offered to / List presented to:             Status of service:  In process, will continue to follow Medicare Important Message given?   (If response is "NO", the following Medicare IM given date fields will be blank) Date Medicare IM given:   Medicare IM given by:   Date Additional Medicare IM given:   Additional Medicare IM given by:    Discharge Disposition:    Per UR Regulation:  Reviewed for med. necessity/level of care/duration of stay  If discussed at [place] of Stay Meetings, dates discussed:    Comments:  03/26/14 [place], RN, BSN, [place] [phone] Noted NCM consult for medication assistance.  Will enroll pt in [facility] program and present him with letter/coupon prior to d/c home.
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
## 2014-03-26 NOTE — Interval H&P Note (Signed)
History and Physical Interval Note:  03/26/2014 10:27 AM  [name]  has presented today for surgery, with the diagnosis of cp/cad  The various methods of treatment have been discussed with the patient and family. After consideration of risks, benefits and other options for treatment, the patient has consented to  Procedure(s): LEFT HEART CATHETERIZATION WITH CORONARY ANGIOGRAM (N/A) as a surgical intervention .  The patient's history has been reviewed, patient examined, no change in status, stable for surgery.  I have reviewed the patient's chart and labs.  Questions were answered to the patient's satisfaction.    Cath Lab Visit (complete for each Cath Lab visit)  Clinical Evaluation Leading to the Procedure:   ACS: No.  Non-ACS:    Anginal Classification: CCS III  Anti-ischemic medical therapy: Minimal Therapy (1 class of medications)  Non-Invasive Test Results: Low-risk stress test findings: cardiac mortality <1%/year  Prior CABG: No previous CABG       [name]

## 2014-03-27 ENCOUNTER — Other Ambulatory Visit: Payer: Self-pay | Admitting: Physician Assistant

## 2014-03-27 DIAGNOSIS — R740 Nonspecific elevation of levels of transaminase and lactic acid dehydrogenase [LDH]: Principal | ICD-10-CM

## 2014-03-27 DIAGNOSIS — I209 Angina pectoris, unspecified: Secondary | ICD-10-CM

## 2014-03-27 DIAGNOSIS — R7401 Elevation of levels of liver transaminase levels: Secondary | ICD-10-CM

## 2014-03-27 LAB — CBC
HCT: 39 % (ref 39.0–52.0)
HEMOGLOBIN: 13.4 g/dL (ref 13.0–17.0)
MCH: 33.8 pg (ref 26.0–34.0)
MCHC: 34.4 g/dL (ref 30.0–36.0)
MCV: 98.5 fL (ref 78.0–100.0)
PLATELETS: 153 10*3/uL (ref 150–400)
RBC: 3.96 MIL/uL — AB (ref 4.22–5.81)
RDW: 12.7 % (ref 11.5–15.5)
WBC: 4.8 10*3/uL (ref 4.0–10.5)

## 2014-03-27 LAB — BASIC METABOLIC PANEL
Anion gap: 11 (ref 5–15)
BUN: 8 mg/dL (ref 6–23)
CO2: 23 mEq/L (ref 19–32)
Calcium: 8.7 mg/dL (ref 8.4–10.5)
Chloride: 107 mEq/L (ref 96–112)
Creatinine, Ser: 0.87 mg/dL (ref 0.50–1.35)
GFR calc non Af Amer: >90 mL/min (ref >90)
Glucose, Bld: 92 mg/dL (ref 70–99)
POTASSIUM: 3.9 meq/L (ref 3.7–5.3)
SODIUM: 141 meq/L (ref 137–147)

## 2014-03-27 MED ORDER — ATORVASTATIN CALCIUM 10 MG PO TABS: 10.0000 mg | ORAL_TABLET | Freq: Every day | ORAL | Status: DC

## 2014-03-27 MED ORDER — CLOPIDOGREL BISULFATE 75 MG PO TABS: 75.0000 mg | ORAL_TABLET | Freq: Every day | ORAL | Status: DC

## 2014-03-27 NOTE — Discharge Instructions (Signed)
Angina Pectoris Angina pectoris is extreme discomfort in your chest, neck, or arm. Your doctor may call it just angina. It is caused by a lack of oxygen to your heart wall. It may feel like tightness or heavy pressure. It may feel like a crushing or squeezing pain. Some people say it feels like gas. It may go down your shoulders, back, and arms. Some people have symptoms other than pain. These include:  Tiredness.  Shortness of breath.  Cold sweats.  Feeling sick to your stomach (nausea). There are four types of angina:  Stable angina. This type often lasts the same amount of time each time it happens. Activity, stress, or excitement can bring it on. It often gets better after taking a medicine called nitroglycerin. This goes under your tongue.  Unstable angina. This type can happen when you are not active or even during sleep. It can suddenly get worse or happen more often. It may not get better after taking the special medicine. It can last up to 30 minutes.  Microvascular angina. This type is more common in women. It may be more severe or last longer than other types.  Prinzmetal angina. This type often happens when you are not active or in the early morning hours. HOME CARE   Only take medicines as told by your doctor.  Stay active or exercise more as told by your doctor.  Limit very hard activity as told by your doctor.  Limit heavy lifting as told by your doctor.  Keep a healthy weight.  Learn about and eat foods that are healthy for your heart.  Do not use any tobacco such as cigarettes, chewing tobacco, or e-cigarettes. GET HELP RIGHT AWAY IF:   You have chest, neck, deep shoulder, or arm pain or discomfort that lasts more than a few minutes.  You have chest, neck, deep shoulder, or arm pain or discomfort that goes away and comes back over and over again.  You have heavy sweating that seems to happen for no reason.  You have shortness of breath or trouble  breathing.  Your angina does not get better after a few minutes of rest.  Your angina does not get better after you take nitroglycerin medicine. These can all be symptoms of a heart attack. Get help right away. Call your local emergency service (911 in [place] Do not  drive yourself to the hospital. Do not  wait to for your symptoms to go away. MAKE SURE YOU:   Understand these instructions.  Will watch your condition.  Will get help right away if you are not doing well or get worse. Document Released: 12/01/2007 Document Revised: 06/19/2013 Document Reviewed: 10/16/2013 [facility] Patient Information 2015 [place], [place]. This information is not intended to replace advice given to you by your health care provider. Make sure you discuss any questions you have with your health care provider.  No driving for 24 hours. No lifting over 5 lbs for 1 week. No sexual activity for 1 week. Keep procedure site clean & dry. If you notice increased pain, swelling, bleeding or pus, call/return!  You may shower, but no soaking baths/hot tubs/pools for 1 week.

## 2014-03-27 NOTE — Discharge Summary (Signed)
Discharge Summary   Patient ID: [name],  MRN: [id number], DOB/AGE: [id number] y.o.  Admit date: 03/26/2014 Discharge date: 03/27/2014  Primary Care Provider: Scarlette Calico Primary Cardiologist: Dr. [name]  Discharge Diagnoses Principal Problem:   Exertional angina  - Cath 03/26/2014 EF 45-50%, DES to prox LAD (due to abnormal FFR) and balloon angioplasty to D2, patent RCA stent with mild in-stent restenosis  Active Problems:   TOBACCO USE   Essential hypertension, benign   HYPERLIPIDEMIA   GERD   Rhabdomyolysis   Coronary artery disease   Elevated transaminase level  - Lipitor decreased from 80 mg down to 10mg , recheck LFT, Lipid and CK in 1 month   Allergies Allergies  Allergen Reactions  . Topamax     Loss of control of his muscles  . Celecoxib     REACTION: heartburn  . Codeine     REACTION: nausea  . Crestor [Rosuvastatin] Hives    Procedures  Cardiac catheterization Procedural Findings:  Hemodynamics:  AO 160/96  LV 166/20  Coronary angiography:  Coronary dominance: right  Left mainstem: Arises from the left coronary cusp. No obstructive disease noted. Divides into the LAD and left circumflex.  Left anterior descending (LAD): The LAD is a large-caliber vessel. The vessel is diffusely diseased. The proximal segment has 70% stenosis, the mid segment has 75% stenosis, the second diagonal which is a large branch has 80% ostial stenosis. The apical LAD is patent  Left circumflex (LCx): Medium caliber vessel without significant stenosis. The OM branches are patent.  Right coronary artery (RCA): Large, dominant vessel. The proximal and mid vessel are heavily stented. There is a double density of stent in the mid vessel where there were overlapping layers with DES inside of bare-metal stents. The entire RCA is patent. In the distal portion of the stent (mid RCA), there is 30-40% in-stent restenosis with some irregularity. The distal RCA just before the  bifurcation of the PDA and PLA branches has 40% stenosis. The PDA and PLA branches have mild diffuse disease without high-grade stenosis.  Left ventriculography: The inferior wall is hypokinetic. There is a lot of ventricular ectopy making LVEF difficult to quantify. I would estimate the LVEF is about 45-50%.  Lesion 1:  LAD/proximal  TIMI flow 3  75% stenosis  Stent: 3.0 x 38 mm Xience DES  TIMI flow 3 post  0% residual stenosis  Lesion 2  Diag 2  95%, TIMI 3 pre  Device: 2.5 mm balloon  40%, TIMI-3 post  Estimated Blood Loss: Minimal  Final Conclusions:  1. Continued patency of the RCA stent sites with mild in-stent restenosis  2. Moderately severe diffuse LAD stenosis with abnormal FFR, treated successfully with coronary stenting  3. No significant left circumflex stenosis  4. Mild segmental contraction abnormality the left ventricle with an estimated LVEF of 45-50%  Recommendations: Dual antiplatelet therapy with aspirin and Plavix for at least 12 months. Anticipate discharge home tomorrow.     Hospital Course  The patient is a 53 year old male with past medical history significant for coronary stenting 2003, acute inferior MI in February 4098 complicated with cardiogenic shock and V. fib status post 2 drug-eluting stents, hypertension, hyperlipidemia, history of a-fib, history of bipolar disorder, history of rhabdomyolysis in 2012 require admission. Patient was seen by Dr. [name] in the clinic on 03/25/2014 at which time, he complained of recurrent chest pain associated with shortness of breath. The characteristic of the chest pain was very similar to what he experienced  at the time of his previous MI although he does have some atypical features as well. The chest pain was both associated with rest and with activity. Patient was also noted to be under a lot of stress recently. Given his symptom and his past medical history, the plan was made to undergo cardiac catheterization to  definitively assess coronary anatomy.  Patient underwent cardiac catheterization on 03/26/2014 which showed EF 45-50%, abnormal FFR to proximal LAD lesion and treated with drug-eluting stent, balloon angioplasty treatment to D2. Previous RCA stent noted to have mild in-stent restenosis. After cardiac catheterization, he was started on aspirin and Plavix.  He was seen on the following morning on 03/27/2014, at which time he denies any significant chest discomfort or shortness breath. He does have intermittent sharp stabbing pain that is very transient and is not similar to his previous anginal symptom. He is deemed stable for discharge from cardiology perspective. He does complain of some lower extremity cramping. He was started on 80 mg Lipitor 2 months ago. After discussing with Dr. [name], we have decided to decrease his Lipitor down to 10 mg as he has some mildly elevated transaminase. I have scheduled office followup with Dr. Antionette Char PA in the clinic in 2-4 weeks. Patient will also need labs in 4 weeks to assess lipid panel, CK, and liver function test after decreasing Lipitor dosage.   Discharge Vitals Blood pressure 171/95, pulse 72, temperature 97.7 F (36.5 C), temperature source Oral, resp. rate 20, height 5\' 9"  (1.753 m), weight 152 lb 5.4 oz (69.1 kg), SpO2 97.00%.  Filed Weights   03/27/14 0026  Weight: 152 lb 5.4 oz (69.1 kg)    Labs  CBC  Recent Labs  03/25/14 1004 03/27/14 0240  WBC 6.0 4.8  HGB 15.5 13.4  HCT 44.9 39.0  MCV 101.0* 98.5  PLT 192.0 742   Basic Metabolic Panel  Recent Labs  03/25/14 1004 03/27/14 0240  NA 137 141  K 3.7 3.9  CL 103 107  CO2 28 23  GLUCOSE 102* 92  BUN 6 8  CREATININE 0.9 0.87  CALCIUM 9.3 8.7   Liver Function Tests  Recent Labs  03/25/14 1004  AST 57*  ALT 71*  ALKPHOS 96  BILITOT 0.8  PROT 7.8  ALBUMIN 4.4   Cardiac Enzymes  Recent Labs  03/25/14 1004  CKTOTAL 113   Fasting Lipid Panel  Recent Labs   03/25/14 1004  CHOL 192  HDL 60.00  TRIG 249.0*  CHOLHDL 3  LDLDIRECT 95.3    Disposition  Pt is being discharged home today in good condition.  Follow-up Plans & Appointments      Follow-up Information   Follow up with Richardson Dopp, PA-C On 04/19/2014. (9:50am)    Specialty:  Physician Assistant   Contact information:   [address] [address] [phone]       Follow up with [facility] Office On 04/26/2014. (Obtain labs during normal lab hour between 7:30am to 4:30pm. No food in the morning of lab. LFT, CK, and lipid)    Specialty:  Cardiology   Contact information:   [address] [place] [address] [phone]      Discharge Medications    Medication List         aspirin EC 81 MG tablet  Take 81 mg by mouth daily.     atorvastatin 10 MG tablet  Commonly known as:  LIPITOR  Take 1  tablet (10 mg total) by mouth daily.     clopidogrel 75 MG tablet  Commonly known as:  PLAVIX  Take 1 tablet (75 mg total) by mouth daily with breakfast.     metoprolol tartrate 25 MG tablet  Commonly known as:  LOPRESSOR  Take 25 mg by mouth 2 (two) times daily.     MULTIPLE VITAMIN PO  Take by mouth.     nitroGLYCERIN 0.4 MG SL tablet  Commonly known as:  NITROSTAT  Place 0.4 mg under the tongue every 5 (five) minutes as needed for chest pain.        Outstanding Labs/Studies  Repeat Lipid panel, LFT, and CK in 1 month as scheduled above  Duration of Discharge Encounter   Greater than 30 minutes including physician time.  Hilbert Corrigan PA-C Pager: [phone] 03/27/2014, 9:54 AM

## 2014-03-27 NOTE — Plan of Care (Signed)
Problem: Phase I Progression Outcomes Goal: Distal pulses assessed/charted Outcome: Progressing Radial pulses palpable. Site 0

## 2014-03-27 NOTE — Progress Notes (Signed)
Patient Name: [name] Date of Encounter: 03/27/2014     Active Problems:   Exertional angina    SUBJECTIVE  Occasional sharp transient chest discomfort, however different than his anginal CP. No SOB. Also started having bilateral LE leg cramp over night and bad headache.   CURRENT MEDS . aspirin EC  81 mg Oral Daily  . atorvastatin  80 mg Oral Daily  . clopidogrel  75 mg Oral Q breakfast  . Influenza vac split quadrivalent PF  0.5 mL Intramuscular Tomorrow-1000  . metoprolol tartrate  25 mg Oral BID    OBJECTIVE  Filed Vitals:   03/26/14 1728 03/26/14 2057 03/27/14 0026 03/27/14 0558  BP: 120/80 145/75 137/85 151/94  Pulse: 68 63 70 74  Temp: 98 F (36.7 C) 98 F (36.7 C) 97.6 F (36.4 C) 97.8 F (36.6 C)  TempSrc: Oral Oral Oral Oral  Resp: 16 18 18 20   Weight:   152 lb 5.4 oz (69.1 kg)   SpO2: 98% 98% 97% 97%    Intake/Output Summary (Last 24 hours) at 03/27/14 0723 Last data filed at 03/26/14 2124  Gross per 24 hour  Intake 1314.58 ml  Output    575 ml  Net 739.58 ml   Filed Weights   03/27/14 0026  Weight: 152 lb 5.4 oz (69.1 kg)    PHYSICAL EXAM  General: Pleasant, NAD. Neuro: Alert and oriented X 3. Moves all extremities spontaneously. Psych: Normal affect. HEENT:  Normal  Neck: Supple without bruits or JVD. Lungs:  Resp regular and unlabored, CTA. Heart: RRR no s3, s4, or murmurs. R radial cath site stable Abdomen: Soft, non-tender, non-distended, BS + x 4.  Extremities: No clubbing, cyanosis or edema. DP/PT/Radials 2+ and equal bilaterally.  Accessory Clinical Findings  CBC  Recent Labs  03/25/14 1004 03/27/14 0240  WBC 6.0 4.8  HGB 15.5 13.4  HCT 44.9 39.0  MCV 101.0* 98.5  PLT 192.0 425   Basic Metabolic Panel  Recent Labs  03/25/14 1004 03/27/14 0240  NA 137 141  K 3.7 3.9  CL 103 107  CO2 28 23  GLUCOSE 102* 92  BUN 6 8  CREATININE 0.9 0.87  CALCIUM 9.3 8.7   Liver Function Tests  Recent Labs  03/25/14 1004  AST 57*  ALT 71*  ALKPHOS 96  BILITOT 0.8  PROT 7.8  ALBUMIN 4.4   Cardiac Enzymes  Recent Labs  03/25/14 1004  CKTOTAL 113   Fasting Lipid Panel  Recent Labs  03/25/14 1004  CHOL 192  HDL 60.00  TRIG 249.0*  CHOLHDL 3  LDLDIRECT 95.3    TELE NSR with HR 70s, no signifiant ventricular ectopy    ECG  NSR with HR 50s, no significant ST-T wave changes  Echocardiogram 06/18/2011  LV EF: 50%  ------------------------------------------------------------ History: PMH: Pulmonary Edema. Coronary artery disease. PMH: Myocardial infarction. Risk factors: Hyperlipidemia. Current tobacco use. Hypertension.  ------------------------------------------------------------ Study Conclusions  - Left ventricle: Distal septal and inferobasal hypokinesis The cavity size was normal. Wall thickness was normal. The estimated ejection fraction was 50%. - Atrial septum: No defect or patent foramen ovale was identified. - Pulmonary arteries: PA peak pressure: 82mm Hg (S).     Radiology/Studies  No results found.  ASSESSMENT AND PLAN  1. Recurrent CP concerning for unstable angina  - cath 03/26/2014 EF 45-50%, DES to prox LAD (due to abnormal FFR) and balloon angioplasty to D2, patent RCA stent with mild in-stent restenosis  - Cotinue ASA and Plavix  for 12 month  2. CAD  - s/p coronary stenting 2003  - s/p 2 DES for acute inferior MI in Feb 4193 complicated with cardiogenic shock and v-fib  3. HLD 4. HTN 5. H/o Rhabdomyolysis: admitted in 2012  - intolerant to crestor, started having leg cramp 2 month after starting on lipitor  - consider decrease lipitor dose from 80 to 40mg .  6. H/o a-fib 7. H/o bipolar 1 disorder   Signed, Woodward Ku Pager: [phone]  Patient seen, examined. Available data reviewed. Agree with findings, assessment, and plan as outlined by Almyra Deforest, PA-C. Right radial site clear. Lungs CTA. Heart RRR without murmur. OK for  discharge home this am. He understands need for 12 months of ASA and plavix without interruption. Recommend decrease atorvastatin to 10 mg daily in setting elevated transaminases, hx rhabdo. Repeat lipids, LFT's, and CK in one month.  [name], M.D. 03/27/2014 8:00 AM

## 2014-03-27 NOTE — Plan of Care (Signed)
Problem: Phase II Progression Outcomes Goal: No post PCI angina Outcome: Progressing Pt has had no post PCI chest pain.

## 2014-03-27 NOTE — Progress Notes (Addendum)
CARDIAC REHAB PHASE I   PRE:  Rate/Rhythm: 72 SR    BP: sitting 171/95    SaO2:   MODE:  Ambulation: 1000 ft   POST:  Rate/Rhythm: 76 SR    BP: sitting 180/95     SaO2:   Tolerated very well. Sts his right calf pain was better with walking. Seems more like a cramp when he explains it to me. BP elevated. No CP. Very receptive to ed. Plans to quit smoking on patches. Interested in [place] and will send referral to [place]. Needs financial assist and gave application. Encouraged pt to decrease sugar drinks, etc to decrease triglycerides. [id number]    Darrick Meigs CES, ACSM 03/27/2014 9:17 AM

## 2014-04-19 ENCOUNTER — Encounter: Payer: Self-pay | Admitting: Physician Assistant

## 2014-04-19 ENCOUNTER — Ambulatory Visit (INDEPENDENT_AMBULATORY_CARE_PROVIDER_SITE_OTHER): Payer: Self-pay | Admitting: Physician Assistant

## 2014-04-19 ENCOUNTER — Other Ambulatory Visit (INDEPENDENT_AMBULATORY_CARE_PROVIDER_SITE_OTHER): Payer: Self-pay | Admitting: *Deleted

## 2014-04-19 VITALS — BP 160/90 | HR 76 | Ht 67.0 in | Wt 157.0 lb

## 2014-04-19 DIAGNOSIS — I10 Essential (primary) hypertension: Secondary | ICD-10-CM

## 2014-04-19 DIAGNOSIS — R740 Nonspecific elevation of levels of transaminase and lactic acid dehydrogenase [LDH]: Secondary | ICD-10-CM

## 2014-04-19 DIAGNOSIS — R079 Chest pain, unspecified: Secondary | ICD-10-CM

## 2014-04-19 DIAGNOSIS — Z23 Encounter for immunization: Secondary | ICD-10-CM

## 2014-04-19 DIAGNOSIS — E785 Hyperlipidemia, unspecified: Secondary | ICD-10-CM

## 2014-04-19 DIAGNOSIS — I2510 Atherosclerotic heart disease of native coronary artery without angina pectoris: Secondary | ICD-10-CM

## 2014-04-19 DIAGNOSIS — R7401 Elevation of levels of liver transaminase levels: Secondary | ICD-10-CM

## 2014-04-19 MED ORDER — AMLODIPINE BESYLATE 5 MG PO TABS: 5.0000 mg | ORAL_TABLET | Freq: Every day | ORAL | Status: DC

## 2014-04-19 MED ORDER — FAMOTIDINE 20 MG PO TABS: 20.0000 mg | ORAL_TABLET | Freq: Two times a day (BID) | ORAL | Status: DC
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
## 2014-04-19 NOTE — Patient Instructions (Signed)
Your physician recommends that you schedule a follow-up appointment for week after stress test with Dr. [name] or Richardson Dopp, PA on day Dr. [name] in office.    Your physician has requested that you have an exercise stress myoview. For further information please visit [email or web address]. Please follow instruction sheet, as given.   Lab work will be done today.   Your physician has recommended you make the following change in your medication:   Start amlodipine 5 mg by mouth daily. Start Pepcid 20 mg by mouth twice daily.

## 2014-04-24 ENCOUNTER — Encounter (HOSPITAL_COMMUNITY): Payer: Self-pay

## 2014-04-26 ENCOUNTER — Other Ambulatory Visit: Payer: Self-pay

## 2014-05-09 ENCOUNTER — Ambulatory Visit: Payer: Self-pay | Admitting: Physician Assistant

## 2014-05-15 ENCOUNTER — Ambulatory Visit (HOSPITAL_COMMUNITY): Payer: Self-pay | Attending: Internal Medicine | Admitting: Radiology

## 2014-05-15 ENCOUNTER — Telehealth: Payer: Self-pay | Admitting: Cardiovascular Disease

## 2014-05-15 DIAGNOSIS — I2510 Atherosclerotic heart disease of native coronary artery without angina pectoris: Secondary | ICD-10-CM

## 2014-05-15 DIAGNOSIS — I10 Essential (primary) hypertension: Secondary | ICD-10-CM | POA: Insufficient documentation

## 2014-05-15 DIAGNOSIS — R0602 Shortness of breath: Secondary | ICD-10-CM | POA: Insufficient documentation

## 2014-05-15 DIAGNOSIS — R079 Chest pain, unspecified: Secondary | ICD-10-CM

## 2014-05-15 MED ORDER — TECHNETIUM TC 99M SESTAMIBI GENERIC - CARDIOLITE
30.0000 | Freq: Once | INTRAVENOUS | Status: AC | PRN
  Administered 2014-05-15: 30 via INTRAVENOUS

## 2014-05-15 MED ORDER — TECHNETIUM TC 99M SESTAMIBI GENERIC - CARDIOLITE
10.0000 | Freq: Once | INTRAVENOUS | Status: AC | PRN
  Administered 2014-05-15: 10 via INTRAVENOUS

## 2014-05-15 NOTE — Telephone Encounter (Signed)
Not able to prescribe benzodiazepines. He will need to establish with a PCP.

## 2014-05-15 NOTE — Telephone Encounter (Signed)
The called in today asking if Dr. [name] could prescribe him something for anxiety. He states Dr. [name] prescribed him Xanax after his MI ~ 2012. I advised this should come from his PCP. He does not have a regular PCP now due to insurance. He is having chest pain for which he had a myoview today- results are pending. He feels like if he had something for anxiety this would help his symptoms. I advised I will forward to Dr. [name] for review and we will call back with recommendations as soon as possible. He is agreeable.

## 2014-05-15 NOTE — Telephone Encounter (Signed)
New message     Patient calling asking can he prescribe medication for anxiety attack. Patient feels his chest pain would stop.    Having chest pain on / off for several weeks.  Pain level 5.    NO PCP.

## 2014-05-15 NOTE — Progress Notes (Signed)
[facility] [place], [place] [address] [phone]    Cardiology Nuclear Med Study  [name] is a 53 y.o. male     MRN : [id number]     DOB: Nov 10, 1960  Procedure Date: 05/15/2014  Nuclear Med Background Indication for Stress Test:  Evaluation for Ischemia and Follow up [facility] 9/15 CP, Stent, PTCA History:  CAD, MPI 12/2013 EF 48%, hx afib Cardiac Risk Factors: Hypertension  Symptoms:  Chest Pain (last date of chest discomfort is currently a 5/10) and SOB   Nuclear Pre-Procedure Caffeine/Decaff Intake:  None NPO After: 6:30 pm   Lungs:  clear O2 Sat: 97% on room air. IV 0.9% NS with Angio Cath:  22g  IV Site: R Hand  IV Started by:  Crissie Figures, RN  Chest Size (in):  42 Cup Size: n/a  Height: 5\' 7"  (1.702 m)  Weight:  158 lb (71.668 kg)  BMI:  Body mass index is 24.74 kg/(m^2). Tech Comments: N/A    Nuclear Med Study 1 or 2 day study: 1 day  Stress Test Type:  Stress  Reading MD: N/A  Order Authorizing Provider:  [name], MD  Resting Radionuclide: Technetium 69m Sestamibi  Resting Radionuclide Dose: 11.0 mCi   Stress Radionuclide:  Technetium 60m Sestamibi  Stress Radionuclide Dose: 33.0 mCi           Stress Protocol Rest HR: 69 Stress HR: 148  Rest BP: 148/101 Stress BP: 204/96  Exercise Time (min): 6:00 METS: 7.0   Predicted Max HR: 165 bpm % Max HR: 89.7 bpm Rate Pressure Product: 30636   Dose of Adenosine (mg):  n/a Dose of Lexiscan: n/a mg  Dose of Atropine (mg): n/a Dose of Dobutamine: n/a mcg/kg/min (at max HR)  Stress Test Technologist: Glade Lloyd, BS-ES  Nuclear Technologist:  Earl Many, CNMT     Rest Procedure:  Myocardial perfusion imaging was performed at rest 45 minutes following the intravenous administration of Technetium 77m Sestamibi. Rest ECG: NSR - Normal EKG  Stress Procedure:  The patient exercised on the treadmill utilizing the Bruce Protocol for 6:00 minutes.  The patient stopped due to leg cramps and denied any chest pain. The patient came in with a 5/10 chest pain that did not intensify with exercise. Technetium 43m Sestamibi was injected at peak exercise and myocardial perfusion imaging was performed after a brief delay. Stress ECG: No significant change from baseline ECG  QPS Raw Data Images:  Normal; no motion artifact; normal heart/lung ratio. Stress Images:  There is decreased uptake in the inferior wall. Rest Images:  Comparison with the stress images reveals no significant change. Subtraction (SDS):  There is a fixed defect that is most consistent with a previous infarction. Transient Ischemic Dilatation (Normal <1.22):  1.07 Lung/Heart Ratio (Normal <0.45):  0.31  Quantitative Gated Spect Images QGS EDV:  104 ml QGS ESV:  63 ml  Impression Exercise Capacity:  Fair exercise capacity. BP Response:  Hypertensive blood pressure response. Clinical Symptoms:  No significant symptoms noted. ECG Impression:  No significant ST segment change suggestive of ischemia. Comparison with Prior Nuclear Study: no change in perfusion compared to July 2015, but LVEF has decreased from 48% to 40%.  Overall Impression:  Intermediate risk stress nuclear study with a moderate size old inferior wall infarction and moderately depressed LV systolic function..There is no apparent perfusion abnormality in the LAD artery territory.  LV Ejection Fraction: 40%.  LV Wall Motion:  inferior wall hypokinesis   Sanda Klein, MD, [facility] [phone] office [phone] pager

## 2014-05-16 ENCOUNTER — Ambulatory Visit (HOSPITAL_COMMUNITY): Payer: Self-pay

## 2014-05-16 ENCOUNTER — Encounter: Payer: Self-pay | Admitting: Physician Assistant

## 2014-05-16 ENCOUNTER — Telehealth: Payer: Self-pay | Admitting: *Deleted

## 2014-05-16 DIAGNOSIS — I2510 Atherosclerotic heart disease of native coronary artery without angina pectoris: Secondary | ICD-10-CM

## 2014-05-16 DIAGNOSIS — I10 Essential (primary) hypertension: Secondary | ICD-10-CM

## 2014-05-16 NOTE — Telephone Encounter (Signed)
pt notified about myoview results with verbal understanding. Pt aware needs echo to reassess EF from myoview. Echo scheduled for 05/22/14 1 pm.

## 2014-05-16 NOTE — Telephone Encounter (Signed)
pt notified about myoview and the need to do echo to reassess EF from [facility]. Echo scheduled for 11/25 1 pm.

## 2014-05-16 NOTE — Telephone Encounter (Signed)
Lauren- Dr. [name] still hasn't reviewed the patient's myoview. It is in his basket to review. I thought he could be called about this message when he is notified of his myoview results.

## 2014-05-20 ENCOUNTER — Ambulatory Visit (HOSPITAL_COMMUNITY): Payer: Self-pay

## 2014-05-22 ENCOUNTER — Ambulatory Visit (HOSPITAL_COMMUNITY): Payer: Self-pay | Attending: Cardiology

## 2014-05-22 ENCOUNTER — Ambulatory Visit (HOSPITAL_COMMUNITY): Payer: Self-pay

## 2014-05-22 DIAGNOSIS — L0390 Cellulitis, unspecified: Secondary | ICD-10-CM | POA: Insufficient documentation

## 2014-05-22 DIAGNOSIS — R079 Chest pain, unspecified: Secondary | ICD-10-CM | POA: Insufficient documentation

## 2014-05-22 DIAGNOSIS — F410 Panic disorder [episodic paroxysmal anxiety] without agoraphobia: Secondary | ICD-10-CM | POA: Insufficient documentation

## 2014-05-22 DIAGNOSIS — I10 Essential (primary) hypertension: Secondary | ICD-10-CM | POA: Insufficient documentation

## 2014-05-22 DIAGNOSIS — R300 Dysuria: Secondary | ICD-10-CM | POA: Insufficient documentation

## 2014-05-22 DIAGNOSIS — E785 Hyperlipidemia, unspecified: Secondary | ICD-10-CM | POA: Insufficient documentation

## 2014-05-22 DIAGNOSIS — I739 Peripheral vascular disease, unspecified: Secondary | ICD-10-CM | POA: Insufficient documentation

## 2014-05-22 DIAGNOSIS — F419 Anxiety disorder, unspecified: Secondary | ICD-10-CM | POA: Insufficient documentation

## 2014-05-22 DIAGNOSIS — F329 Major depressive disorder, single episode, unspecified: Secondary | ICD-10-CM | POA: Insufficient documentation

## 2014-05-22 DIAGNOSIS — M6282 Rhabdomyolysis: Secondary | ICD-10-CM | POA: Insufficient documentation

## 2014-05-22 DIAGNOSIS — I2510 Atherosclerotic heart disease of native coronary artery without angina pectoris: Secondary | ICD-10-CM

## 2014-05-22 DIAGNOSIS — D649 Anemia, unspecified: Secondary | ICD-10-CM | POA: Insufficient documentation

## 2014-05-22 DIAGNOSIS — I340 Nonrheumatic mitral (valve) insufficiency: Secondary | ICD-10-CM | POA: Insufficient documentation

## 2014-05-22 DIAGNOSIS — F1910 Other psychoactive substance abuse, uncomplicated: Secondary | ICD-10-CM | POA: Insufficient documentation

## 2014-05-22 DIAGNOSIS — Z720 Tobacco use: Secondary | ICD-10-CM | POA: Insufficient documentation

## 2014-05-22 DIAGNOSIS — I252 Old myocardial infarction: Secondary | ICD-10-CM | POA: Insufficient documentation

## 2014-05-22 DIAGNOSIS — K219 Gastro-esophageal reflux disease without esophagitis: Secondary | ICD-10-CM | POA: Insufficient documentation

## 2014-05-22 NOTE — Telephone Encounter (Signed)
Follow up ° ° ° ° ° °Returning [name]'s call °

## 2014-05-22 NOTE — Progress Notes (Signed)
2D Echo completed. 05/22/2014

## 2014-05-22 NOTE — Telephone Encounter (Signed)
Pt has already been notified about myoview results.  I left a message for the pt to call our office to discuss his request for anxiety medication.

## 2014-05-22 NOTE — Telephone Encounter (Signed)
I spoke with the pt and made him aware that he needs to re-establish with his PCP to evaluate and treat his anxiety.

## 2014-05-24 ENCOUNTER — Telehealth: Payer: Self-pay | Admitting: *Deleted

## 2014-05-24 ENCOUNTER — Encounter: Payer: Self-pay | Admitting: Physician Assistant

## 2014-05-24 NOTE — Telephone Encounter (Signed)
pt notified about echo results and recommendations with verbal understanding.

## 2014-05-27 ENCOUNTER — Ambulatory Visit (HOSPITAL_COMMUNITY): Payer: Self-pay

## 2014-05-29 ENCOUNTER — Ambulatory Visit (HOSPITAL_COMMUNITY): Payer: Self-pay

## 2014-05-31 ENCOUNTER — Ambulatory Visit (HOSPITAL_COMMUNITY): Payer: Self-pay

## 2014-06-03 ENCOUNTER — Ambulatory Visit (HOSPITAL_COMMUNITY): Payer: Self-pay

## 2014-06-05 ENCOUNTER — Encounter: Payer: Self-pay | Admitting: Physician Assistant

## 2014-06-05 ENCOUNTER — Ambulatory Visit (HOSPITAL_COMMUNITY): Payer: Self-pay
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
## 2014-06-05 NOTE — Progress Notes (Signed)
This encounter was created in error - please disregard.

## 2014-06-06 ENCOUNTER — Encounter (HOSPITAL_COMMUNITY): Payer: Self-pay | Admitting: Cardiovascular Disease

## 2014-06-07 ENCOUNTER — Ambulatory Visit (HOSPITAL_COMMUNITY): Payer: Self-pay

## 2014-06-10 ENCOUNTER — Ambulatory Visit (HOSPITAL_COMMUNITY): Payer: Self-pay

## 2014-06-12 ENCOUNTER — Ambulatory Visit (HOSPITAL_COMMUNITY): Payer: Self-pay

## 2014-06-14 ENCOUNTER — Ambulatory Visit (HOSPITAL_COMMUNITY): Payer: Self-pay

## 2014-06-17 ENCOUNTER — Ambulatory Visit (HOSPITAL_COMMUNITY): Payer: Self-pay

## 2014-06-19 ENCOUNTER — Ambulatory Visit (HOSPITAL_COMMUNITY): Payer: Self-pay

## 2014-06-24 ENCOUNTER — Ambulatory Visit (HOSPITAL_COMMUNITY): Payer: Self-pay

## 2014-06-26 ENCOUNTER — Ambulatory Visit (HOSPITAL_COMMUNITY): Payer: Self-pay

## 2014-07-01 ENCOUNTER — Ambulatory Visit (HOSPITAL_COMMUNITY): Payer: Self-pay

## 2014-07-03 ENCOUNTER — Ambulatory Visit (HOSPITAL_COMMUNITY): Payer: Self-pay

## 2014-07-05 ENCOUNTER — Ambulatory Visit (HOSPITAL_COMMUNITY): Payer: Self-pay

## 2014-07-08 ENCOUNTER — Ambulatory Visit (HOSPITAL_COMMUNITY): Payer: Self-pay

## 2014-07-10 ENCOUNTER — Ambulatory Visit (HOSPITAL_COMMUNITY): Payer: Self-pay

## 2014-07-12 ENCOUNTER — Ambulatory Visit (HOSPITAL_COMMUNITY): Payer: Self-pay

## 2014-07-15 ENCOUNTER — Ambulatory Visit (HOSPITAL_COMMUNITY): Payer: Self-pay

## 2014-07-17 ENCOUNTER — Ambulatory Visit (HOSPITAL_COMMUNITY): Payer: Self-pay

## 2014-07-19 ENCOUNTER — Ambulatory Visit (HOSPITAL_COMMUNITY): Payer: Self-pay

## 2014-07-22 ENCOUNTER — Ambulatory Visit (HOSPITAL_COMMUNITY): Payer: Self-pay

## 2014-07-24 ENCOUNTER — Ambulatory Visit (HOSPITAL_COMMUNITY): Payer: Self-pay

## 2014-07-26 ENCOUNTER — Ambulatory Visit (HOSPITAL_COMMUNITY): Payer: Self-pay

## 2014-07-29 ENCOUNTER — Ambulatory Visit (HOSPITAL_COMMUNITY): Payer: Self-pay

## 2014-07-31 ENCOUNTER — Ambulatory Visit (HOSPITAL_COMMUNITY): Payer: Self-pay

## 2014-08-02 ENCOUNTER — Ambulatory Visit (HOSPITAL_COMMUNITY): Payer: Self-pay

## 2014-08-05 ENCOUNTER — Ambulatory Visit (HOSPITAL_COMMUNITY): Payer: Self-pay

## 2014-08-07 ENCOUNTER — Ambulatory Visit (HOSPITAL_COMMUNITY): Payer: Self-pay

## 2014-08-09 ENCOUNTER — Ambulatory Visit (HOSPITAL_COMMUNITY): Payer: Self-pay

## 2014-08-12 ENCOUNTER — Ambulatory Visit (HOSPITAL_COMMUNITY): Payer: Self-pay

## 2014-08-14 ENCOUNTER — Ambulatory Visit (HOSPITAL_COMMUNITY): Payer: Self-pay

## 2014-08-16 ENCOUNTER — Ambulatory Visit (HOSPITAL_COMMUNITY): Payer: Self-pay

## 2014-08-19 ENCOUNTER — Ambulatory Visit (HOSPITAL_COMMUNITY): Payer: Self-pay

## 2014-08-21 ENCOUNTER — Ambulatory Visit (HOSPITAL_COMMUNITY): Payer: Self-pay

## 2014-10-04 ENCOUNTER — Telehealth: Payer: Self-pay | Admitting: Cardiovascular Disease

## 2014-10-04 NOTE — Telephone Encounter (Signed)
Noted. thx 

## 2014-10-04 NOTE — Telephone Encounter (Signed)
Patient verbalized concern over intermittent/worsening pinching CP, SOB, left arm tingling over past month. Stated he had hx of MI, stent, anxiety and depression. Has "serious worries" regarding bad debt balance at hospital and with Dr. [name]. States he is unsure what to do because he doesn't want to be in debt, but needs cardiac care. Patient is not working at this time. He has very pronounced slow speech that is flat and without inflections. He states he is depressed and cries frequently. Stated he saw a psychiatrist for a while but can't afford the visits. Provided information to patient that he should definitely proceed to ED immediately due to CP, SOB, left arm tingling and symptomatic depression/anxiety. Informed him that they can not turn him away or deny him and assessment and needed emergency treatment. Repeated this to him several times. He declines to go to ED at this time. Scheduled him with Richardson Dopp, PA, for Monday, 4/11 @ 3:40 pm. Provided patient with phone number and contact name of Clarice Pole that handles financial assistance for indigent/financially unable pts. Clarice Pole [phone]. Patient stated he would call her and he would keep his appointment on 4/11 with Richardson Dopp. He denies feelings of hurting himself. States he just needs to cry a little and calm down. He states he feels better knowing he can see Richardson Dopp on Monday. Nurse reiterated that patient really should go to ED ASAP to be checked out. Patient indicated he would think about it should he feel worse. Patient voiced that he was appreciative for the assistance, the contact number and the appointment.

## 2014-10-04 NOTE — Telephone Encounter (Signed)
Per pt    Having chest pain for the pass 4 wk was worried about money.   SOB, CP and tingling in left arm. goning on now.

## 2014-10-04 NOTE — Telephone Encounter (Signed)
Cp pain current but Pt continues to talk about money concerns throughout the conversation. States hospital calls daily to talk about payments. States off and on cp for 2 months but is more consistent now, SOB, pain in left arm. Pt fears its anxiety but states it is the same pain he had when he had his stent placed. Pt was told to chew an aspirin and call 911 several times. Pt wants to be seen in office. I continued to listen to his concerns but emphasized the need to be seen in ED. Pt ended the call by stating he feels upset and is going to hang up now. I will forward to Dr/Nurse to update.

## 2014-10-07 ENCOUNTER — Ambulatory Visit: Payer: Self-pay | Admitting: Physician Assistant

## 2014-10-20 NOTE — Op Note (Signed)
PATIENT NAME:  [name], [name] MR#:  [id number] DATE OF BIRTH:  Nov 28, 1960  DATE OF PROCEDURE:  11/02/2011  PREOPERATIVE DIAGNOSIS: Right rotator cuff tear, massive.   POSTOPERATIVE DIAGNOSIS: Right rotator cuff tear, massive.   PROCEDURE: Right rotator cuff repair.   SURGEON: Laurene Footman, MD  ANESTHESIA: General.    DESCRIPTION OF PROCEDURE: Patient brought to the Operating Room and after adequate anesthesia was obtained, a bump was placed underneath the shoulder blade and patient was placed in semirecumbent position, prepped and draped in usual sterile fashion, appropriate patient identification and timeout procedure were completed. A saber incision was made over the distal acromion and a deltoid on approach made to enter the subacromial space. There was a massive cuff tear with retraction. Anteriorly there was a suture noted into the more anterior portion of the cuff from an old suture anchor from prior repair. This had failed and this suture was removed. The cuff was gently mobilized. Essentially the entire cuff had torn. Sutures were placed with three sutures being placed and the tendon brought distally, approximately 2 cm from its anatomic attachment, could not be mobilized further than this. The bony surfaces were roughened and then three suture anchors were placed tightening the tendon down to this level and there appeared to be stable fixation of the cuff after placing the shoulder through a range of motion. The wound was thoroughly irrigated. Prior decompression of this was not required. The wound was closed using #1 Vicryl for the deltoid, 2-0 Vicryl subcutaneously and 4-0 nylon for the skin. 30 mL of 0.5% Sensorcaine with epinephrine was infiltrated for postoperative analgesia.   ESTIMATED BLOOD LOSS: Minimal.   COMPLICATIONS: None.   SPECIMENS: None.   IMPLANTS: Speed screw anchor x3.   ____________________________ Laurene Footman, MD mjm:cms D: 11/02/2011 19:08:21  ET T: 11/03/2011 10:54:41 ET JOB#: [id number]  cc: Laurene Footman, MD, <Dictator> Laurene Footman MD ELECTRONICALLY SIGNED 11/03/2011 12:08

## 2015-01-04 ENCOUNTER — Other Ambulatory Visit: Payer: Self-pay | Admitting: Physician Assistant
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
## 2015-03-06 ENCOUNTER — Ambulatory Visit (INDEPENDENT_AMBULATORY_CARE_PROVIDER_SITE_OTHER): Payer: Self-pay | Admitting: Physician Assistant

## 2015-03-06 ENCOUNTER — Encounter: Payer: Self-pay | Admitting: Physician Assistant

## 2015-03-06 VITALS — BP 160/80 | HR 75 | Ht 66.0 in | Wt 156.0 lb

## 2015-03-06 DIAGNOSIS — Z720 Tobacco use: Secondary | ICD-10-CM

## 2015-03-06 DIAGNOSIS — I2510 Atherosclerotic heart disease of native coronary artery without angina pectoris: Secondary | ICD-10-CM

## 2015-03-06 DIAGNOSIS — E785 Hyperlipidemia, unspecified: Secondary | ICD-10-CM

## 2015-03-06 DIAGNOSIS — R0789 Other chest pain: Secondary | ICD-10-CM

## 2015-03-06 DIAGNOSIS — I10 Essential (primary) hypertension: Secondary | ICD-10-CM

## 2015-03-06 DIAGNOSIS — I255 Ischemic cardiomyopathy: Secondary | ICD-10-CM

## 2015-03-06 DIAGNOSIS — K219 Gastro-esophageal reflux disease without esophagitis: Secondary | ICD-10-CM

## 2015-03-06 MED ORDER — PRAVASTATIN SODIUM 40 MG PO TABS: 40.0000 mg | ORAL_TABLET | Freq: Every evening | ORAL | Status: DC

## 2015-03-06 MED ORDER — LISINOPRIL 10 MG PO TABS: 10.0000 mg | ORAL_TABLET | Freq: Every day | ORAL | Status: DC

## 2015-03-06 MED ORDER — PANTOPRAZOLE SODIUM 40 MG PO TBEC: 40.0000 mg | DELAYED_RELEASE_TABLET | Freq: Every day | ORAL | Status: DC

## 2015-03-06 MED ORDER — METOPROLOL TARTRATE 25 MG PO TABS: 25.0000 mg | ORAL_TABLET | Freq: Two times a day (BID) | ORAL | Status: DC

## 2015-03-06 MED ORDER — CLOPIDOGREL BISULFATE 75 MG PO TABS: 75.0000 mg | ORAL_TABLET | Freq: Every day | ORAL | Status: DC

## 2015-03-06 MED ORDER — AMLODIPINE BESYLATE 5 MG PO TABS: 5.0000 mg | ORAL_TABLET | Freq: Every day | ORAL | Status: DC

## 2015-03-06 MED ORDER — NITROGLYCERIN 0.4 MG SL SUBL: 0.4000 mg | SUBLINGUAL_TABLET | SUBLINGUAL | Status: DC | PRN

## 2015-03-06 NOTE — Patient Instructions (Addendum)
Medication Instructions:  Your physician has recommended you make the following change in your medication:  1. Discontinue Lipitor 2. Discontinue Prilosec 3. Start Pravastatin ( 40 mg) every night - start two weeks after discontinued lipitor 4. Start Lisinopril ( 10 mg ) daily 5. Start Protonix ( 40 mg ) daily Norvasc, plavix, metoprolol, and nitroglycerin were all refilled at your office visit today to [facility] on [address]   Labwork: Your physician recommends that you return for a FASTING lipid profile: December 9 between 8-5 just walk in, no eating Your physician recommends that you return for lab work on September 15 between 8-5 just walk in   Testing/Procedures: -None  Follow-Up: Your physician recommends that you schedule a follow-up appointment with Dr. [name] in 2-3 months, Theodosia Quay, RN, Dr. Antionette Char nurse will call you with appointment.    Any Other Special Instructions Will Be Listed Below (If Applicable).  If indigestion continues follow up with PCP.

## 2015-03-13 ENCOUNTER — Other Ambulatory Visit: Payer: Self-pay

## 2015-05-01 ENCOUNTER — Encounter: Payer: Self-pay | Admitting: Gastroenterology

## 2015-06-03 ENCOUNTER — Encounter: Payer: Self-pay | Admitting: Cardiovascular Disease

## 2015-06-03 ENCOUNTER — Ambulatory Visit (INDEPENDENT_AMBULATORY_CARE_PROVIDER_SITE_OTHER): Payer: Medicaid Other | Admitting: Cardiovascular Disease

## 2015-06-03 VITALS — BP 140/92 | HR 106 | Ht 66.0 in | Wt 154.0 lb

## 2015-06-03 DIAGNOSIS — I10 Essential (primary) hypertension: Secondary | ICD-10-CM

## 2015-06-03 DIAGNOSIS — I25119 Atherosclerotic heart disease of native coronary artery with unspecified angina pectoris: Secondary | ICD-10-CM | POA: Diagnosis not present

## 2015-06-03 DIAGNOSIS — E785 Hyperlipidemia, unspecified: Secondary | ICD-10-CM

## 2015-06-03 LAB — COMPREHENSIVE METABOLIC PANEL
ALBUMIN: 4.2 g/dL (ref 3.6–5.1)
ALK PHOS: 94 U/L (ref 40–115)
ALT: 27 U/L (ref 9–46)
AST: 24 U/L (ref 10–35)
BUN: 5 mg/dL — ABNORMAL LOW (ref 7–25)
CHLORIDE: 104 mmol/L (ref 98–110)
CO2: 25 mmol/L (ref 20–31)
CREATININE: 0.87 mg/dL (ref 0.70–1.33)
Calcium: 9.5 mg/dL (ref 8.6–10.3)
Glucose, Bld: 96 mg/dL (ref 65–99)
Potassium: 3.8 mmol/L (ref 3.5–5.3)
SODIUM: 139 mmol/L (ref 135–146)
Total Bilirubin: 0.6 mg/dL (ref 0.2–1.2)
Total Protein: 7.6 g/dL (ref 6.1–8.1)

## 2015-06-03 LAB — LIPID PANEL
CHOLESTEROL: 218 mg/dL — AB (ref 125–200)
HDL: 46 mg/dL (ref >=40)
LDL Cholesterol: 135 mg/dL — ABNORMAL HIGH (ref <130)
Total CHOL/HDL Ratio: 4.7 Ratio (ref <=5.0)
Triglycerides: 184 mg/dL — ABNORMAL HIGH (ref <150)
VLDL: 37 mg/dL — AB (ref <30)

## 2015-06-03 MED ORDER — METOPROLOL TARTRATE 50 MG PO TABS: 50.0000 mg | ORAL_TABLET | Freq: Two times a day (BID) | ORAL | Status: DC

## 2015-06-03 NOTE — Patient Instructions (Signed)
Medication Instructions:  Your physician has recommended you make the following change in your medication:  1. INCREASE Metoprolol Tartrate to 50mg  take one tablet by mouth twice a day  Labwork: Your physician recommends that you have lab work today: CMP and LIPID  Testing/Procedures: No new orders.   Follow-Up: Your physician recommends that you schedule a follow-up appointment in: 1 MONTH with Richardson Dopp PA-C   Any Other Special Instructions Will Be Listed Below (If Applicable).     If you need a refill on your cardiac medications before your next appointment, please call your pharmacy.
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
## 2015-06-05 ENCOUNTER — Other Ambulatory Visit: Payer: Self-pay | Admitting: *Deleted

## 2015-06-05 DIAGNOSIS — E785 Hyperlipidemia, unspecified: Secondary | ICD-10-CM

## 2015-06-05 MED ORDER — ATORVASTATIN CALCIUM 40 MG PO TABS: 40.0000 mg | ORAL_TABLET | Freq: Every day | ORAL | Status: DC

## 2015-06-06 ENCOUNTER — Other Ambulatory Visit: Payer: Self-pay
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
## 2015-07-04 ENCOUNTER — Encounter: Payer: Medicaid Other | Admitting: Physician Assistant

## 2015-07-08 ENCOUNTER — Ambulatory Visit: Payer: Medicaid Other | Admitting: Physician Assistant

## 2015-07-31 ENCOUNTER — Other Ambulatory Visit (INDEPENDENT_AMBULATORY_CARE_PROVIDER_SITE_OTHER): Payer: Medicaid Other | Admitting: *Deleted

## 2015-07-31 DIAGNOSIS — E785 Hyperlipidemia, unspecified: Secondary | ICD-10-CM

## 2015-07-31 LAB — LIPID PANEL
Cholesterol: 252 mg/dL — ABNORMAL HIGH (ref 125–200)
HDL: 42 mg/dL (ref >=40)
LDL Cholesterol: 176 mg/dL — ABNORMAL HIGH (ref <130)
Total CHOL/HDL Ratio: 6 Ratio — ABNORMAL HIGH (ref <=5.0)
Triglycerides: 172 mg/dL — ABNORMAL HIGH (ref <150)
VLDL: 34 mg/dL — ABNORMAL HIGH (ref <30)

## 2015-07-31 LAB — HEPATIC FUNCTION PANEL
ALT: 26 U/L (ref 9–46)
AST: 25 U/L (ref 10–35)
Albumin: 4.6 g/dL (ref 3.6–5.1)
Alkaline Phosphatase: 102 U/L (ref 40–115)
BILIRUBIN DIRECT: 0.1 mg/dL (ref <=0.2)
BILIRUBIN INDIRECT: 0.8 mg/dL (ref 0.2–1.2)
Total Bilirubin: 0.9 mg/dL (ref 0.2–1.2)
Total Protein: 7.7 g/dL (ref 6.1–8.1)

## 2015-08-26 ENCOUNTER — Ambulatory Visit (INDEPENDENT_AMBULATORY_CARE_PROVIDER_SITE_OTHER): Payer: Medicaid Other | Admitting: Pharmacist

## 2015-08-26 DIAGNOSIS — F17200 Nicotine dependence, unspecified, uncomplicated: Secondary | ICD-10-CM | POA: Diagnosis not present

## 2015-08-26 DIAGNOSIS — E785 Hyperlipidemia, unspecified: Secondary | ICD-10-CM | POA: Diagnosis not present

## 2015-08-26 MED ORDER — NICOTINE 14 MG/24HR TD PT24: 14.0000 mg | MEDICATED_PATCH | Freq: Every day | TRANSDERMAL | Status: DC

## 2015-08-26 MED ORDER — NICOTINE POLACRILEX 4 MG MT GUM: 4.0000 mg | CHEWING_GUM | OROMUCOSAL | Status: DC | PRN

## 2015-08-26 NOTE — Patient Instructions (Signed)
Stop taking your atorvastatin (Lipitor). We will start the paperwork for Praluent injections and follow up with you within the next few weeks.  Pick up your prescriptions for nicotine gum and the nicotine patch to help you quit smoking. Chew 1 piece of gum whenever you have a craving to smoke using the park and chew method. Apply 1 patch every morning to a dry, hairless area of your body and rotate sites. If you have trouble sleeping or have vivid dreams, you can remove the patch before bedtime.  Call Jinny Blossom in the pharmacy clinic with any questions [phone]
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
## 2015-09-01 ENCOUNTER — Telehealth: Payer: Self-pay | Admitting: Pharmacist

## 2015-09-01 NOTE — Telephone Encounter (Signed)
New message      Pt states someone was calling ins to see which cholesterol medication his ins would pay for.  He has a new phone number and is afraid the pharmacist could not get in touch with him.  His new number is [phone].

## 2015-09-01 NOTE — Telephone Encounter (Signed)
Returned patient's call - had tried to contact him previously to let him know that NRT patch and gum were ready at his pharmacy. Still awaiting response from his insurance on Praluent.

## 2015-09-10 ENCOUNTER — Telehealth: Payer: Self-pay | Admitting: Pharmacist

## 2015-09-10 MED ORDER — ALIROCUMAB 75 MG/ML ~~LOC~~ SOPN: 1.0000 "pen " | PEN_INJECTOR | SUBCUTANEOUS | Status: DC

## 2015-09-10 NOTE — Telephone Encounter (Signed)
Called [employer] - Praluent was approved by Medicaid on 3/1. Rx sent to [facility] (no specialty pharmacy preferred with Medicaid). LMOM for pt that medication was approved and for him to contact us once he receives his first shipment.

## 2015-09-25 ENCOUNTER — Telehealth: Payer: Self-pay | Admitting: Pharmacist

## 2015-09-25 MED ORDER — ALIROCUMAB 75 MG/ML ~~LOC~~ SOPN: 1.0000 "pen " | PEN_INJECTOR | SUBCUTANEOUS | Status: DC

## 2015-09-25 NOTE — Telephone Encounter (Signed)
Called pt to see if he had received his first delivery of Praluent yet. He reported that he heard from [place] who asked for his insurance information. Should not have been needed since Praluent was approved for pt through Medicaid with $3 copay a few weeks ago.  Called [facility] to follow up, apparently they voided out the prescription and the representative could not tell me why. Sending in rx again. Pt aware.

## 2015-12-11 ENCOUNTER — Telehealth: Payer: Self-pay | Admitting: Pharmacist

## 2015-12-11 NOTE — Telephone Encounter (Signed)
Spoke with pt to f/u with Praluent tolerability. Pt reports no issues. Lipid panel checked at PCP visit last month, LDL down from 176 to 33. Excellent reduction on Praluent. No further medication changes made at this time.

## 2015-12-11 NOTE — Telephone Encounter (Signed)
New message      Returning a call to [facility]

## 2016-01-13 ENCOUNTER — Other Ambulatory Visit: Payer: Self-pay | Admitting: Physician Assistant

## 2016-01-13 ENCOUNTER — Other Ambulatory Visit: Payer: Self-pay | Admitting: Cardiovascular Disease
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
## 2016-02-16 ENCOUNTER — Ambulatory Visit: Payer: Medicaid Other | Admitting: Physician Assistant

## 2016-03-03 ENCOUNTER — Encounter: Payer: Self-pay | Admitting: Physician Assistant

## 2016-04-12 ENCOUNTER — Ambulatory Visit: Payer: Medicaid Other | Admitting: Physician Assistant
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
## 2016-04-14 ENCOUNTER — Encounter: Payer: Self-pay | Admitting: Physician Assistant

## 2016-06-14 ENCOUNTER — Encounter: Payer: Self-pay | Admitting: Gastroenterology

## 2016-07-13 ENCOUNTER — Telehealth: Payer: Self-pay | Admitting: Physician Assistant

## 2016-07-13 ENCOUNTER — Ambulatory Visit (INDEPENDENT_AMBULATORY_CARE_PROVIDER_SITE_OTHER): Payer: Medicare Other | Admitting: Physician Assistant

## 2016-07-13 ENCOUNTER — Encounter: Payer: Self-pay | Admitting: Physician Assistant

## 2016-07-13 VITALS — BP 144/80 | HR 93 | Ht 66.0 in | Wt 155.4 lb

## 2016-07-13 DIAGNOSIS — I25119 Atherosclerotic heart disease of native coronary artery with unspecified angina pectoris: Secondary | ICD-10-CM | POA: Diagnosis not present

## 2016-07-13 DIAGNOSIS — E7800 Pure hypercholesterolemia, unspecified: Secondary | ICD-10-CM

## 2016-07-13 DIAGNOSIS — Z720 Tobacco use: Secondary | ICD-10-CM

## 2016-07-13 DIAGNOSIS — I209 Angina pectoris, unspecified: Secondary | ICD-10-CM

## 2016-07-13 DIAGNOSIS — R252 Cramp and spasm: Secondary | ICD-10-CM | POA: Diagnosis not present

## 2016-07-13 DIAGNOSIS — I10 Essential (primary) hypertension: Secondary | ICD-10-CM

## 2016-07-13 MED ORDER — CLOPIDOGREL BISULFATE 75 MG PO TABS: ORAL_TABLET | ORAL | 3 refills | Status: DC

## 2016-07-13 MED ORDER — METOPROLOL TARTRATE 50 MG PO TABS: 75.0000 mg | ORAL_TABLET | Freq: Two times a day (BID) | ORAL | 3 refills | Status: DC

## 2016-07-13 MED ORDER — AMLODIPINE BESYLATE 5 MG PO TABS: 5.0000 mg | ORAL_TABLET | Freq: Every day | ORAL | 3 refills | Status: DC

## 2016-07-13 MED ORDER — LISINOPRIL 10 MG PO TABS: 10.0000 mg | ORAL_TABLET | Freq: Every day | ORAL | 3 refills | Status: DC

## 2016-07-13 MED ORDER — NITROGLYCERIN 0.4 MG SL SUBL: 0.4000 mg | SUBLINGUAL_TABLET | SUBLINGUAL | 3 refills | Status: DC | PRN

## 2016-07-13 MED ORDER — ALIROCUMAB 75 MG/ML ~~LOC~~ SOPN: 1.0000 "pen " | PEN_INJECTOR | SUBCUTANEOUS | 11 refills | Status: DC

## 2016-07-13 NOTE — Patient Instructions (Addendum)
Medication Instructions:  1. INCREASE METOPROLOL TARTRATE TO 75 MG TWICE DAILY (THIS WILL BE 1 AND 1/2 TABS TWICE DAILY) 2. REFILLS PRALUENT, NORVASC, PLAVIX, LISINOPRIL, NTG  Labwork: TODAY BMET, MAGNESIUM, LFT, LIPIDS  Testing/Procedures: NONE  Follow-Up: Your physician wants you to follow-up in: [name] DR. Emelda Fear will receive a reminder letter in the mail two months in advance. If you don't receive a letter, please call our office to schedule the follow-up appointment.  Any Other Special Instructions Will Be Listed Below (If Applicable).  If you need a refill on your cardiac medications before your next appointment, please call your pharmacy.

## 2016-07-13 NOTE — Telephone Encounter (Signed)
-----   Message from Leeroy Bock, [facility] sent at 07/13/2016  3:15 PM EST ----- Myalgias are possible with PCSK9i, although much less common than with statins. Could advise pt to skip his next injection to see if symptoms improve, then rechallenge with next dose in 1 month and see if symptoms reappear to help ensure correlation between symptoms and Praluent. If pt is not willing to do this, management will be tricky given his list of intolerances:  Crestor 40mg  - hives within the same day of starting, Pravastatin 40 and 80mg  - CK elevations to 8000, Lipitor 80mg  - myalgias, elevated LFTs). Lipitor 10 and 40mg  daily - myalgias.  Could consider a clinical trial if pt is willing to pursue this, but we would need baseline labs with him off Praluent for  6-8 weeks. Let me know what pt prefers to do - I can follow up with him if needed.  Megan   ----- Message ----- From: Liliane Shi, PA-C Sent: 07/13/2016   2:50 PM To: Leeroy Bock, RPH  Megan - He is complaining of leg cramps and leg pain.  Have there been any reports of this with Alirocumab?  I told him I doubted it would be from that but I did see myalgias listed (4%). Thanks, [employer]
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
## 2016-07-13 NOTE — Telephone Encounter (Signed)
Please tell patient I reviewed his case with M. Supple, PharmD. Leg symptoms are possible with his cholesterol medication but rare. He can skip his next injection to see if this helps. If it does, he should let us know.  If it help, I will have him follow up with the Lipid Clinic. If it makes no difference, he should remain on Praluent as currently prescribed. Richardson Dopp, PA-C   07/13/2016 5:18 PM

## 2016-07-14 LAB — MAGNESIUM: Magnesium: 2 mg/dL (ref 1.6–2.3)

## 2016-07-14 LAB — LIPID PANEL
Chol/HDL Ratio: 4.5 ratio units (ref 0.0–5.0)
Cholesterol, Total: 241 mg/dL — ABNORMAL HIGH (ref 100–199)
HDL: 54 mg/dL (ref >39)
LDL Calculated: 144 mg/dL — ABNORMAL HIGH (ref 0–99)
TRIGLYCERIDES: 217 mg/dL — AB (ref 0–149)
VLDL Cholesterol Cal: 43 mg/dL — ABNORMAL HIGH (ref 5–40)

## 2016-07-14 LAB — BASIC METABOLIC PANEL
BUN / CREAT RATIO: 5 — AB (ref 9–20)
BUN: 5 mg/dL — ABNORMAL LOW (ref 6–24)
CO2: 24 mmol/L (ref 18–29)
CREATININE: 0.96 mg/dL (ref 0.76–1.27)
Calcium: 9.9 mg/dL (ref 8.7–10.2)
Chloride: 97 mmol/L (ref 96–106)
GFR, EST AFRICAN AMERICAN: 101 mL/min/{1.73_m2} (ref >59)
GFR, EST NON AFRICAN AMERICAN: 87 mL/min/{1.73_m2} (ref >59)
GLUCOSE: 87 mg/dL (ref 65–99)
Potassium: 3.6 mmol/L (ref 3.5–5.2)
SODIUM: 139 mmol/L (ref 134–144)

## 2016-07-14 LAB — HEPATIC FUNCTION PANEL
ALK PHOS: 92 IU/L (ref 39–117)
ALT: 18 IU/L (ref 0–44)
AST: 18 IU/L (ref 0–40)
Albumin: 4.9 g/dL (ref 3.5–5.5)
BILIRUBIN, DIRECT: 0.14 mg/dL (ref 0.00–0.40)
Bilirubin Total: 0.5 mg/dL (ref 0.0–1.2)
Total Protein: 7.7 g/dL (ref 6.0–8.5)

## 2016-07-20 ENCOUNTER — Ambulatory Visit: Payer: Medicare Other | Admitting: Pharmacist
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
## 2016-07-20 NOTE — Telephone Encounter (Signed)
Lmtcb go over recommendations per Brynda Rim. PA in regards to cholesterol medication.

## 2016-07-21 NOTE — Telephone Encounter (Signed)
Lmtcb x 2. Looks like pt had appt yesterday with Pharm D Megan Supple, though pt did not show for appt. I will forward this message onto [facility] as well.

## 2016-07-28 NOTE — Telephone Encounter (Signed)
Also left message for pt. Just needs lipid clinic appt rescheduled to discuss other options for lipid treatment.

## 2016-08-02 ENCOUNTER — Ambulatory Visit (INDEPENDENT_AMBULATORY_CARE_PROVIDER_SITE_OTHER): Payer: Medicare Other | Admitting: Gastroenterology

## 2016-08-02 ENCOUNTER — Encounter: Payer: Self-pay | Admitting: Gastroenterology

## 2016-08-02 ENCOUNTER — Telehealth: Payer: Self-pay

## 2016-08-02 VITALS — BP 148/100 | HR 104 | Ht 66.0 in | Wt 160.4 lb

## 2016-08-02 DIAGNOSIS — K219 Gastro-esophageal reflux disease without esophagitis: Secondary | ICD-10-CM

## 2016-08-02 DIAGNOSIS — I209 Angina pectoris, unspecified: Secondary | ICD-10-CM

## 2016-08-02 DIAGNOSIS — R197 Diarrhea, unspecified: Secondary | ICD-10-CM

## 2016-08-02 DIAGNOSIS — Z800 Family history of malignant neoplasm of digestive organs: Secondary | ICD-10-CM | POA: Diagnosis not present

## 2016-08-02 DIAGNOSIS — I25119 Atherosclerotic heart disease of native coronary artery with unspecified angina pectoris: Secondary | ICD-10-CM | POA: Diagnosis not present

## 2016-08-02 MED ORDER — NA SULFATE-K SULFATE-MG SULF 17.5-3.13-1.6 GM/177ML PO SOLN: 1.0000 | Freq: Once | ORAL | 0 refills | Status: AC

## 2016-08-02 NOTE — Telephone Encounter (Signed)
He can hold plavix 5 days, resume post-procedure as soon as possible. thx
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
## 2016-08-02 NOTE — Telephone Encounter (Signed)
08/02/2016   RE: DALLAN LATZ DOB: [birth year] MRN: [id number]   Dear Dr [name],    We have scheduled the above patient for an endoscopic procedure (Colon/EGD). Our records show that he is on anticoagulation therapy.   Please advise as to how long the patient may come off his therapy of Plavix prior to the procedure, which is scheduled for 08-12-2016.  Please fax back/ or route the completed form to Christell Constant at [phone].   Sincerely,    Christell Constant

## 2016-08-02 NOTE — Patient Instructions (Signed)
If you are age 58 or older, your body mass index should be between 23-30. Your Body mass index is 25.89 kg/m. If this is out of the aforementioned range listed, please consider follow up with your Primary Care Provider.  If you are age 53 or younger, your body mass index should be between 19-25. Your Body mass index is 25.89 kg/m. If this is out of the aformentioned range listed, please consider follow up with your Primary Care Provider.   You have been scheduled for an endoscopy and colonoscopy. Please follow the written instructions given to you at your visit today. Please pick up your prep supplies at the pharmacy within the next 1-3 days. If you use inhalers (even only as needed), please bring them with you on the day of your procedure. Your physician has requested that you go to www.startemmi.com and enter the access code given to you at your visit today. This web site gives a general overview about your procedure. However, you should still follow specific instructions given to you by our office regarding your preparation for the procedure.  Thank you for choosing [facility]  Dr Wilfrid Lund III

## 2016-08-03 NOTE — Telephone Encounter (Signed)
Pt notified and aware. He states clear understanding.

## 2016-08-12 ENCOUNTER — Encounter: Payer: Self-pay | Admitting: Gastroenterology

## 2016-08-12 ENCOUNTER — Ambulatory Visit (AMBULATORY_SURGERY_CENTER): Payer: Medicare Other | Admitting: Gastroenterology

## 2016-08-12 VITALS — BP 174/90 | HR 94 | Temp 98.4°F | Resp 17 | Ht 66.0 in | Wt 160.0 lb

## 2016-08-12 DIAGNOSIS — D122 Benign neoplasm of ascending colon: Secondary | ICD-10-CM

## 2016-08-12 DIAGNOSIS — K219 Gastro-esophageal reflux disease without esophagitis: Secondary | ICD-10-CM

## 2016-08-12 DIAGNOSIS — D120 Benign neoplasm of cecum: Secondary | ICD-10-CM

## 2016-08-12 DIAGNOSIS — K2950 Unspecified chronic gastritis without bleeding: Secondary | ICD-10-CM

## 2016-08-12 DIAGNOSIS — R197 Diarrhea, unspecified: Secondary | ICD-10-CM | POA: Diagnosis not present

## 2016-08-12 DIAGNOSIS — Z800 Family history of malignant neoplasm of digestive organs: Secondary | ICD-10-CM

## 2016-08-12 MED ORDER — SODIUM CHLORIDE 0.9 % IV SOLN: 500.0000 mL | INTRAVENOUS | Status: DC

## 2016-08-12 NOTE — Patient Instructions (Signed)
YOU HAD AN ENDOSCOPIC PROCEDURE TODAY AT [facility]:   Refer to the procedure report that was given to you for any specific questions about what was found during the examination.  If the procedure report does not answer your questions, please call your gastroenterologist to clarify.  If you requested that your care partner not be given the details of your procedure findings, then the procedure report has been included in a sealed envelope for you to review at your convenience later.  YOU SHOULD EXPECT: Some feelings of bloating in the abdomen. Passage of more gas than usual.  Walking can help get rid of the air that was put into your GI tract during the procedure and reduce the bloating. If you had a lower endoscopy (such as a colonoscopy or flexible sigmoidoscopy) you may notice spotting of blood in your stool or on the toilet paper. If you underwent a bowel prep for your procedure, you may not have a normal bowel movement for a few days.  Please Note:  You might notice some irritation and congestion in your nose or some drainage.  This is from the oxygen used during your procedure.  There is no need for concern and it should clear up in a day or so.  SYMPTOMS TO REPORT IMMEDIATELY:   Following lower endoscopy (colonoscopy or flexible sigmoidoscopy):  Excessive amounts of blood in the stool  Significant tenderness or worsening of abdominal pains  Swelling of the abdomen that is new, acute  Fever of 100F or higher   Following upper endoscopy (EGD)  Vomiting of blood or coffee ground material  New chest pain or pain under the shoulder blades  Painful or persistently difficult swallowing  New shortness of breath  Fever of 100F or higher  Black, tarry-looking stools  For urgent or emergent issues, a gastroenterologist can be reached at any hour by calling [phone].   DIET:  We do recommend a small meal at first, but then you may proceed to your regular diet.  Drink  plenty of fluids but you should avoid alcoholic beverages for 24 hours.  ACTIVITY:  You should plan to take it easy for the rest of today and you should NOT DRIVE or use heavy machinery until tomorrow (because of the sedation medicines used during the test).    FOLLOW UP: Our staff will call the number listed on your records the next business day following your procedure to check on you and address any questions or concerns that you may have regarding the information given to you following your procedure. If we do not reach you, we will leave a message.  However, if you are feeling well and you are not experiencing any problems, there is no need to return our call.  We will assume that you have returned to your regular daily activities without incident.  If any biopsies were taken you will be contacted by phone or by letter within the next 1-3 weeks.  Please call us at [phone] if you have not heard about the biopsies in 3 weeks.    SIGNATURES/CONFIDENTIALITY: You and/or your care partner have signed paperwork which will be entered into your electronic medical record.  These signatures attest to the fact that that the information above on your After Visit Summary has been reviewed and is understood.  Full responsibility of the confidentiality of this discharge information lies with you and/or your care-partner.    Handouts were given to your care partner on polyps  and diverticulosis. Per Dr. Loletha Carrow, resume PLAVIX at prior dose tomorrow. You may resume your other current medications today. Await biopsy results. Please call if any questions or concerns.

## 2016-08-12 NOTE — Op Note (Signed)
[facility] Patient Name: [name] Procedure Date: 08/12/2016 1:39 PM MRN: [id number] Endoscopist: Mallie Mussel L. Loletha Pham , MD Age: 56 Referring MD:  Date of Birth: 09/02/1960 Gender: Male Account #: [id number] Procedure:                Upper GI endoscopy Indications:              Heartburn Medicines:                Monitored Anesthesia Care Procedure:                Pre-Anesthesia Assessment:                           - Prior to the procedure, a History and Physical                            was performed, and patient medications and                            allergies were reviewed. The patient's tolerance of                            previous anesthesia was also reviewed. The risks                            and benefits of the procedure and the sedation                            options and risks were discussed with the patient.                            All questions were answered, and informed consent                            was obtained. Prior Anticoagulants: The patient has                            taken Plavix (clopidogrel), last dose was 5 days                            prior to procedure. The patient was left on                            aspirin. ASA Grade Assessment: III - A patient with                            severe systemic disease. After reviewing the risks                            and benefits, the patient was deemed in                            satisfactory condition to undergo the procedure.  After obtaining informed consent, the endoscope was                            passed under direct vision. Throughout the                            procedure, the patient's blood pressure, pulse, and                            oxygen saturations were monitored continuously. The                            Model GIF-HQ190 [id number] scope was introduced                            through the mouth, and advanced to the second  part                            of duodenum. The upper GI endoscopy was                            accomplished without difficulty. The patient                            tolerated the procedure well. Scope In: Scope Out: Findings:                 LA Grade A (one or more mucosal breaks less than 5                            mm, not extending between tops of 2 mucosal folds)                            esophagitis was found.                           Diffuse atrophic mucosa was found in the gastric                            antrum. Biopsies were taken with a cold forceps for                            histology.                           The cardia and gastric fundus were normal on                            retroflexion.                           The examined duodenum was normal. Complications:            No immediate complications. Estimated Blood Loss:     Estimated blood loss: none. Impression:               -  LA Grade A reflux esophagitis.                           - Gastric mucosal atrophy. Biopsied.                           - Normal examined duodenum. Recommendation:           - Patient has a contact number available for                            emergencies. The signs and symptoms of potential                            delayed complications were discussed with the                            patient. Return to normal activities tomorrow.                            Written discharge instructions were provided to the                            patient.                           - Resume previous diet.                           - Continue present medications.                           - Discontinue the use of any products containing                            nicotine.                           - See the other procedure note for documentation of                            additional recommendations. Christopher Upperman L. Loletha Carrow, MD 08/12/2016 2:26:15 PM This report has been signed  electronically.

## 2016-08-12 NOTE — Progress Notes (Signed)
Patient awakening,vss,report to rn 

## 2016-08-12 NOTE — Progress Notes (Signed)
Called to room to assist during endoscopic procedure.  Patient ID and intended procedure confirmed with present staff. Received instructions for my participation in the procedure from the performing physician.  No changes from Pre-visit noted.

## 2016-08-12 NOTE — Op Note (Signed)
[place] Patient Name: Kyven Lutton Procedure Date: 08/12/2016 1:39 PM MRN: [id number] Endoscopist: Mallie Mussel L. Loletha Carrow , MD Age: 56 Referring MD:  Date of Birth: 14-Nov-1960 Gender: Male Account #: [id number] Procedure:                Colonoscopy Indications:              Chronic diarrhea Medicines:                Monitored Anesthesia Care Procedure:                Pre-Anesthesia Assessment:                           - Prior to the procedure, a History and Physical                            was performed, and patient medications and                            allergies were reviewed. The patient's tolerance of                            previous anesthesia was also reviewed. The risks                            and benefits of the procedure and the sedation                            options and risks were discussed with the patient.                            All questions were answered, and informed consent                            was obtained. Prior Anticoagulants: The patient has                            taken Plavix (clopidogrel), last dose was 5 days                            prior to procedure. The patient was left on                            aspirin. ASA Grade Assessment: III - A patient with                            severe systemic disease. After reviewing the risks                            and benefits, the patient was deemed in                            satisfactory condition to undergo the procedure.  After obtaining informed consent, the colonoscope                            was passed under direct vision. Throughout the                            procedure, the patient's blood pressure, pulse, and                            oxygen saturations were monitored continuously. The                            Model CF-HQ190L [id number]) scope was introduced                            through the anus and advanced to the the  cecum,                            identified by appendiceal orifice and ileocecal                            valve. The colonoscopy was performed without                            difficulty. The patient tolerated the procedure                            well. The quality of the bowel preparation was                            good. The ileocecal valve, appendiceal orifice, and                            rectum were photographed. The quality of the bowel                            preparation was evaluated using the BBPS [facility]                            Bowel Preparation Scale) with scores of: Right                            Colon = 2, Transverse Colon = 2 and Left Colon = 2.                            The total BBPS score equals 6. The bowel                            preparation used was SUPREP. Scope In: 2:02:50 PM Scope Out: 2:18:55 PM Scope Withdrawal Time: 0 hours 12 minutes 25 seconds  Total Procedure Duration: 0 hours 16 minutes 5 seconds  Findings:                 The perianal  and digital rectal examinations were                            normal.                           Multiple small-mouthed diverticula were found in                            the left colon.                           Normal mucosa was found in the entire colon.                            Biopsies for histology were taken with a cold                            forceps from the right colon and left colon for                            evaluation of microscopic colitis.                           A 4 mm polyp was found in the distal ascending                            colon. The polyp was sessile. The polyp was removed                            with a cold snare. Resection and retrieval were                            complete.                           Two sessile polyps were found in the proximal                            ascending colon and cecum. The polyps were 2 mm in                             size. These polyps were removed with a cold biopsy                            forceps. Resection and retrieval were complete.                           The exam was otherwise without abnormality on                            direct and retroflexion views. Complications:            No immediate complications. Estimated Blood Loss:     Estimated blood loss: none. Impression:               -  Diverticulosis in the left colon.                           - Normal mucosa in the entire examined colon.                            Biopsied.                           - One 4 mm polyp in the distal ascending colon,                            removed with a cold snare. Resected and retrieved.                           - Two 2 mm polyps in the proximal ascending colon                            and in the cecum, removed with a cold biopsy                            forceps. Resected and retrieved.                           - The examination was otherwise normal on direct                            and retroflexion views.                           If biopsies negative for microscopic colitis,                            anti-spasmodic therapy could be tried. Recommendation:           - Patient has a contact number available for                            emergencies. The signs and symptoms of potential                            delayed complications were discussed with the                            patient. Return to normal activities tomorrow.                            Written discharge instructions were provided to the                            patient.                           - Resume previous diet.                           -  Resume Plavix (clopidogrel) at prior dose                            tomorrow.                           - Await pathology results.                           - Repeat colonoscopy in 5 years for surveillance                            due to [place]. Henry L. Loletha Carrow, MD 08/12/2016 2:34:44 PM This report has been signed electronically.

## 2016-08-12 NOTE — Progress Notes (Signed)
No problems noted in the recovery room. maw 

## 2016-08-13 ENCOUNTER — Telehealth: Payer: Self-pay

## 2016-08-13 NOTE — Telephone Encounter (Signed)
  Follow up Call-  Call back number 08/12/2016  Post procedure Call Back phone  # [phone]  Permission to leave phone message Yes  Some recent data might be hidden     Patient questions:  Do you have a fever, pain , or abdominal swelling? No. Pain Score  0 *  Have you tolerated food without any problems? Yes.    Have you been able to return to your normal activities? Yes.    Do you have any questions about your discharge instructions: Diet   No. Medications  No. Follow up visit  No.  Do you have questions or concerns about your Care? No.  Actions: * If pain score is 4 or above: No action needed, pain <4.

## 2016-08-17 ENCOUNTER — Encounter: Payer: Self-pay | Admitting: Gastroenterology

## 2017-01-14 ENCOUNTER — Encounter: Payer: Self-pay | Admitting: Cardiology
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
## 2017-01-17 ENCOUNTER — Encounter: Payer: Self-pay | Admitting: Cardiology

## 2017-01-17 ENCOUNTER — Ambulatory Visit (INDEPENDENT_AMBULATORY_CARE_PROVIDER_SITE_OTHER): Payer: Medicare Other | Admitting: Cardiology

## 2017-01-17 VITALS — BP 150/91 | HR 98 | Ht 66.0 in | Wt 150.4 lb

## 2017-01-17 DIAGNOSIS — I25119 Atherosclerotic heart disease of native coronary artery with unspecified angina pectoris: Secondary | ICD-10-CM | POA: Diagnosis not present

## 2017-01-17 DIAGNOSIS — Z720 Tobacco use: Secondary | ICD-10-CM

## 2017-01-17 DIAGNOSIS — Z789 Other specified health status: Secondary | ICD-10-CM | POA: Diagnosis not present

## 2017-01-17 DIAGNOSIS — I209 Angina pectoris, unspecified: Secondary | ICD-10-CM

## 2017-01-17 DIAGNOSIS — I10 Essential (primary) hypertension: Secondary | ICD-10-CM

## 2017-01-17 DIAGNOSIS — E782 Mixed hyperlipidemia: Secondary | ICD-10-CM

## 2017-01-17 MED ORDER — ISOSORBIDE MONONITRATE ER 30 MG PO TB24
15.0000 mg | ORAL_TABLET | Freq: Every day | ORAL | 3 refills | Status: DC
Start: 2017-01-17 — End: 2019-02-22

## 2017-01-17 MED ORDER — LISINOPRIL 20 MG PO TABS
20.0000 mg | ORAL_TABLET | Freq: Every day | ORAL | 3 refills | Status: DC
Start: 2017-01-17 — End: 2019-02-22

## 2017-01-17 MED ORDER — NITROGLYCERIN 0.4 MG SL SUBL: 0.4000 mg | SUBLINGUAL_TABLET | SUBLINGUAL | 3 refills | Status: DC | PRN

## 2017-01-17 NOTE — Patient Instructions (Signed)
Medication Instructions:  Your physician has recommended you make the following change in your medication:   Begin taking Lisinopril 20 mg daily  Begin taking Imdur 15 mg in the evening  Please continue all other medications as prescribed  Labwork: BMET in 2 weeks  Testing/Procedures: NONE  Follow-Up: Your physician wants you to follow-up in: [place]. You will receive a reminder letter in the mail two months in advance. If you don't receive a letter, please call our office to schedule the follow-up appointment.  Any Other Special Instructions Will Be Listed Below (If Applicable).  If you need a refill on your cardiac medications before your next appointment, please call your pharmacy.

## 2017-01-25 ENCOUNTER — Ambulatory Visit: Payer: Medicare Other | Admitting: Pharmacist
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
## 2017-03-29 ENCOUNTER — Encounter (HOSPITAL_COMMUNITY): Payer: Self-pay | Admitting: Cardiology

## 2017-03-29 ENCOUNTER — Emergency Department (HOSPITAL_COMMUNITY)
Admission: EM | Admit: 2017-03-29 | Discharge: 2017-03-29 | Disposition: A | Discharge status: Home / Self Care | Payer: Medicare Other | Attending: Emergency Medicine | Admitting: Emergency Medicine

## 2017-03-29 ENCOUNTER — Emergency Department (HOSPITAL_COMMUNITY): Payer: Medicare Other

## 2017-03-29 DIAGNOSIS — R3911 Hesitancy of micturition: Secondary | ICD-10-CM | POA: Insufficient documentation

## 2017-03-29 DIAGNOSIS — F17210 Nicotine dependence, cigarettes, uncomplicated: Secondary | ICD-10-CM | POA: Insufficient documentation

## 2017-03-29 DIAGNOSIS — I2510 Atherosclerotic heart disease of native coronary artery without angina pectoris: Secondary | ICD-10-CM | POA: Diagnosis not present

## 2017-03-29 DIAGNOSIS — R1011 Right upper quadrant pain: Secondary | ICD-10-CM

## 2017-03-29 DIAGNOSIS — R110 Nausea: Secondary | ICD-10-CM

## 2017-03-29 DIAGNOSIS — I10 Essential (primary) hypertension: Secondary | ICD-10-CM | POA: Insufficient documentation

## 2017-03-29 DIAGNOSIS — R112 Nausea with vomiting, unspecified: Secondary | ICD-10-CM | POA: Insufficient documentation

## 2017-03-29 DIAGNOSIS — E785 Hyperlipidemia, unspecified: Secondary | ICD-10-CM | POA: Diagnosis not present

## 2017-03-29 LAB — COMPREHENSIVE METABOLIC PANEL
ALT: 25 U/L (ref 17–63)
AST: 29 U/L (ref 15–41)
Albumin: 4.6 g/dL (ref 3.5–5.0)
Alkaline Phosphatase: 101 U/L (ref 38–126)
Anion gap: 8 (ref 5–15)
BUN: 6 mg/dL (ref 6–20)
CALCIUM: 9.4 mg/dL (ref 8.9–10.3)
CO2: 28 mmol/L (ref 22–32)
Chloride: 98 mmol/L — ABNORMAL LOW (ref 101–111)
Creatinine, Ser: 0.99 mg/dL (ref 0.61–1.24)
GLUCOSE: 77 mg/dL (ref 65–99)
Potassium: 4.1 mmol/L (ref 3.5–5.1)
Sodium: 134 mmol/L — ABNORMAL LOW (ref 135–145)
Total Bilirubin: 0.6 mg/dL (ref 0.3–1.2)
Total Protein: 8.2 g/dL — ABNORMAL HIGH (ref 6.5–8.1)

## 2017-03-29 LAB — CBC
HEMATOCRIT: 41.8 % (ref 39.0–52.0)
Hemoglobin: 14.8 g/dL (ref 13.0–17.0)
MCH: 35.4 pg — AB (ref 26.0–34.0)
MCHC: 35.4 g/dL (ref 30.0–36.0)
MCV: 100 fL (ref 78.0–100.0)
Platelets: 231 10*3/uL (ref 150–400)
RBC: 4.18 MIL/uL — ABNORMAL LOW (ref 4.22–5.81)
RDW: 12.7 % (ref 11.5–15.5)
WBC: 7.1 10*3/uL (ref 4.0–10.5)

## 2017-03-29 LAB — URINALYSIS, ROUTINE W REFLEX MICROSCOPIC
Bilirubin Urine: NEGATIVE
GLUCOSE, UA: NEGATIVE mg/dL
Hgb urine dipstick: NEGATIVE
KETONES UR: NEGATIVE mg/dL
LEUKOCYTES UA: NEGATIVE
NITRITE: NEGATIVE
PROTEIN: NEGATIVE mg/dL
Specific Gravity, Urine: 1.006 (ref 1.005–1.030)
pH: 7 (ref 5.0–8.0)

## 2017-03-29 LAB — TROPONIN I: Troponin I: <0.03 ng/mL (ref <0.03)

## 2017-03-29 LAB — LIPASE, BLOOD: LIPASE: 26 U/L (ref 11–51)

## 2017-03-29 MED ORDER — GI COCKTAIL ~~LOC~~
30.0000 mL | Freq: Once | ORAL | Status: AC
  Administered 2017-03-29: 30 mL via ORAL
  Filled 2017-03-29: qty 30

## 2017-03-29 MED ORDER — METOCLOPRAMIDE HCL 10 MG PO TABS: 10.0000 mg | ORAL_TABLET | Freq: Four times a day (QID) | ORAL | 0 refills | Status: DC

## 2017-03-29 NOTE — ED Triage Notes (Addendum)
Epigastric pain that radiates around to back times 2 weeks.  Had CT scan last week and diagnosed with gall stones and enlarged  Stomach.  States his pain is getting worse.  Pt states he has seen a Psychologist, sport and exercise.  Also c/o diarrhea, vomiting and dysuria also.
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
## 2017-03-29 NOTE — Discharge Instructions (Signed)

## 2017-03-29 NOTE — ED Notes (Signed)
Bladder scan shows 262.  MD aware.

## 2017-03-31 LAB — URINE CULTURE
Culture: NO GROWTH
Special Requests: NORMAL

## 2017-04-06 ENCOUNTER — Ambulatory Visit (INDEPENDENT_AMBULATORY_CARE_PROVIDER_SITE_OTHER): Payer: Medicare Other | Admitting: Nurse Practitioner

## 2017-04-06 ENCOUNTER — Encounter: Payer: Self-pay | Admitting: Nurse Practitioner

## 2017-04-06 VITALS — BP 144/88 | HR 92 | Ht 65.5 in | Wt 150.2 lb

## 2017-04-06 DIAGNOSIS — R1010 Upper abdominal pain, unspecified: Secondary | ICD-10-CM

## 2017-04-06 DIAGNOSIS — R112 Nausea with vomiting, unspecified: Secondary | ICD-10-CM

## 2017-04-06 DIAGNOSIS — K8020 Calculus of gallbladder without cholecystitis without obstruction: Secondary | ICD-10-CM

## 2017-04-06 MED ORDER — ONDANSETRON HCL 4 MG PO TABS: 4.0000 mg | ORAL_TABLET | Freq: Four times a day (QID) | ORAL | 1 refills | Status: DC

## 2017-04-06 NOTE — Patient Instructions (Signed)
If you are age 58 or older, your body mass index should be between 23-30. Your Body mass index is 24.62 kg/m. If this is out of the aforementioned range listed, please consider follow up with your Primary Care Provider.  If you are age 36 or younger, your body mass index should be between 19-25. Your Body mass index is 24.62 kg/m. If this is out of the aformentioned range listed, please consider follow up with your Primary Care Provider.   We have sent the following medications to your pharmacy for you to pick up at your convenience: [place]  Will call you after I talk with Dr. Loletha Carrow.  Thank you for choosing me and [facility].   Tye Savoy, NP
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
## 2017-04-07 NOTE — Progress Notes (Signed)
Thank you for sending this case to me. I have reviewed the entire note.  I also reviewed my note and procedure reports.  When I last saw the patient, his multiple GI symptoms seemed likely functional and at least partially related to chronic anxiety (which has not been under good control recently according to primary care note). He has already seen a Psychologist, sport and exercise at [facility] (note in [place]), who was uncertain that this was biliary in nature.  I agree it sounds difficult to know for certain.  I do not think another EGD will likely be revealing, having just had one in February.  He needs zofran and better control of his anxiety.  If zofran no help, try dicyclomine as long as he is not having trouble with constipation.  If that fails, then he should see the same surgeon again and consider gallbladder removal.    Wilfrid Lund, MD

## 2019-02-13 ENCOUNTER — Telehealth: Payer: Self-pay | Admitting: *Deleted

## 2019-02-13 NOTE — Telephone Encounter (Signed)
He is Dr. Myles Gip patient, last seen by him in July 2018.

## 2019-02-13 NOTE — Telephone Encounter (Signed)
Also made Stacy aware that he would need to be seen by Dr Domenic Polite prior to any advice given on plavix - she will make surgeon aware

## 2019-02-13 NOTE — Telephone Encounter (Signed)
Stacy with [facility] surgical [facility] requesting if pt can hold plavix prior to gallbladder surgery on 02/21/2019 and if so how long - aware that pt is scheduled to see Dr Bronson Ing as a NP on 8/20 as he hasn't been seen for 3 years.

## 2019-02-13 NOTE — Telephone Encounter (Signed)
My mistake, will forward to Dr Domenic Polite and have this appt changed

## 2019-02-13 NOTE — Telephone Encounter (Signed)
LM to return call.

## 2019-02-13 NOTE — Telephone Encounter (Signed)
I saw this patient once in July 2018.  He is going to have to have a preoperative visit with [place] prior to making any further recommendations.

## 2019-02-14 NOTE — Telephone Encounter (Signed)
Pt scheduled for 9/18 with Dr Domenic Polite

## 2019-02-15 ENCOUNTER — Ambulatory Visit: Payer: Medicare Other | Admitting: Cardiovascular Disease

## 2019-02-22 ENCOUNTER — Encounter: Payer: Self-pay | Admitting: Student

## 2019-02-22 ENCOUNTER — Ambulatory Visit (INDEPENDENT_AMBULATORY_CARE_PROVIDER_SITE_OTHER): Payer: Medicare Other | Admitting: Student

## 2019-02-22 ENCOUNTER — Other Ambulatory Visit: Payer: Self-pay

## 2019-02-22 VITALS — BP 148/89 | HR 81 | Temp 98.1°F | Ht 66.0 in | Wt 153.0 lb

## 2019-02-22 DIAGNOSIS — Z01810 Encounter for preprocedural cardiovascular examination: Secondary | ICD-10-CM

## 2019-02-22 DIAGNOSIS — I10 Essential (primary) hypertension: Secondary | ICD-10-CM | POA: Diagnosis not present

## 2019-02-22 DIAGNOSIS — Z720 Tobacco use: Secondary | ICD-10-CM

## 2019-02-22 DIAGNOSIS — I2510 Atherosclerotic heart disease of native coronary artery without angina pectoris: Secondary | ICD-10-CM

## 2019-02-22 DIAGNOSIS — E785 Hyperlipidemia, unspecified: Secondary | ICD-10-CM | POA: Diagnosis not present

## 2019-02-22 NOTE — Patient Instructions (Signed)
Medication Instructions:  Your physician recommends that you continue on your current medications as directed. Please refer to the Current Medication list given to you today.   Labwork: I WILL REQUEST LABS FROM PCP   Testing/Procedures: NONE  Follow-Up: Your physician recommends that you schedule a follow-up appointment in: [place] IN 3 MONTHS    Any Other Special Instructions Will Be Listed Below (If Applicable).     If you need a refill on your cardiac medications before your next appointment, please call your pharmacy.
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
## 2019-02-23 NOTE — Progress Notes (Signed)
Thank you for seeing him.  I would start by getting the records.  If he had DES intervention in the absence of ACS, sometimes a 9-month course of DAPT is more of an adequate option, and he might be able to temporarily hold the Plavix now.  With multiple DES however, he will still have some risk of stent thrombosis, and he would need to understand that if he decides to pursue treatment prior to 1 year.

## 2019-03-01 ENCOUNTER — Telehealth: Payer: Self-pay | Admitting: *Deleted

## 2019-03-01 NOTE — Telephone Encounter (Signed)
Dr. Manuella Ghazi is asking for provider to give him a call about Dorus Bruschi DOB 1960-11-02 [id number].  The issue is in regard to a patient that [place] saw recently, Gerald Stabs. Please refer to the note. Question at hand is whether he can safely hold Plavix for gallbladder surgery less than 1 year out from DES intervention done at an outside facility in [place]. Can you please make sure that our office has formally requested records from the hospital mentioned in Brittany's note?   Message left on vm at [facility] requesting the status of medical record request.

## 2019-03-02 ENCOUNTER — Encounter: Payer: Self-pay | Admitting: Student

## 2019-03-02 ENCOUNTER — Telehealth: Payer: Self-pay | Admitting: Student

## 2019-03-02 NOTE — Telephone Encounter (Signed)
    Called and spoke with patient after reviewing hospital records. His cardiac catheterization was performed in the setting of unstable angina and he underwent stent placement x3 as outlined in his recent office note. He has denied anyrecurrent anginal symptoms and has overall done well.  Reviewed with the patient that ideally we would like for him to continue on ASA and Plavix uninterrupted until 06/2019 but given his progressive GI symptoms, he feels he cannot wait that long. He is therefore at intermediate risk for cardiac complications and is aware of the increased risk. Would recommend holding Plavix 5 days prior to surgery and hopefully can continue ASA during that timeframe. Letter sent to his surgeon today.  Signed, Erma Heritage, PA-C 03/02/2019, 11:48 AM Pager: [phone]

## 2019-03-16 ENCOUNTER — Encounter

## 2019-03-16 ENCOUNTER — Ambulatory Visit: Payer: Medicare Other | Admitting: Cardiology

## 2019-03-29 HISTORY — PX: CHOLECYSTECTOMY: SHX55

## 2019-04-11 ENCOUNTER — Ambulatory Visit: Payer: Medicare Other | Admitting: Physician Assistant

## 2019-05-29 ENCOUNTER — Telehealth: Payer: Self-pay | Admitting: Cardiology

## 2019-05-29 NOTE — Telephone Encounter (Signed)
Virtual Visit Pre-Appointment Phone Call  "(Name), I am calling you today to discuss your upcoming appointment. We are currently trying to limit exposure to the virus that causes COVID-19 by seeing patients at home rather than in the office."  1. "What is the BEST phone number to call the day of the visit?" - include this in appointment notes  2. Do you have or have access to (through a family member/friend) a smartphone with video capability that we can use for your visit?" a. If yes - list this number in appt notes as cell (if different from BEST phone #) and list the appointment type as a VIDEO visit in appointment notes b. If no - list the appointment type as a PHONE visit in appointment notes  3. Confirm consent - "In the setting of the current Covid19 crisis, you are scheduled for a (phone or video) visit with your provider on (date) at (time).  Just as we do with many in-office visits, in order for you to participate in this visit, we must obtain consent.  If you'd like, I can send this to your mychart (if signed up) or email for you to review.  Otherwise, I can obtain your verbal consent now.  All virtual visits are billed to your insurance company just like a normal visit would be.  By agreeing to a virtual visit, we'd like you to understand that the technology does not allow for your provider to perform an examination, and thus may limit your provider's ability to fully assess your condition. If your provider identifies any concerns that need to be evaluated in person, we will make arrangements to do so.  Finally, though the technology is pretty good, we cannot assure that it will always work on either your or our end, and in the setting of a video visit, we may have to convert it to a phone-only visit.  In either situation, we cannot ensure that we have a secure connection.  Are you willing to proceed?" STAFF: Did the patient verbally acknowledge consent to telehealth visit? Document  YES/NO here: yes  4. Advise patient to be prepared - "Two hours prior to your appointment, go ahead and check your blood pressure, pulse, oxygen saturation, and your weight (if you have the equipment to check those) and write them all down. When your visit starts, your provider will ask you for this information. If you have an Apple Watch or Kardia device, please plan to have heart rate information ready on the day of your appointment. Please have a pen and paper handy nearby the day of the visit as well."  5. Give patient instructions for MyChart download to smartphone OR Doximity/Doxy.me as below if video visit (depending on what platform provider is using)  6. Inform patient they will receive a phone call 15 minutes prior to their appointment time (may be from unknown caller ID) so they should be prepared to answer    TELEPHONE CALL NOTE  [name] has been deemed a candidate for a follow-up tele-health visit to limit community exposure during the Covid-19 pandemic. I spoke with the patient via phone to ensure availability of phone/video source, confirm preferred email & phone number, and discuss instructions and expectations.  I reminded [name] to be prepared with any vital sign and/or heart rhythm information that could potentially be obtained via home monitoring, at the time of his visit. I reminded DEMETRION BLUNK to expect a phone call prior to  his visit.  [name] 05/29/2019 4:33 PM   INSTRUCTIONS FOR DOWNLOADING THE MYCHART APP TO SMARTPHONE  - The patient must first make sure to have activated MyChart and know their login information - If Apple, go to [employer] and type in MyChart in the search bar and download the app. If Android, ask patient to go to [employer] and type in [place] in the search bar and download the app. The app is free but as with any other app downloads, their phone may require them to verify saved payment information or  Apple/Android password.  - The patient will need to then log into the app with their MyChart username and password, and select [facility] as their healthcare provider to link the account. When it is time for your visit, go to the MyChart app, find appointments, and click Begin Video Visit. Be sure to Select Allow for your device to access the Microphone and Camera for your visit. You will then be connected, and your provider will be with you shortly.  **If they have any issues connecting, or need assistance please contact MyChart service desk [phone])**  **If using a computer, in order to ensure the best quality for their visit they will need to use either of the following Internet Browsers: [employer], or Google Chrome**  IF USING DOXIMITY or DOXY.ME - The patient will receive a link just prior to their visit by text.     FULL LENGTH CONSENT FOR TELE-HEALTH VISIT   I hereby voluntarily request, consent and authorize [facility] and its employed or contracted physicians, physician assistants, nurse practitioners or other licensed health care professionals (the Practitioner), to provide me with telemedicine health care services (the Services") as deemed necessary by the treating Practitioner. I acknowledge and consent to receive the Services by the Practitioner via telemedicine. I understand that the telemedicine visit will involve communicating with the Practitioner through live audiovisual communication technology and the disclosure of certain medical information by electronic transmission. I acknowledge that I have been given the opportunity to request an in-person assessment or other available alternative prior to the telemedicine visit and am voluntarily participating in the telemedicine visit.  I understand that I have the right to withhold or withdraw my consent to the use of telemedicine in the course of my care at any time, without affecting my right to future care  or treatment, and that the Practitioner or I may terminate the telemedicine visit at any time. I understand that I have the right to inspect all information obtained and/or recorded in the course of the telemedicine visit and may receive copies of available information for a reasonable fee.  I understand that some of the potential risks of receiving the Services via telemedicine include:   Delay or interruption in medical evaluation due to technological equipment failure or disruption;  Information transmitted may not be sufficient (e.g. poor resolution of images) to allow for appropriate medical decision making by the Practitioner; and/or   In rare instances, security protocols could fail, causing a breach of personal health information.  Furthermore, I acknowledge that it is my responsibility to provide information about my medical history, conditions and care that is complete and accurate to the best of my ability. I acknowledge that Practitioner's advice, recommendations, and/or decision may be based on factors not within their control, such as incomplete or inaccurate data provided by me or distortions of diagnostic images or specimens that may result from electronic transmissions. I  understand that the practice of medicine is not an exact science and that Practitioner makes no warranties or guarantees regarding treatment outcomes. I acknowledge that I will receive a copy of this consent concurrently upon execution via email to the email address I last provided but may also request a printed copy by calling the office of [facility].    I understand that my insurance will be billed for this visit.   I have read or had this consent read to me.  I understand the contents of this consent, which adequately explains the benefits and risks of the Services being provided via telemedicine.   I have been provided ample opportunity to ask questions regarding this consent and the Services and have had  my questions answered to my satisfaction.  I give my informed consent for the services to be provided through the use of telemedicine in my medical care  By participating in this telemedicine visit I agree to the above.

## 2019-05-31 ENCOUNTER — Telehealth (INDEPENDENT_AMBULATORY_CARE_PROVIDER_SITE_OTHER): Payer: Medicare Other | Admitting: Family Medicine

## 2019-05-31 ENCOUNTER — Encounter: Payer: Self-pay | Admitting: Cardiology

## 2019-05-31 VITALS — BP 171/93 | HR 103 | Ht 66.0 in | Wt 160.0 lb

## 2019-05-31 DIAGNOSIS — I208 Other forms of angina pectoris: Secondary | ICD-10-CM

## 2019-05-31 DIAGNOSIS — I739 Peripheral vascular disease, unspecified: Secondary | ICD-10-CM | POA: Diagnosis not present

## 2019-05-31 DIAGNOSIS — I25119 Atherosclerotic heart disease of native coronary artery with unspecified angina pectoris: Secondary | ICD-10-CM | POA: Diagnosis not present

## 2019-05-31 DIAGNOSIS — I10 Essential (primary) hypertension: Secondary | ICD-10-CM

## 2019-05-31 MED ORDER — AMLODIPINE BESYLATE 5 MG PO TABS: 5.0000 mg | ORAL_TABLET | Freq: Every day | ORAL | 3 refills | Status: DC
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
## 2019-05-31 NOTE — Patient Instructions (Signed)
Medication Instructions:  START Amlodipine 5 mg daily   *If you need a refill on your cardiac medications before your next appointment, please call your pharmacy*  Lab Work: None today If you have labs (blood work) drawn today and your tests are completely normal, you will receive your results only by: Marland Kitchen MyChart Message (if you have MyChart) OR . A paper copy in the mail If you have any lab test that is abnormal or we need to change your treatment, we will call you to review the results.  Testing/Procedures: None today  Follow-Up: At [facility], you and your health needs are our priority.  As part of our continuing mission to provide you with exceptional heart care, we have created designated Provider Care Teams.  These Care Teams include your primary Cardiologist (physician) and Advanced Practice Providers (APPs -  Physician Assistants and Nurse Practitioners) who all work together to provide you with the care you need, when you need it.  Your next appointment:   6 month(s)  The format for your next appointment:   In Person  Provider:   Rozann Lesches, MD  Other Instructions None    Thank you for choosing [facility] !

## 2019-06-01 ENCOUNTER — Ambulatory Visit: Payer: Medicare Other | Admitting: Cardiology

## 2019-12-03 ENCOUNTER — Ambulatory Visit: Payer: Medicare Other | Admitting: Cardiology

## 2020-11-17 ENCOUNTER — Other Ambulatory Visit: Payer: Self-pay

## 2020-11-17 ENCOUNTER — Emergency Department (HOSPITAL_COMMUNITY): Payer: Medicare Other

## 2020-11-17 ENCOUNTER — Emergency Department (HOSPITAL_COMMUNITY)
Admission: EM | Admit: 2020-11-17 | Discharge: 2020-11-17 | Discharge status: Against Medical Advice | Payer: Medicare Other | Attending: Emergency Medicine | Admitting: Emergency Medicine

## 2020-11-17 ENCOUNTER — Encounter (HOSPITAL_COMMUNITY): Payer: Self-pay | Admitting: *Deleted

## 2020-11-17 DIAGNOSIS — I2510 Atherosclerotic heart disease of native coronary artery without angina pectoris: Secondary | ICD-10-CM | POA: Insufficient documentation

## 2020-11-17 DIAGNOSIS — F17210 Nicotine dependence, cigarettes, uncomplicated: Secondary | ICD-10-CM | POA: Insufficient documentation

## 2020-11-17 DIAGNOSIS — K5792 Diverticulitis of intestine, part unspecified, without perforation or abscess without bleeding: Secondary | ICD-10-CM

## 2020-11-17 DIAGNOSIS — Z7982 Long term (current) use of aspirin: Secondary | ICD-10-CM | POA: Insufficient documentation

## 2020-11-17 DIAGNOSIS — Z79899 Other long term (current) drug therapy: Secondary | ICD-10-CM | POA: Diagnosis not present

## 2020-11-17 DIAGNOSIS — K219 Gastro-esophageal reflux disease without esophagitis: Secondary | ICD-10-CM | POA: Diagnosis not present

## 2020-11-17 DIAGNOSIS — R109 Unspecified abdominal pain: Secondary | ICD-10-CM | POA: Diagnosis present

## 2020-11-17 DIAGNOSIS — I10 Essential (primary) hypertension: Secondary | ICD-10-CM | POA: Diagnosis not present

## 2020-11-17 DIAGNOSIS — K5732 Diverticulitis of large intestine without perforation or abscess without bleeding: Secondary | ICD-10-CM | POA: Insufficient documentation

## 2020-11-17 DIAGNOSIS — Z955 Presence of coronary angioplasty implant and graft: Secondary | ICD-10-CM | POA: Insufficient documentation

## 2020-11-17 LAB — COMPREHENSIVE METABOLIC PANEL
ALT: 21 U/L (ref 0–44)
AST: 25 U/L (ref 15–41)
Albumin: 4.6 g/dL (ref 3.5–5.0)
Alkaline Phosphatase: 118 U/L (ref 38–126)
Anion gap: 12 (ref 5–15)
BUN: 8 mg/dL (ref 8–23)
CO2: 25 mmol/L (ref 22–32)
Calcium: 9.8 mg/dL (ref 8.9–10.3)
Chloride: 98 mmol/L (ref 98–111)
Creatinine, Ser: 0.82 mg/dL (ref 0.61–1.24)
GFR, Estimated: >60 mL/min (ref >60)
Glucose, Bld: 92 mg/dL (ref 70–99)
Potassium: 3.4 mmol/L — ABNORMAL LOW (ref 3.5–5.1)
Sodium: 135 mmol/L (ref 135–145)
Total Bilirubin: 1.4 mg/dL — ABNORMAL HIGH (ref 0.3–1.2)
Total Protein: 8.8 g/dL — ABNORMAL HIGH (ref 6.5–8.1)

## 2020-11-17 LAB — CBC
HCT: 49.7 % (ref 39.0–52.0)
Hemoglobin: 17.1 g/dL — ABNORMAL HIGH (ref 13.0–17.0)
MCH: 34.5 pg — ABNORMAL HIGH (ref 26.0–34.0)
MCHC: 34.4 g/dL (ref 30.0–36.0)
MCV: 100.2 fL — ABNORMAL HIGH (ref 80.0–100.0)
Platelets: 235 10*3/uL (ref 150–400)
RBC: 4.96 MIL/uL (ref 4.22–5.81)
RDW: 12.5 % (ref 11.5–15.5)
WBC: 7.3 10*3/uL (ref 4.0–10.5)
nRBC: 0 % (ref 0.0–0.2)

## 2020-11-17 LAB — URINALYSIS, ROUTINE W REFLEX MICROSCOPIC
Bacteria, UA: NONE SEEN
Bilirubin Urine: NEGATIVE
Glucose, UA: NEGATIVE mg/dL
Ketones, ur: 20 mg/dL — AB
Leukocytes,Ua: NEGATIVE
Nitrite: NEGATIVE
Protein, ur: 100 mg/dL — AB
Specific Gravity, Urine: 1.01 (ref 1.005–1.030)
pH: 5 (ref 5.0–8.0)

## 2020-11-17 LAB — LIPASE, BLOOD: Lipase: 26 U/L (ref 11–51)

## 2020-11-17 MED ORDER — SODIUM CHLORIDE 0.9 % IV SOLN
12.5000 mg | Freq: Four times a day (QID) | INTRAVENOUS | Status: DC | PRN
  Filled 2020-11-17: qty 0.5

## 2020-11-17 MED ORDER — ONDANSETRON 4 MG PO TBDP
4.0000 mg | ORAL_TABLET | Freq: Once | ORAL | Status: AC
  Administered 2020-11-17: 4 mg via ORAL
  Filled 2020-11-17: qty 1

## 2020-11-17 MED ORDER — ACETAMINOPHEN 325 MG PO TABS: 650.0000 mg | ORAL_TABLET | Freq: Once | ORAL | Status: DC

## 2020-11-17 MED ORDER — HYDROCODONE-ACETAMINOPHEN 5-325 MG PO TABS: 1.0000 | ORAL_TABLET | Freq: Four times a day (QID) | ORAL | 0 refills | Status: DC | PRN

## 2020-11-17 MED ORDER — METRONIDAZOLE 500 MG PO TABS: 500.0000 mg | ORAL_TABLET | Freq: Two times a day (BID) | ORAL | 0 refills | Status: AC

## 2020-11-17 MED ORDER — METOPROLOL SUCCINATE ER 25 MG PO TB24: 50.0000 mg | ORAL_TABLET | Freq: Every day | ORAL | 0 refills | Status: DC

## 2020-11-17 MED ORDER — CIPROFLOXACIN HCL 500 MG PO TABS: 500.0000 mg | ORAL_TABLET | Freq: Two times a day (BID) | ORAL | 0 refills | Status: DC

## 2020-11-17 MED ORDER — SODIUM CHLORIDE 0.9 % IV BOLUS: 1000.0000 mL | Freq: Once | INTRAVENOUS | Status: DC

## 2020-11-17 MED ORDER — AMLODIPINE BESYLATE 5 MG PO TABS: 5.0000 mg | ORAL_TABLET | Freq: Every day | ORAL | 2 refills | Status: DC

## 2020-11-17 MED ORDER — ONDANSETRON 4 MG PO TBDP: 4.0000 mg | ORAL_TABLET | Freq: Three times a day (TID) | ORAL | 0 refills | Status: DC | PRN
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
## 2020-11-17 NOTE — Discharge Instructions (Addendum)
Your work-up today revealed diverticulitis.  Please take the antibiotics as prescribed.  Read the attachment.  You have been discharged home with pain medication as well as antinausea medications.  I have also prescribed your previously prescribed blood pressure medications.  Your blood pressure was dangerously high here in the ER and given your vital signs I did not feel comfortable discharging home which is why you have had to sign the paperwork that you are leaving [place].  I have also placed a consult to our social work team who will reach out to you for medication assistance.  I do not want financial constraints to be the reason why you are not taking your blood pressure medications.  Please return to the ER or seek immediate medical attention should you experience any new or worsening symptoms.  You have chosen to leave [place]. Should you change your mind, you are always welcome and encouraged to return to the ED. You are encouraged to follow-up with, at the very least, a primary care provider, or other similar medical professional on this matter.

## 2020-11-17 NOTE — ED Triage Notes (Signed)
Pt c/o abdominal pain x 3 days with n/v/d; pt states he has not had an appetite and unable to eat x 2 days; pt states the pain starts at his umbilicus and radiates to the right side of his abdomen

## 2020-11-17 NOTE — ED Notes (Signed)
PA Green aware of pt's elevated B/P.

## 2020-11-20 ENCOUNTER — Other Ambulatory Visit: Payer: Self-pay

## 2020-11-20 ENCOUNTER — Encounter: Payer: Self-pay | Admitting: General Surgery

## 2020-11-20 ENCOUNTER — Ambulatory Visit (INDEPENDENT_AMBULATORY_CARE_PROVIDER_SITE_OTHER): Payer: Medicare Other | Admitting: General Surgery

## 2020-11-20 VITALS — BP 178/95 | HR 69 | Temp 98.4°F | Resp 12 | Ht 67.0 in | Wt 154.0 lb

## 2020-11-20 DIAGNOSIS — K5732 Diverticulitis of large intestine without perforation or abscess without bleeding: Secondary | ICD-10-CM | POA: Diagnosis not present

## 2020-11-20 DIAGNOSIS — N321 Vesicointestinal fistula: Secondary | ICD-10-CM | POA: Insufficient documentation

## 2020-11-20 MED ORDER — OXYCODONE HCL 5 MG PO TABS: 5.0000 mg | ORAL_TABLET | ORAL | 0 refills | Status: DC | PRN

## 2020-11-20 NOTE — Patient Instructions (Addendum)
Will get you referred back to colonoscopy to evaluate given the new onset diverticulitis Will refer you to Urology for potential need for Colovesical fistula.   Diverticulosis/ Diverticulitis Information and Diet:   Diverticulosis is a condition in which small, bulging pouches (diverticuli) form inside the lower part of the intestine, usually in the colon. Constipation and straining during bowel movements can worsen the condition. A diet rich in fiber can help keep stools soft and prevent inflammation.  Diverticulitis occurs when the pouches in the colon become infected or inflamed. Dietary changes can help the colon heal.  Fiber is an important part of the diet for patients with diverticulosis. A high-fiber diet softens and gives bulk to the stool, allowing it to pass quickly and easily.  Diet for Diverticulosis Eat a high-fiber diet when you have diverticulosis. Fiber softens the stool and helps prevent constipation. It also can help decrease pressure in the colon and help prevent flare-ups of diverticulitis.  High-fiber foods include:  Beans and legumes Bran, whole wheat bread and whole grain cereals such as oatmeal Brown and wild rice Fruits such as apples, bananas and pears Vegetables such as broccoli, carrots, corn and squash Whole wheat pasta If you currently don't have a diet high in fiber, you should add fiber gradually. This helps avoid bloating and abdominal discomfort. The target is to eat 25 to 30 grams of fiber daily. Drink at least 8 cups of fluid daily. Fluid will help soften your stool. Exercise also promotes bowel movement and helps prevent constipation.  When the colon is not inflamed, eat popcorn, nuts and seeds as tolerated.  Diet for Diverticulitis During flare ups of diverticulitis, follow a clear liquid diet. Your doctor will let you know when to progress from clear liquids to low fiber solids and then back to your normal diet.  A clear liquid diet means no solid  foods. Juices should have no pulp. During the clear liquid diet, you may consume:  Broth Clear juices such as apple, cranberry and grape. (Avoid orange juice) Jell-O Popsicles  When you're able to eat solid food, choose low fiber foods while healing. Low fiber foods include:  Canned or cooked fruit without seeds or skin, such as applesauce and melon Canned or well cooked vegetables without seeds and skin Dairy products such as cheese, milk and yogurt Eggs Low-fiber cereal Meat that is ground or tender and well cooked Pasta White bread and white rice AVOID RED MEAT WHILE YOU HAVE AN ACTIVE FLARE.  AVOID NSAIDS, IBUPROFEN, ALEVE, ASPIRIN WHILE YOU HAVE AN ACTIVE FLARE.  EXERCISE AS WELL AS SMOKING CESSATION CAN HELP PREVENT RECURRENCES.   After symptoms improve, usually within two to four days, you may add 5 to 15 grams of fiber a day back into your diet. Resume your high fiber diet when you no longer have symptoms.   Diverticulitis  Diverticulitis is when small pouches in your colon (large intestine) get infected or swollen. This causes pain in the belly (abdomen) and watery poop (diarrhea). These pouches are called diverticula. The pouches form in people who have a condition called diverticulosis. What are the causes? This condition may be caused by poop (stool) that gets trapped in the pouches in your colon. The poop lets germs (bacteria) grow in the pouches. This causes the infection. What increases the risk? You are more likely to get this condition if you have small pouches in your colon. The risk is higher if:  You are overweight or very overweight (obese).  You do not exercise enough.  You drink alcohol.  You smoke or use products with tobacco in them.  You eat a diet that has a lot of red meat such as beef, pork, or lamb.  You eat a diet that does not have enough fiber in it.  You are older than 62 years of age. What are the signs or symptoms?  Pain in the  belly. Pain is often on the left side, but it may be in other areas.  Fever and feeling cold.  Feeling like you may vomit.  Vomiting.  Having cramps.  Feeling full.  Changes to how often you poop.  Blood in your poop. How is this treated? Most cases are treated at home by:  Taking over-the-counter pain medicines.  Following a clear liquid diet.  Taking antibiotic medicines.  Resting. Very bad cases may need to be treated at a hospital. This may include:  Not eating or drinking.  Taking prescription pain medicine.  Getting antibiotic medicines through an IV tube.  Getting fluid and food through an IV tube.  Having surgery. When you are feeling better, your doctor may tell you to have a test to check your colon (colonoscopy). Follow these instructions at home: Medicines  Take over-the-counter and prescription medicines only as told by your doctor. These include: ? Antibiotics. ? Pain medicines. ? Fiber pills. ? Probiotics. ? Stool softeners.  If you were prescribed an antibiotic medicine, take it as told by your doctor. Do not stop taking the antibiotic even if you start to feel better.  Ask your doctor if the medicine prescribed to you requires you to avoid driving or using machinery. Eating and drinking  Follow a diet as told by your doctor.  When you feel better, your doctor may tell you to change your diet. You may need to eat a lot of fiber. Fiber makes it easier to poop (have a bowel movement). Foods with fiber include: ? Berries. ? Beans. ? Lentils. ? Green vegetables.  Avoid eating red meat.   General instructions  Do not use any products that contain nicotine or tobacco, such as cigarettes, e-cigarettes, and chewing tobacco. If you need help quitting, ask your doctor.  Exercise 3 or more times a week. Try to get 30 minutes each time. Exercise enough to sweat and make your heart beat faster.  Keep all follow-up visits as told by your doctor.  This is important. Contact a doctor if:  Your pain does not get better.  You are not pooping like normal. Get help right away if:  Your pain gets worse.  Your symptoms do not get better.  Your symptoms get worse very fast.  You have a fever.  You vomit more than one time.  You have poop that is: ? Bloody. ? Black. ? Tarry. Summary  This condition happens when small pouches in your colon get infected or swollen.  Take medicines only as told by your doctor.  Follow a diet as told by your doctor.  Keep all follow-up visits as told by your doctor. This is important. This information is not intended to replace advice given to you by your health care provider. Make sure you discuss any questions you have with your health care provider. Document Revised: 03/26/2019 Document Reviewed: 03/26/2019 Elsevier Patient Education  2021 [employer].
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
## 2020-11-27 ENCOUNTER — Other Ambulatory Visit: Payer: Self-pay

## 2020-11-27 ENCOUNTER — Ambulatory Visit (INDEPENDENT_AMBULATORY_CARE_PROVIDER_SITE_OTHER): Payer: Medicare Other | Admitting: General Surgery

## 2020-11-27 ENCOUNTER — Encounter: Payer: Self-pay | Admitting: General Surgery

## 2020-11-27 VITALS — BP 169/96 | HR 71 | Temp 98.6°F | Resp 14 | Ht 67.0 in | Wt 152.0 lb

## 2020-11-27 DIAGNOSIS — Z7189 Other specified counseling: Secondary | ICD-10-CM | POA: Diagnosis not present

## 2020-11-27 DIAGNOSIS — N321 Vesicointestinal fistula: Secondary | ICD-10-CM

## 2020-11-27 DIAGNOSIS — K5732 Diverticulitis of large intestine without perforation or abscess without bleeding: Secondary | ICD-10-CM

## 2020-11-27 MED ORDER — DOCUSATE SODIUM 100 MG PO CAPS: 100.0000 mg | ORAL_CAPSULE | Freq: Two times a day (BID) | ORAL | 0 refills | Status: AC

## 2020-11-27 MED ORDER — AMOXICILLIN-POT CLAVULANATE 875-125 MG PO TABS: 1.0000 | ORAL_TABLET | Freq: Two times a day (BID) | ORAL | 0 refills | Status: AC

## 2020-11-27 MED ORDER — OXYCODONE HCL 5 MG PO TABS: 5.0000 mg | ORAL_TABLET | ORAL | 0 refills | Status: DC | PRN

## 2020-11-27 NOTE — Patient Instructions (Signed)
Keep stools soft and regular. Take colace twice a day and if no BM take some miralax. Continue to increase diet. Tylenol and ibuprofen for pain and narcotic for breakthrough pain.  Restart antibiotic for diverticulitis, urinary symptoms.

## 2020-11-27 NOTE — Progress Notes (Signed)
[facility] Note   HPI:  60 y.o. Male presents to clinic for follow-up evaluation of his diverticulitis and possible colovesical fistula. Doing fair but more pain in the lower abdomen and having some air possible in the urine.   Review of Systems:  Pain ? Air in urine No sediment  All other review of systems: otherwise negative   Vital Signs:  BP (!) 169/96   Pulse 71   Temp 98.6 F (37 C) (Other (Comment))   Resp 14   Ht 5\' 7"  (1.702 m)   Wt 152 lb (68.9 kg)   SpO2 94%   BMI 23.81 kg/m    Physical Exam:  Physical Exam HENT:     Head: Normocephalic.  Cardiovascular:     Rate and Rhythm: Normal rate.  Pulmonary:     Effort: Pulmonary effort is normal.  Abdominal:     General: There is no distension.     Palpations: Abdomen is soft.     Tenderness: There is abdominal tenderness.  Neurological:     Mental Status: He is alert.     Assessment:  60 y.o. yo Male with diverticulitis and possible colovesical fistula. Needs Urology, GI for colonoscopy.  Plan:  Keep stools soft and regular. Take colace twice a day and if no BM take some miralax. Continue to increase diet. Tylenol and ibuprofen for pain and narcotic for breakthrough pain.  Restart antibiotic for diverticulitis, urinary symptoms.   Curlene Labrum, MD [facility] [address] [place], [place] [address] [phone] (office)    Curlene Labrum, MD [facility] [address] [place], [place] [address] [phone] (office)

## 2020-12-03 ENCOUNTER — Telehealth: Payer: Self-pay | Admitting: Family Medicine

## 2020-12-03 DIAGNOSIS — K5732 Diverticulitis of large intestine without perforation or abscess without bleeding: Secondary | ICD-10-CM

## 2020-12-03 NOTE — Telephone Encounter (Signed)
Pt called and would like a refill on his pain medication. Also patient has been having more pain and n&v. He said he got better then yesterday he started feeling bad again. Informed him that he can rotated tylenol and ibuprophen q 6 hours and take the Zofran q 6 hours. Pt verbalized understanding.

## 2020-12-05 MED ORDER — OXYCODONE HCL 5 MG PO TABS: 5.0000 mg | ORAL_TABLET | ORAL | 0 refills | Status: DC | PRN

## 2020-12-05 NOTE — Addendum Note (Signed)
Addended by: Curlene Labrum on: 12/05/2020 10:18 AM   Modules accepted: Orders

## 2020-12-05 NOTE — Telephone Encounter (Signed)
[facility]  If pain continues to get worse or fever or not eating. Will likely need to go tho hospital.  Curlene Labrum, MD [facility] [address] [place], [place] [address] [phone] (office)

## 2020-12-11 ENCOUNTER — Encounter: Payer: Self-pay | Admitting: General Surgery

## 2020-12-11 ENCOUNTER — Other Ambulatory Visit: Payer: Self-pay

## 2020-12-11 ENCOUNTER — Ambulatory Visit (INDEPENDENT_AMBULATORY_CARE_PROVIDER_SITE_OTHER): Payer: Medicare Other | Admitting: General Surgery

## 2020-12-11 VITALS — BP 171/99 | HR 75 | Temp 98.3°F | Resp 14 | Ht 67.0 in | Wt 149.0 lb

## 2020-12-11 DIAGNOSIS — R300 Dysuria: Secondary | ICD-10-CM

## 2020-12-11 MED ORDER — OXYCODONE HCL 5 MG PO TABS: 5.0000 mg | ORAL_TABLET | ORAL | 0 refills | Status: DC | PRN

## 2020-12-11 MED ORDER — CYCLOBENZAPRINE HCL 5 MG PO TABS: 5.0000 mg | ORAL_TABLET | Freq: Three times a day (TID) | ORAL | 1 refills | Status: DC | PRN

## 2020-12-11 MED ORDER — PHENAZOPYRIDINE HCL 200 MG PO TABS: 200.0000 mg | ORAL_TABLET | Freq: Three times a day (TID) | ORAL | 0 refills | Status: DC | PRN

## 2020-12-11 NOTE — Patient Instructions (Signed)
Continue to keep stools soft and regular.  Come off of the colace as you are able Get your urine checked to see if any UTI. Try Flexeril for spasms/ cramps. Try the  for bladder spasms.

## 2020-12-11 NOTE — Progress Notes (Signed)
[facility] Note   HPI:  60 y.o. Male presents to clinic for follow-up evaluation of his diverticulitis and colovesical fistula. He says he still having crampy and burning irritation with urination and with Bms. He denies any sediment but has honey colored urine. He has some burning. His stools are regular and soft. He had finished his antibiotics.   Review of Systems:  No fevers Lower abdominal pain/ cramps/ burning  Urethra burning  All other review of systems: otherwise negative   Vital Signs:  BP (!) 171/99   Pulse 75   Temp 98.3 F (36.8 C) (Other (Comment))   Resp 14   Ht 5\' 7"  (1.702 m)   Wt 149 lb (67.6 kg)   SpO2 98%   BMI 23.34 kg/m    Physical Exam:  Physical Exam Vitals reviewed.  Cardiovascular:     Rate and Rhythm: Normal rate.  Pulmonary:     Effort: Pulmonary effort is normal.  Abdominal:     General: There is no distension.     Palpations: Abdomen is soft.     Comments: Mildly tender lower abdomen  Neurological:     Mental Status: He is alert.     Assessment:  60 y.o. yo Male with CT scan concerning for colovesical fistula and diverticulitis. He is doing better but still with burning/ cramping pain.  Plan:  Continue to keep stools soft and regular.  Come off of the colace as you are able Get your urine checked to see if any UTI. Will call with results.  Try Flexeril for spasms/ cramps. Try the pyridium for bladder spasms/ bladder irritation.  Will check on after McKenzie sees you  Future Appointments  Date Time Provider [place]  12/22/2020  2:30 PM McKenzie, Candee Furbish, MD [facility] None  12/30/2020  9:30 AM Virl Cagey, MD [facility] None  01/14/2021 11:00 AM Willia Craze, NP [facility]     Curlene Labrum, MD [facility] [address] [place], [place] [address] [phone] (office)

## 2020-12-12 LAB — URINALYSIS, ROUTINE W REFLEX MICROSCOPIC
Bilirubin, UA: NEGATIVE
Glucose, UA: NEGATIVE
Ketones, UA: NEGATIVE
Leukocytes,UA: NEGATIVE
Nitrite, UA: NEGATIVE
RBC, UA: NEGATIVE
Specific Gravity, UA: 1.015 (ref 1.005–1.030)
Urobilinogen, Ur: 0.2 mg/dL (ref 0.2–1.0)
pH, UA: 6 (ref 5.0–7.5)

## 2020-12-14 LAB — URINE CULTURE: Organism ID, Bacteria: NO GROWTH

## 2020-12-15 NOTE — Progress Notes (Signed)
UA looks good. No need for antibiotics. Can you let him know.

## 2020-12-17 NOTE — Progress Notes (Signed)
Pt aware.

## 2020-12-22 ENCOUNTER — Ambulatory Visit: Payer: Medicare Other | Admitting: Urology

## 2020-12-26 ENCOUNTER — Other Ambulatory Visit: Payer: Self-pay

## 2020-12-26 ENCOUNTER — Ambulatory Visit (INDEPENDENT_AMBULATORY_CARE_PROVIDER_SITE_OTHER): Payer: Medicare Other | Admitting: Urology

## 2020-12-26 VITALS — BP 188/101 | HR 99

## 2020-12-26 DIAGNOSIS — N401 Enlarged prostate with lower urinary tract symptoms: Secondary | ICD-10-CM

## 2020-12-26 DIAGNOSIS — N138 Other obstructive and reflux uropathy: Secondary | ICD-10-CM

## 2020-12-26 DIAGNOSIS — R300 Dysuria: Secondary | ICD-10-CM | POA: Diagnosis not present

## 2020-12-26 LAB — URINALYSIS, ROUTINE W REFLEX MICROSCOPIC
Bilirubin, UA: NEGATIVE
Glucose, UA: NEGATIVE
Leukocytes,UA: NEGATIVE
Nitrite, UA: NEGATIVE
Protein,UA: NEGATIVE
Specific Gravity, UA: 1.02 (ref 1.005–1.030)
Urobilinogen, Ur: 0.2 mg/dL (ref 0.2–1.0)
pH, UA: 6 (ref 5.0–7.5)

## 2020-12-26 LAB — MICROSCOPIC EXAMINATION
Bacteria, UA: NONE SEEN
Epithelial Cells (non renal): NONE SEEN /hpf (ref 0–10)
Renal Epithel, UA: NONE SEEN /hpf
WBC, UA: NONE SEEN /hpf (ref 0–5)

## 2020-12-26 MED ORDER — ALFUZOSIN HCL ER 10 MG PO TB24: 10.0000 mg | ORAL_TABLET | Freq: Every day | ORAL | 11 refills | Status: DC

## 2020-12-26 NOTE — Patient Instructions (Signed)
Benign Prostatic Hyperplasia  Benign prostatic hyperplasia (BPH) is an enlarged prostate gland that is caused by the normal aging process and not by cancer. The prostate is a walnut-sized gland that is involved in the production of semen. It is located in front of the rectum and below the bladder. The bladder stores urine and the urethra is the tube that carries the urine out of the body. The prostate may get bigger asa man gets older. An enlarged prostate can press on the urethra. This can make it harder to pass urine. The build-up of urine in the bladder can cause infection. Back pressure and infection may progress to bladder damage and kidney (renal) failure. What are the causes? This condition is part of a normal aging process. However, not all men develop problems from this condition. If the prostate enlarges away from the urethra, urine flow will not be blocked. If it enlarges toward the urethra andcompresses it, there will be problems passing urine. What increases the risk? This condition is more likely to develop in men over the age of 50 years. What are the signs or symptoms? Symptoms of this condition include: Getting up often during the night to urinate. Needing to urinate frequently during the day. Difficulty starting urine flow. Decrease in size and strength of your urine stream. Leaking (dribbling) after urinating. Inability to pass urine. This needs immediate treatment. Inability to completely empty your bladder. Pain when you pass urine. This is more common if there is also an infection. Urinary tract infection (UTI). How is this diagnosed? This condition is diagnosed based on your medical history, a physical exam, and your symptoms. Tests will also be done, such as: A post-void bladder scan. This measures any amount of urine that may remain in your bladder after you finish urinating. A digital rectal exam. In a rectal exam, your health care provider checks your prostate by  putting a lubricated, gloved finger into your rectum to feel the back of your prostate gland. This exam detects the size of your gland and any abnormal lumps or growths. An exam of your urine (urinalysis). A prostate specific antigen (PSA) screening. This is a blood test used to screen for prostate cancer. An ultrasound. This test uses sound waves to electronically produce a picture of your prostate gland. Your health care provider may refer you to a specialist in kidney and prostate diseases (urologist). How is this treated? Once symptoms begin, your health care provider will monitor your condition (active surveillance or watchful waiting). Treatment for this condition will depend on the severity of your condition. Treatment may include: Observation and yearly exams. This may be the only treatment needed if your condition and symptoms are mild. Medicines to relieve your symptoms, including: Medicines to shrink the prostate. Medicines to relax the muscle of the prostate. Surgery in severe cases. Surgery may include: Prostatectomy. In this procedure, the prostate tissue is removed completely through an open incision or with a laparoscope or robotics. Transurethral resection of the prostate (TURP). In this procedure, a tool is inserted through the opening at the tip of the penis (urethra). It is used to cut away tissue of the inner core of the prostate. The pieces are removed through the same opening of the penis. This removes the blockage. Transurethral incision (TUIP). In this procedure, small cuts are made in the prostate. This lessens the prostate's pressure on the urethra. Transurethral microwave thermotherapy (TUMT). This procedure uses microwaves to create heat. The heat destroys and removes a small   amount of prostate tissue. Transurethral needle ablation (TUNA). This procedure uses radio frequencies to destroy and remove a small amount of prostate tissue. Interstitial laser coagulation (ILC).  This procedure uses a laser to destroy and remove a small amount of prostate tissue. Transurethral electrovaporization (TUVP). This procedure uses electrodes to destroy and remove a small amount of prostate tissue. Prostatic urethral lift. This procedure inserts an implant to push the lobes of the prostate away from the urethra. Follow these instructions at home: Take over-the-counter and prescription medicines only as told by your health care provider. Monitor your symptoms for any changes. Contact your health care provider with any changes. Avoid drinking large amounts of liquid before going to bed or out in public. Avoid or reduce how much caffeine or alcohol you drink. Give yourself time when you urinate. Keep all follow-up visits as told by your health care provider. This is important. Contact a health care provider if: You have unexplained back pain. Your symptoms do not get better with treatment. You develop side effects from the medicine you are taking. Your urine becomes very dark or has a bad smell. Your lower abdomen becomes distended and you have trouble passing your urine. Get help right away if: You have a fever or chills. You suddenly cannot urinate. You feel lightheaded, or very dizzy, or you faint. There are large amounts of blood or clots in the urine. Your urinary problems become hard to manage. You develop moderate to severe low back or flank pain. The flank is the side of your body between the ribs and the hip. These symptoms may represent a serious problem that is an emergency. Do not wait to see if the symptoms will go away. Get medical help right away. Call your local emergency services (911 in the [place] Do not drive yourself to the hospital. Summary Benign prostatic hyperplasia (BPH) is an enlarged prostate that is caused by the normal aging process and not by cancer. An enlarged prostate can press on the urethra. This can make it hard to pass urine. This  condition is part of a normal aging process and is more likely to develop in men over the age of 50 years. Get help right away if you suddenly cannot urinate. This information is not intended to replace advice given to you by your health care provider. Make sure you discuss any questions you have with your healthcare provider. Document Revised: 02/21/2020 Document Reviewed: 02/21/2020 Elsevier Patient Education  2022 [employer]  

## 2020-12-26 NOTE — Progress Notes (Signed)
Urological Symptom Review  Patient is experiencing the following symptoms: Frequent urination Burning/pain with urination Get up at night to urinate Leakage of urine Trouble starting stream Weak stream Penile pain Have to strain to urinate   Review of Systems  Gastrointestinal (upper)  : Nausea Indigestion/heartburn  Gastrointestinal (lower) : Negative for lower GI symptoms  Constitutional : Negative for symptoms  Skin: Negative for skin symptoms  Eyes: Negative for eye symptoms  Ear/Nose/Throat : Sinus problems  Hematologic/Lymphatic: Negative for Hematologic/Lymphatic symptoms  Cardiovascular : Negative for cardiovascular symptoms  Respiratory : Negative for respiratory symptoms  Endocrine: Negative for endocrine symptoms  Musculoskeletal: Back pain  Neurological: Headaches  Psychologic: Negative for psychiatric symptoms
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
## 2020-12-30 ENCOUNTER — Encounter: Payer: Self-pay | Admitting: Urology

## 2020-12-30 ENCOUNTER — Ambulatory Visit: Payer: Medicare Other | Admitting: General Surgery
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
## 2021-01-08 ENCOUNTER — Other Ambulatory Visit: Payer: Self-pay | Admitting: *Deleted

## 2021-01-08 ENCOUNTER — Encounter: Payer: Self-pay | Admitting: Family Medicine

## 2021-01-08 ENCOUNTER — Ambulatory Visit (INDEPENDENT_AMBULATORY_CARE_PROVIDER_SITE_OTHER): Payer: Medicare Other | Admitting: Family Medicine

## 2021-01-08 VITALS — BP 204/108 | HR 98 | Ht 66.0 in | Wt 152.0 lb

## 2021-01-08 DIAGNOSIS — Z79899 Other long term (current) drug therapy: Secondary | ICD-10-CM

## 2021-01-08 DIAGNOSIS — E782 Mixed hyperlipidemia: Secondary | ICD-10-CM

## 2021-01-08 DIAGNOSIS — I25119 Atherosclerotic heart disease of native coronary artery with unspecified angina pectoris: Secondary | ICD-10-CM

## 2021-01-08 DIAGNOSIS — I10 Essential (primary) hypertension: Secondary | ICD-10-CM

## 2021-01-08 DIAGNOSIS — Z720 Tobacco use: Secondary | ICD-10-CM

## 2021-01-08 DIAGNOSIS — R0989 Other specified symptoms and signs involving the circulatory and respiratory systems: Secondary | ICD-10-CM

## 2021-01-08 MED ORDER — ATORVASTATIN CALCIUM 40 MG PO TABS: 40.0000 mg | ORAL_TABLET | Freq: Every day | ORAL | 1 refills | Status: DC

## 2021-01-08 MED ORDER — NITROGLYCERIN 0.4 MG SL SUBL: 0.4000 mg | SUBLINGUAL_TABLET | SUBLINGUAL | 2 refills | Status: DC | PRN

## 2021-01-08 MED ORDER — METOPROLOL SUCCINATE ER 50 MG PO TB24: 50.0000 mg | ORAL_TABLET | Freq: Every day | ORAL | 1 refills | Status: DC

## 2021-01-08 MED ORDER — ASPIRIN EC 81 MG PO TBEC: 81.0000 mg | DELAYED_RELEASE_TABLET | Freq: Every day | ORAL | 1 refills | Status: DC

## 2021-01-08 MED ORDER — AMLODIPINE BESYLATE 5 MG PO TABS: 5.0000 mg | ORAL_TABLET | Freq: Every day | ORAL | 1 refills | Status: DC

## 2021-01-08 NOTE — Patient Instructions (Addendum)
Medication Instructions:  Your physician has recommended you make the following change in your medication:  Restart amlodipine 5 mg daily Restart metoprolol succinate 50 mg daily Restart atorvastatin 40 mg daily at bedtime Restart aspirin 81 mg daily Continue other medication the same  Labwork: Your physician recommends that you return for a FASTING lipid/liver profile in 6-8 weeks just before your next visit. Please do not eat or drink for at least 8 hours when you have this done. You may take your medications that morning with a sip of water. [facility] any day Monday-Friday from 8:00 am to 4:00 pm  Testing/Procedures: Your physician has requested that you have a carotid duplex. This test is an ultrasound of the carotid arteries in your neck. It looks at blood flow through these arteries that supply the brain with blood. Allow one hour for this exam. There are no restrictions or special instructions.  Follow-Up: Your physician recommends that you schedule a follow-up appointment in: 6-8 weeks  Any Other Special Instructions Will Be Listed Below (If Applicable). Please purchase a blood pressure cuff Your physician has requested that you regularly monitor and record your blood pressure readings at home for the next 2 weeks. Please use the same machine at the same time of day to check your readings and record them.  Call office or send blood pressure readings through mychart  If you need a refill on your cardiac medications before your next appointment, please call your pharmacy.

## 2021-01-14 ENCOUNTER — Encounter: Payer: Self-pay | Admitting: Nurse Practitioner

## 2021-01-14 ENCOUNTER — Ambulatory Visit (INDEPENDENT_AMBULATORY_CARE_PROVIDER_SITE_OTHER): Payer: Medicare Other | Admitting: Nurse Practitioner

## 2021-01-14 ENCOUNTER — Telehealth: Payer: Self-pay

## 2021-01-14 VITALS — BP 120/80 | HR 98 | Ht 67.0 in | Wt 152.0 lb

## 2021-01-14 DIAGNOSIS — K5732 Diverticulitis of large intestine without perforation or abscess without bleeding: Secondary | ICD-10-CM

## 2021-01-14 DIAGNOSIS — I25119 Atherosclerotic heart disease of native coronary artery with unspecified angina pectoris: Secondary | ICD-10-CM | POA: Diagnosis not present

## 2021-01-14 DIAGNOSIS — N138 Other obstructive and reflux uropathy: Secondary | ICD-10-CM

## 2021-01-14 MED ORDER — SUPREP BOWEL PREP KIT 17.5-3.13-1.6 GM/177ML PO SOLN: 1.0000 | ORAL | 0 refills | Status: DC

## 2021-01-14 NOTE — Telephone Encounter (Signed)
Please advise on next step for patient

## 2021-01-14 NOTE — Patient Instructions (Addendum)
If you are age 62 or younger, your body mass index should be between 19-25. Your Body mass index is 23.81 kg/m. If this is out of the aformentioned range listed, please consider follow up with your Primary Care Provider.   The [facility] providers would like to encourage you to use [facility] to communicate with providers for non-urgent requests or questions.  Due to long hold times on the telephone, sending your provider a message by [facility] may be faster and more efficient way to get a response. Please allow 48 business hours for a response.  Please remember that this is for non-urgent requests/questions.  PROCEDURES: You have been scheduled for a colonoscopy. Please follow the written instructions given to you at your visit today. Please pick up your prep supplies at the pharmacy within the next 1-3 days. If you use inhalers (even only as needed), please bring them with you on the day of your procedure.  It was great seeing you today! Thank you for entrusting me with your care and choosing [facility].  Tye Savoy, NP
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
## 2021-01-14 NOTE — Telephone Encounter (Signed)
Patient calling to let Dr Alyson Ingles know the alfuzosin (UROXATRAL) 10 MG 24 hr tablet is not helping.  He cannot tell any difference. Has taken for 3 weeks.  Please advise.  Thanks, Helene Kelp

## 2021-01-15 ENCOUNTER — Telehealth: Payer: Self-pay | Admitting: Nurse Practitioner

## 2021-01-15 ENCOUNTER — Encounter: Payer: Self-pay | Admitting: Nurse Practitioner

## 2021-01-15 NOTE — Telephone Encounter (Signed)
OTC miralax/Gatorade prep instructions sent via [place].

## 2021-01-15 NOTE — Telephone Encounter (Signed)
Inbound call from patient stating prep medication is over $100 which he cannot afford and is requesting alternate prep be sent to pharmacy please.

## 2021-01-16 ENCOUNTER — Telehealth: Payer: Self-pay

## 2021-01-16 ENCOUNTER — Other Ambulatory Visit: Payer: Self-pay

## 2021-01-16 MED ORDER — METRONIDAZOLE 500 MG PO TABS: 500.0000 mg | ORAL_TABLET | Freq: Two times a day (BID) | ORAL | 0 refills | Status: AC

## 2021-01-16 MED ORDER — CIPROFLOXACIN HCL 500 MG PO TABS: 500.0000 mg | ORAL_TABLET | Freq: Two times a day (BID) | ORAL | 0 refills | Status: AC

## 2021-01-16 NOTE — Progress Notes (Signed)
____________________________________________________________  Attending physician addendum:  Thank you for sending this case to me. I have reviewed the entire note and personally reviewed the images from the CT urogram on 11/17/2020.  I agree that despite the radiologic description of irregular liver contour, he does not have clinically apparent stigmata of cirrhosis or portal hypertension.  That said, I agree he needs to significantly decrease alcohol intake.  He most likely has persistent low-grade diverticulitis that has led to this colovesicular fistula.  Since he reported he was not particularly tender on exam, I do not think he needs repeat CT imaging prior to CT scan.  However, I recommend he take a 10-day course of ciprofloxacin and metronidazole, which will take him through the time of the colonoscopy.  Wilfrid Lund, MD  ____________________________________________________________

## 2021-01-16 NOTE — Telephone Encounter (Signed)
Advised the patient of the plan. He agrees to this. Patient tells me he is unable to afford his prep (Suprep) and he asks for an alternative. No samples available. I was able to find a sample of Plenvu. The patient will come to the office to pick this up with the new instructions for prepping with Plenvu. Antibiotics to [facility] in [place].

## 2021-01-16 NOTE — Telephone Encounter (Signed)
-----   Message from Willia Craze, NP sent at 01/16/2021  8:39 AM EDT ----- Eustaquio Maize, would you please call patient and tell him that we are going to proceed with a colonoscopy as scheduled.  However we would like to put him on Cipro and Flagyl in case he has any  residual diverticulitis causing his pain.  Please call in a week's worth Flagyl 500 mg twice daily and Cipro 500 mg twice daily.  Thanks

## 2021-01-20 ENCOUNTER — Ambulatory Visit: Payer: Medicare Other | Admitting: Family Medicine

## 2021-01-20 ENCOUNTER — Ambulatory Visit: Payer: Medicare Other | Admitting: General Surgery

## 2021-01-21 MED ORDER — SILODOSIN 8 MG PO CAPS: 8.0000 mg | ORAL_CAPSULE | Freq: Every day | ORAL | 2 refills | Status: DC

## 2021-01-21 NOTE — Telephone Encounter (Signed)
Patient called and notified of rx sent to pharmacy.

## 2021-01-26 ENCOUNTER — Other Ambulatory Visit: Payer: Self-pay

## 2021-01-26 ENCOUNTER — Other Ambulatory Visit: Payer: Self-pay | Admitting: Gastroenterology

## 2021-01-26 ENCOUNTER — Ambulatory Visit (AMBULATORY_SURGERY_CENTER): Payer: Medicare Other | Admitting: Gastroenterology

## 2021-01-26 ENCOUNTER — Encounter: Payer: Self-pay | Admitting: Gastroenterology

## 2021-01-26 VITALS — BP 155/104 | HR 79 | Temp 97.8°F | Resp 18 | Ht 67.0 in | Wt 152.0 lb

## 2021-01-26 DIAGNOSIS — K5732 Diverticulitis of large intestine without perforation or abscess without bleeding: Secondary | ICD-10-CM

## 2021-01-26 DIAGNOSIS — D128 Benign neoplasm of rectum: Secondary | ICD-10-CM | POA: Diagnosis not present

## 2021-01-26 LAB — HM COLONOSCOPY

## 2021-01-26 MED ORDER — OXYCODONE HCL 5 MG PO TABS: 5.0000 mg | ORAL_TABLET | Freq: Four times a day (QID) | ORAL | 0 refills | Status: DC | PRN

## 2021-01-26 MED ORDER — SODIUM CHLORIDE 0.9 % IV SOLN: 500.0000 mL | Freq: Once | INTRAVENOUS | Status: DC

## 2021-01-26 NOTE — Op Note (Signed)
[facility] Patient Name: [name] Procedure Date: 01/26/2021 11:26 AM MRN: [id number] Endoscopist: Mallie Mussel L. Loletha Carrow , MD Age: 60 Referring MD:  Date of Birth: [birth year] Gender: Male Account #: [id number] Procedure:                Colonoscopy Indications:              Colovesical fistula                           Last colonoscopy 07/2016 with diverticuloisis and                            three < 73m adenomatous polyps Medicines:                Monitored Anesthesia Care Procedure:                After obtaining informed consent, the colonoscope                            was passed under direct vision. Throughout the                            procedure, the patient's blood pressure, pulse, and                            oxygen saturations were monitored continuously. The                            Olympus CF-HQ190L (Serial# [id number]) Colonoscope was                            introduced through the anus and advanced to the the                            sigmoid colon. The colonoscopy was performed with                            difficulty due to multiple diverticula in the colon                            and a tortuous colon. The patient tolerated the                            procedure well. The quality of the bowel                            preparation was good. The rectum and sigmoid colon                            were photographed. Scope In: 11:34:22 AM Scope Out: 11:48:05 AM Total Procedure Duration: 0 hours 13 minutes 43 seconds  Findings:                 The perianal and digital rectal examinations were  normal.                           A 6 mm polyp was found in the rectum. The polyp was                            sessile. The polyp was removed with a hot snare.                            Resection and retrieval were complete.                           Multiple diverticula were found in the sigmoid                             colon. There was associated tortuosity and haustral                            thickening with limited mobility ("fixed"). Adult                            colonoscope was changed to an ultraslim                            colonoscope, but the scope was unable to pass                            through the sigmoid colon. There was edema from                            chronic diverticulitis, but no mass was seen to the                            extent of scope insertion (approximately 30cm from                            anal verge) Complications:            No immediate complications. Estimated Blood Loss:     Estimated blood loss: none. Impression:               - One 6 mm polyp in the rectum, removed with a hot                            snare. Resected and retrieved.                           - Diverticulosis in the sigmoid colon. Recommendation:           - Patient has a contact number available for                            emergencies. The signs and symptoms of potential  delayed complications were discussed with the                            patient. Return to normal activities tomorrow.                            Written discharge instructions were provided to the                            patient.                           - Resume previous diet.                           - Await pathology results.                           - Repeat colonoscopy is recommended for                            surveillance. The colonoscopy date will be                            determined after pathology results from today's                            exam become available for review and surgical plan                            for colovesicular fistula is established.                           - Return to your surgeon for further plan regarding                            the fistula. Sherel Fennell L. Loletha Carrow, MD 01/26/2021 12:05:00 PM This report has been signed  electronically. CC Letter to:             Ria Comment C. Bridges(general surgery)                           Nicolette Bang (urology)

## 2021-01-26 NOTE — Progress Notes (Signed)
Pt complaining of 10/10 crampy abdominal pain . Assessed by Dr. Loletha Carrow. Prescription for pain medication to be ordered by Dr. Loletha Carrow for patient to pick up at pharmacy. Pt ambulated to bathroom where he states he passed some liquid stool and gas. All discharge instructions reviewed with patient. Patient demonstrated understanding. Patient will call with any questions or concerns.

## 2021-01-26 NOTE — Progress Notes (Signed)
VS by CW. ?

## 2021-01-26 NOTE — Progress Notes (Signed)
pt tolerated well. VSS. awake and to recovery. Report given to RN.  

## 2021-01-26 NOTE — Patient Instructions (Signed)
Handouts on polyps and diverticulosis and diverticulitis given to you today Await pathology results    YOU HAD AN ENDOSCOPIC PROCEDURE TODAY AT [facility]:   Refer to the procedure report that was given to you for any specific questions about what was found during the examination.  If the procedure report does not answer your questions, please call your gastroenterologist to clarify.  If you requested that your care partner not be given the details of your procedure findings, then the procedure report has been included in a sealed envelope for you to review at your convenience later.  YOU SHOULD EXPECT: Some feelings of bloating in the abdomen. Passage of more gas than usual.  Walking can help get rid of the air that was put into your GI tract during the procedure and reduce the bloating. If you had a lower endoscopy (such as a colonoscopy or flexible sigmoidoscopy) you may notice spotting of blood in your stool or on the toilet paper. If you underwent a bowel prep for your procedure, you may not have a normal bowel movement for a few days.  Please Note:  You might notice some irritation and congestion in your nose or some drainage.  This is from the oxygen used during your procedure.  There is no need for concern and it should clear up in a day or so.  SYMPTOMS TO REPORT IMMEDIATELY:  Following lower endoscopy (colonoscopy or flexible sigmoidoscopy):  Excessive amounts of blood in the stool  Significant tenderness or worsening of abdominal pains  Swelling of the abdomen that is new, acute  Fever of 100F or higher  For urgent or emergent issues, a gastroenterologist can be reached at any hour by calling [phone]. Do not use MyChart messaging for urgent concerns.    DIET:  We do recommend a small meal at first, but then you may proceed to your regular diet.  Drink plenty of fluids but you should avoid alcoholic beverages for 24 hours.  ACTIVITY:  You should plan to  take it easy for the rest of today and you should NOT DRIVE or use heavy machinery until tomorrow (because of the sedation medicines used during the test).    FOLLOW UP: Our staff will call the number listed on your records 48-72 hours following your procedure to check on you and address any questions or concerns that you may have regarding the information given to you following your procedure. If we do not reach you, we will leave a message.  We will attempt to reach you two times.  During this call, we will ask if you have developed any symptoms of COVID 19. If you develop any symptoms (ie: fever, flu-like symptoms, shortness of breath, cough etc.) before then, please call [phone].  If you test positive for Covid 19 in the 2 weeks post procedure, please call and report this information to [place].    If any biopsies were taken you will be contacted by phone or by letter within the next 1-3 weeks.  Please call us at [phone] if you have not heard about the biopsies in 3 weeks.    SIGNATURES/CONFIDENTIALITY: You and/or your care partner have signed paperwork which will be entered into your electronic medical record.  These signatures attest to the fact that that the information above on your After Visit Summary has been reviewed and is understood.  Full responsibility of the confidentiality of this discharge information lies with you and/or your care-partner.

## 2021-01-26 NOTE — Progress Notes (Signed)
Called to room to assist during endoscopic procedure.  Patient ID and intended procedure confirmed with present staff. Received instructions for my participation in the procedure from the performing physician.  

## 2021-01-26 NOTE — Progress Notes (Signed)
No changes to clinical history since recent office visit.

## 2021-01-28 ENCOUNTER — Telehealth: Payer: Self-pay

## 2021-01-28 NOTE — Telephone Encounter (Signed)
  Follow up Call-  Call back number 01/26/2021  Post procedure Call Back phone  # [phone]  Permission to leave phone message Yes  Some recent data might be hidden     Patient questions:  Do you have a fever, pain , or abdominal swelling? No. Pain Score  0 *  Have you tolerated food without any problems? Yes.    Have you been able to return to your normal activities? Yes.    Do you have any questions about your discharge instructions: Diet   No. Medications  No. Follow up visit  No.  Do you have questions or concerns about your Care? No.  Actions: * If pain score is 4 or above: No action needed, pain <4.  Have you developed a fever since your procedure? no  2.   Have you had an respiratory symptoms (SOB or cough) since your procedure? no  3.   Have you tested positive for COVID 19 since your procedure no  4.   Have you had any family members/close contacts diagnosed with the COVID 19 since your procedure?  no   If yes to any of these questions please route to Joylene John, RN and Joella Prince, RN

## 2021-02-02 ENCOUNTER — Encounter: Payer: Self-pay | Admitting: Gastroenterology

## 2021-02-06 ENCOUNTER — Ambulatory Visit (INDEPENDENT_AMBULATORY_CARE_PROVIDER_SITE_OTHER): Payer: Medicare Other | Admitting: Urology

## 2021-02-06 ENCOUNTER — Encounter: Payer: Self-pay | Admitting: Urology

## 2021-02-06 ENCOUNTER — Other Ambulatory Visit: Payer: Self-pay

## 2021-02-06 VITALS — BP 165/84 | HR 98

## 2021-02-06 DIAGNOSIS — N138 Other obstructive and reflux uropathy: Secondary | ICD-10-CM | POA: Diagnosis not present

## 2021-02-06 DIAGNOSIS — N401 Enlarged prostate with lower urinary tract symptoms: Secondary | ICD-10-CM | POA: Diagnosis not present

## 2021-02-06 LAB — URINALYSIS, ROUTINE W REFLEX MICROSCOPIC
Bilirubin, UA: NEGATIVE
Glucose, UA: NEGATIVE
Ketones, UA: NEGATIVE
Leukocytes,UA: NEGATIVE
Nitrite, UA: NEGATIVE
Protein,UA: NEGATIVE
Specific Gravity, UA: 1.015 (ref 1.005–1.030)
Urobilinogen, Ur: 0.2 mg/dL (ref 0.2–1.0)
pH, UA: 5.5 (ref 5.0–7.5)

## 2021-02-06 LAB — MICROSCOPIC EXAMINATION
Bacteria, UA: NONE SEEN
Epithelial Cells (non renal): NONE SEEN /hpf (ref 0–10)
Renal Epithel, UA: NONE SEEN /hpf
WBC, UA: NONE SEEN /hpf (ref 0–5)

## 2021-02-06 MED ORDER — DOXYCYCLINE HYCLATE 100 MG PO CAPS: 100.0000 mg | ORAL_CAPSULE | Freq: Two times a day (BID) | ORAL | 0 refills | Status: DC

## 2021-02-06 MED ORDER — CIPROFLOXACIN HCL 500 MG PO TABS
500.0000 mg | ORAL_TABLET | Freq: Once | ORAL | Status: AC
  Administered 2021-02-06: 500 mg via ORAL

## 2021-02-06 NOTE — Progress Notes (Signed)
Urological Symptom Review  Patient is experiencing the following symptoms: Frequent urination Burning/pain with urination Get up at night to urinate   Review of Systems  Gastrointestinal (upper)  : Nausea  Gastrointestinal (lower) : Diarrhea  Constitutional : Negative for symptoms  Skin: Negative for skin symptoms  Eyes: Negative for eye symptoms  Ear/Nose/Throat : Negative for Ear/Nose/Throat symptoms  Hematologic/Lymphatic: Negative for Hematologic/Lymphatic symptoms  Cardiovascular : Negative for cardiovascular symptoms  Respiratory : Negative for respiratory symptoms  Endocrine: Negative for endocrine symptoms  Musculoskeletal: Back pain  Neurological: Negative for neurological symptoms  Psychologic: Negative for psychiatric symptoms

## 2021-02-06 NOTE — Patient Instructions (Signed)
Benign Prostatic Hyperplasia  Benign prostatic hyperplasia (BPH) is an enlarged prostate gland that is caused by the normal aging process and not by cancer. The prostate is a walnut-sized gland that is involved in the production of semen. It is located in front of the rectum and below the bladder. The bladder stores urine and the urethra is the tube that carries the urine out of the body. The prostate may get bigger asa man gets older. An enlarged prostate can press on the urethra. This can make it harder to pass urine. The build-up of urine in the bladder can cause infection. Back pressure and infection may progress to bladder damage and kidney (renal) failure. What are the causes? This condition is part of a normal aging process. However, not all men develop problems from this condition. If the prostate enlarges away from the urethra, urine flow will not be blocked. If it enlarges toward the urethra andcompresses it, there will be problems passing urine. What increases the risk? This condition is more likely to develop in men over the age of 50 years. What are the signs or symptoms? Symptoms of this condition include: Getting up often during the night to urinate. Needing to urinate frequently during the day. Difficulty starting urine flow. Decrease in size and strength of your urine stream. Leaking (dribbling) after urinating. Inability to pass urine. This needs immediate treatment. Inability to completely empty your bladder. Pain when you pass urine. This is more common if there is also an infection. Urinary tract infection (UTI). How is this diagnosed? This condition is diagnosed based on your medical history, a physical exam, and your symptoms. Tests will also be done, such as: A post-void bladder scan. This measures any amount of urine that may remain in your bladder after you finish urinating. A digital rectal exam. In a rectal exam, your health care provider checks your prostate by  putting a lubricated, gloved finger into your rectum to feel the back of your prostate gland. This exam detects the size of your gland and any abnormal lumps or growths. An exam of your urine (urinalysis). A prostate specific antigen (PSA) screening. This is a blood test used to screen for prostate cancer. An ultrasound. This test uses sound waves to electronically produce a picture of your prostate gland. Your health care provider may refer you to a specialist in kidney and prostate diseases (urologist). How is this treated? Once symptoms begin, your health care provider will monitor your condition (active surveillance or watchful waiting). Treatment for this condition will depend on the severity of your condition. Treatment may include: Observation and yearly exams. This may be the only treatment needed if your condition and symptoms are mild. Medicines to relieve your symptoms, including: Medicines to shrink the prostate. Medicines to relax the muscle of the prostate. Surgery in severe cases. Surgery may include: Prostatectomy. In this procedure, the prostate tissue is removed completely through an open incision or with a laparoscope or robotics. Transurethral resection of the prostate (TURP). In this procedure, a tool is inserted through the opening at the tip of the penis (urethra). It is used to cut away tissue of the inner core of the prostate. The pieces are removed through the same opening of the penis. This removes the blockage. Transurethral incision (TUIP). In this procedure, small cuts are made in the prostate. This lessens the prostate's pressure on the urethra. Transurethral microwave thermotherapy (TUMT). This procedure uses microwaves to create heat. The heat destroys and removes a small   amount of prostate tissue. Transurethral needle ablation (TUNA). This procedure uses radio frequencies to destroy and remove a small amount of prostate tissue. Interstitial laser coagulation (ILC).  This procedure uses a laser to destroy and remove a small amount of prostate tissue. Transurethral electrovaporization (TUVP). This procedure uses electrodes to destroy and remove a small amount of prostate tissue. Prostatic urethral lift. This procedure inserts an implant to push the lobes of the prostate away from the urethra. Follow these instructions at home: Take over-the-counter and prescription medicines only as told by your health care provider. Monitor your symptoms for any changes. Contact your health care provider with any changes. Avoid drinking large amounts of liquid before going to bed or out in public. Avoid or reduce how much caffeine or alcohol you drink. Give yourself time when you urinate. Keep all follow-up visits as told by your health care provider. This is important. Contact a health care provider if: You have unexplained back pain. Your symptoms do not get better with treatment. You develop side effects from the medicine you are taking. Your urine becomes very dark or has a bad smell. Your lower abdomen becomes distended and you have trouble passing your urine. Get help right away if: You have a fever or chills. You suddenly cannot urinate. You feel lightheaded, or very dizzy, or you faint. There are large amounts of blood or clots in the urine. Your urinary problems become hard to manage. You develop moderate to severe low back or flank pain. The flank is the side of your body between the ribs and the hip. These symptoms may represent a serious problem that is an emergency. Do not wait to see if the symptoms will go away. Get medical help right away. Call your local emergency services (911 in the [place] Do not drive yourself to the hospital. Summary Benign prostatic hyperplasia (BPH) is an enlarged prostate that is caused by the normal aging process and not by cancer. An enlarged prostate can press on the urethra. This can make it hard to pass urine. This  condition is part of a normal aging process and is more likely to develop in men over the age of 50 years. Get help right away if you suddenly cannot urinate. This information is not intended to replace advice given to you by your health care provider. Make sure you discuss any questions you have with your healthcare provider. Document Revised: 02/21/2020 Document Reviewed: 02/21/2020 Elsevier Patient Education  2022 [employer]  

## 2021-02-06 NOTE — Progress Notes (Signed)
   02/06/21  CC: dysuria   HPI: Mr Karagiannis is a 60yo here for followup for dysuria. He has started rapaflo 2 days ago.  Blood pressure (!) 165/84, pulse 98. NED. A&Ox3.   No respiratory distress   Abd soft, NT, ND Normal phallus with bilateral descended testicles  Cystoscopy Procedure Note  Patient identification was confirmed, informed consent was obtained, and patient was prepped using Betadine solution.  Lidocaine jelly was administered per urethral meatus.     Pre-Procedure: - Inspection reveals a normal caliber ureteral meatus.  Procedure: The flexible cystoscope was introduced without difficulty - No urethral strictures/lesions are present. - Enlarged prostate edema of left lateral lobe - Normal bladder neck - Bilateral ureteral orifices identified - Bladder mucosa  reveals no ulcers, tumors, or lesions - No bladder stones - No trabeculation  Retroflexion shows no intravesical prostatic protrusion   Post-Procedure: - Patient tolerated the procedure well  Assessment/ Plan: Continue rapaflo '8mg'$  daily Doxycycline '100mg'$  BID for 28 days  No follow-ups on file.  Nicolette Bang, MD

## 2021-02-10 ENCOUNTER — Encounter: Payer: Self-pay | Admitting: General Surgery

## 2021-02-10 ENCOUNTER — Ambulatory Visit (INDEPENDENT_AMBULATORY_CARE_PROVIDER_SITE_OTHER): Payer: Medicare Other | Admitting: General Surgery

## 2021-02-10 ENCOUNTER — Other Ambulatory Visit: Payer: Self-pay

## 2021-02-10 VITALS — BP 169/86 | HR 85 | Temp 98.5°F | Resp 12 | Ht 67.0 in | Wt 151.0 lb

## 2021-02-10 DIAGNOSIS — K56699 Other intestinal obstruction unspecified as to partial versus complete obstruction: Secondary | ICD-10-CM

## 2021-02-10 MED ORDER — NEOMYCIN SULFATE 500 MG PO TABS: 1000.0000 mg | ORAL_TABLET | ORAL | 0 refills | Status: DC

## 2021-02-10 MED ORDER — OXYCODONE HCL 5 MG PO TABS: 5.0000 mg | ORAL_TABLET | ORAL | 0 refills | Status: DC | PRN

## 2021-02-10 MED ORDER — METRONIDAZOLE 500 MG PO TABS
1000.0000 mg | ORAL_TABLET | ORAL | 0 refills | Status: DC
Start: 2021-02-10 — End: 2021-03-03

## 2021-02-10 NOTE — Patient Instructions (Signed)
Colon Preparation: Buy from the Store: Miralax bottle [phone]).  Gatorade 64 oz (not red). Dulcolax tablets.   The Day Prior to Surgery: Take 4 ducolax tablets at 7am with water. Drink plenty of clear liquids all day to avoid dehydration, no solid food.    Mix the bottle of Miralax and 64 oz of Gatorade and drink this mixture starting at 10am. Drink it gradually over the next few hours, 8 ounces every 15-30 minutes until it is gone. Finish this by 2pm.  Take 2 neomycin '500mg'$  tablets and 2 metronidazole '500mg'$  tablets at 2 pm. Take 2 neomycin '500mg'$  tablets and 2 metronidazole '500mg'$  tablets at 3pm. Take 2 neomycin '500mg'$  tablets and 2 metronidazole '500mg'$  tablets at 10pm.    Do not eat or drink anything after midnight the night before your surgery.  Do not eat or drink anything that morning, and take medications as instructed by the hospital staff on your preoperative visit.    Minimally Invasive Partial Colectomy  Minimally invasive total colectomy is surgery to remove all of the large intestine, also called the colon, leaving the small intestine and the rectum. This procedure uses several small incisions in the abdomen instead of one large incision. This procedure is also called laparoscopic total abdominal colectomy. Sometimes a robot can be used to control the tools used in the surgery. In sucha case, the procedure is called robot-assisted laparoscopic surgery. This procedure may be used to treat several conditions that affect the colon, including: Inflammation (diverticulitis). Tumors or masses. Inflammatory bowel disease, such as Crohn's disease or ulcerative colitis. Bleeding. Blockage or obstruction. Tell a health care provider about: Any allergies you have. All medicines you are taking, including vitamins, herbs, eye drops, creams, and over-the-counter medicines. Any problems you or family members have had with anesthetic medicines. Any blood disorders you have. Any surgeries you  have had. Any medical conditions you have. Whether you are pregnant or may be pregnant. What are the risks? Generally, this is a safe procedure. However, problems may occur, including: Infection. Bleeding. Allergic reactions to medicines or dyes. Damage to nearby structures or organs. Leaking from where the small intestine and rectum were sewn together, if this applies. Future blockage of the small intestines from scar tissue. Another surgery may be needed to repair this. Needing to convert to an open procedure. What happens before the procedure? Medicines Ask your health care provider about: Changing or stopping your regular medicines. This is especially important if you are taking diabetes medicines or blood thinners. Taking medicines such as aspirin and ibuprofen. These medicines can thin your blood. Do not take these medicines unless your health care provider tells you to take them. Taking over-the-counter medicines, vitamins, herbs, and supplements. Surgery safety Ask your health care provider: How your surgery site will be marked. What steps will be taken to help prevent infection. These may include: Removing hair at the surgery site. Washing skin with a germ-killing soap. Taking antibiotic medicine. You may be given antibiotic medicine to clean out bacteria from your colon. Follow the directions carefully and take the medicine at the correct time. General instructions Do not use any products that contain nicotine or tobacco for at least 4 weeks before the procedure. These products include cigarettes, e-cigarettes, and chewing tobacco. If you need help quitting, ask your health care provider. Plan to have someone take you home from the hospital or clinic. You may be prescribed an oral bowel prep to clean out your colon in preparation for the surgery.  Follow instructions from your health care provider about how to do this. Do not eat or drink anything else after you have started  the bowel prep, unless your health care provider tells you it is safe to do so. What happens during the procedure?  An IV will be inserted into one of your veins. You will be given one or both of the following: A medicine to help you relax (sedative). A medicine to make you fall asleep (general anesthetic). Small monitors will be connected to your body. They will be used to check your heart, blood pressure, and oxygen level. A breathing tube may be placed into your lungs. A small, thin tube (catheter) will be placed into your bladder to drain urine. A tube may be placed through your nose and into your stomach to drain stomach fluids (nasogastric tube, or NG tube). Your abdomen will be filled with gas so it expands. This gives the surgeon more room to operate and makes your organs easier to see. Several small incisions will be made in your abdomen. A thin tube with a light and camera (laparoscope) will be inserted into one of the incisions. The camera on the laparoscope will send a picture to a computer screen in the operating room. This will give the surgeon a good view inside your abdomen. Hollow tubes will be inserted into the other small incisions in your abdomen. The tools that are needed for the procedure will be put through these tubes. A robot may also be used to control the instruments. Clamps or staples will be put on the end of the small intestine and the end of the colon, at the rectum. The part of the intestine between the clamps or staples will be removed. The end of the small intestine will be stitched (sutured) or stapled to the top of the rectum to allow your body to pass waste (stool). If the remaining intestines cannot be stitched back together, you will have a procedure called an ileostomy. This creates a new opening for stool to leave your body. If you need an ileostomy: An opening to the outside of your body (stoma) will be made through your abdomen. The end of your small  intestine will be brought to the opening. It will be sutured to the skin. A removable bag (ostomy pouch) will be attached to the opening. Stool will drain into this bag. The rectum will stay in place and will be closed with sutures or staples. The stoma may be temporary or permanent. The incisions from the colectomy will be closed with sutures or staples. The procedure may vary among health care providers and hospitals. What happens after the procedure? Your blood pressure, heart rate, breathing rate, and blood oxygen level will be monitored until you leave the hospital or clinic. You will receive fluids through an IV until your bowels start to work properly. Once your bowels are working again, you will be given clear liquids first and then solid food as tolerated. You will be given medicines to control your pain and nausea, if needed. Summary Minimally invasive total colectomy is surgery to remove all of the large intestine, or colon, from the abdomen. Tell your health care provider about all medical conditions you have and all medicines you are taking for those conditions. Follow all instructions before the procedure, including when to stop eating and drinking, and whether to change or stop any medicines. The colon will be removed using small incisions. A robot is sometimes used to do the  surgery. Your surgeon will explain to you if the small intestine will be connected to the rectum, or if you will have a stoma to help you pass waste. This information is not intended to replace advice given to you by your health care provider. Make sure you discuss any questions you have with your healthcare provider. Document Revised: 05/23/2019 Document Reviewed: 05/23/2019 Elsevier Patient Education  2022 [employer].
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
## 2021-02-17 ENCOUNTER — Telehealth: Payer: Self-pay | Admitting: General Surgery

## 2021-02-17 DIAGNOSIS — K56699 Other intestinal obstruction unspecified as to partial versus complete obstruction: Secondary | ICD-10-CM

## 2021-02-17 MED ORDER — ONDANSETRON HCL 4 MG PO TABS: 4.0000 mg | ORAL_TABLET | Freq: Three times a day (TID) | ORAL | 0 refills | Status: DC | PRN

## 2021-02-17 MED ORDER — OXYCODONE HCL 5 MG PO TABS: 5.0000 mg | ORAL_TABLET | ORAL | 0 refills | Status: DC | PRN

## 2021-02-17 NOTE — Telephone Encounter (Signed)
[facility]  Pain meds and zofran refilled. If has worsening pain or nausea will need to go to ED.  Curlene Labrum, MD [facility] [address] [place], [place] [address] [phone] (office)

## 2021-02-17 NOTE — Telephone Encounter (Signed)
Patient aware.

## 2021-02-19 ENCOUNTER — Ambulatory Visit: Payer: Medicare Other | Admitting: Family Medicine

## 2021-02-24 ENCOUNTER — Other Ambulatory Visit (INDEPENDENT_AMBULATORY_CARE_PROVIDER_SITE_OTHER): Payer: Medicare Other | Admitting: General Surgery

## 2021-02-24 DIAGNOSIS — K56699 Other intestinal obstruction unspecified as to partial versus complete obstruction: Secondary | ICD-10-CM

## 2021-02-24 MED ORDER — OXYCODONE HCL 5 MG PO TABS: 5.0000 mg | ORAL_TABLET | ORAL | 0 refills | Status: DC | PRN

## 2021-02-24 NOTE — Telephone Encounter (Signed)
[facility]  Should try to use tylenol and ibuprofen also for pain. Dr. Alyson Ingles would be available 9/19 and does not think he needs any stents or additional urology workup prior to the OR. He has his carotid doppler and Levell July NP visit next week.   Lets get him schedule for surgery 9/19 for laparoscopic partial colectomy for sigmoid stricture. He already has his bowel prep information.  Curlene Labrum, MD [facility] [address] [place], [place] [address] [phone] (office)

## 2021-02-26 ENCOUNTER — Ambulatory Visit (INDEPENDENT_AMBULATORY_CARE_PROVIDER_SITE_OTHER): Payer: Medicare Other

## 2021-02-26 ENCOUNTER — Other Ambulatory Visit: Payer: Self-pay

## 2021-02-26 DIAGNOSIS — R0989 Other specified symptoms and signs involving the circulatory and respiratory systems: Secondary | ICD-10-CM | POA: Diagnosis not present
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
## 2021-03-03 ENCOUNTER — Other Ambulatory Visit: Payer: Self-pay

## 2021-03-03 ENCOUNTER — Ambulatory Visit (INDEPENDENT_AMBULATORY_CARE_PROVIDER_SITE_OTHER): Payer: Medicare Other | Admitting: Family Medicine

## 2021-03-03 ENCOUNTER — Encounter: Payer: Self-pay | Admitting: Family Medicine

## 2021-03-03 VITALS — BP 162/90 | HR 66 | Ht 66.0 in | Wt 151.0 lb

## 2021-03-03 DIAGNOSIS — Z720 Tobacco use: Secondary | ICD-10-CM

## 2021-03-03 DIAGNOSIS — E782 Mixed hyperlipidemia: Secondary | ICD-10-CM | POA: Diagnosis not present

## 2021-03-03 DIAGNOSIS — I2510 Atherosclerotic heart disease of native coronary artery without angina pectoris: Secondary | ICD-10-CM

## 2021-03-03 DIAGNOSIS — Z01818 Encounter for other preprocedural examination: Secondary | ICD-10-CM | POA: Diagnosis not present

## 2021-03-03 DIAGNOSIS — I10 Essential (primary) hypertension: Secondary | ICD-10-CM

## 2021-03-03 DIAGNOSIS — Z79899 Other long term (current) drug therapy: Secondary | ICD-10-CM

## 2021-03-03 DIAGNOSIS — R0989 Other specified symptoms and signs involving the circulatory and respiratory systems: Secondary | ICD-10-CM

## 2021-03-03 MED ORDER — AMLODIPINE BESYLATE 10 MG PO TABS: 10.0000 mg | ORAL_TABLET | Freq: Every day | ORAL | 6 refills | Status: DC

## 2021-03-03 NOTE — Patient Instructions (Addendum)
Medication Instructions:  Increase Amlodipine to '10mg'$  daily.   Continue all other medications.     Labwork: none  Testing/Procedures: none  Follow-Up: 6 months   Any Other Special Instructions Will Be Listed Below (If Applicable). Please call the office in 1 week with blood pressure readings.    If you need a refill on your cardiac medications before your next appointment, please call your pharmacy.

## 2021-03-04 ENCOUNTER — Telehealth: Payer: Self-pay | Admitting: General Surgery

## 2021-03-04 MED ORDER — OXYCODONE HCL 5 MG PO TABS: 5.0000 mg | ORAL_TABLET | ORAL | 0 refills | Status: DC | PRN

## 2021-03-04 NOTE — Telephone Encounter (Signed)
[facility]   Alternate tylenol and ibuprofen. Roxicodone refilled. Surgery scheduled for 9/19 for sigmoid stricture.   Curlene Labrum, MD [facility] [address] [place], [place] [address] [phone] (office)

## 2021-03-04 NOTE — Telephone Encounter (Signed)
Spoke to pt and made aware of med sent to pharm. Pt states that he is already rotating ibuprofen and tylenol. Was appreciative of refill and phone call.

## 2021-03-11 NOTE — Patient Instructions (Signed)
Care order/instruction: Buy from the Store: Miralax bottle [phone]). Gatorade 64 oz (not red). Dulcolax tablets. The Day Prior to Surgery: Take 4 ducolax tablets at 7am with water. Drink plenty of clear liquids all day to avoid dehydration, no solid... (Order [id number]) Nursing Held Date: 03/03/2021 Ordering/Authorizing: Virl Cagey, MD   Pending Order Information  This order is signed and held for [place] with Virl Cagey, MD on 03/16/2021 at 10:20 AM     Virl Cagey, MD NPI: [id number]    Patient Information  Patient Name  [name], [name] Legal Sex  Male DOB  1960/07/08 SSN  [id number]   Order Information  Order Date/Time Release Date/Time Start Date/Time End Date/Time  None None 03/03/21 09:37 AM None   Order History Inpatient Date/Time Action Taken User Additional Information  03/03/21 [phone] Sign and Hold Virl Cagey, MD Reason:RN Will Release   Order Questions       Comments  Buy from the Store:  Miralax bottle [phone]).  Gatorade 64 oz (not red).  Dulcolax tablets.   The Day Prior to Surgery:  Take 4 ducolax tablets at 7am with water.  Drink plenty of clear liquids all day to avoid dehydration, no solid food.     Mix the bottle of Miralax and 64 oz of Gatorade and drink this mixture starting at 10am. Drink it gradually over the next few hours, 8 ounces every 15-30 minutes until it is gone. Finish this by 2pm.  Take 2 neomycin '500mg'$  tablets and 2 metronidazole '500mg'$  tablets at 2 pm.  Take 2 neomycin '500mg'$  tablets and 2 metronidazole '500mg'$  tablets at 3pm.  Take 2 neomycin '500mg'$  tablets and 2 metronidazole '500mg'$  tablets at 10pm.  Drink 2 carb drinks before bed 03/15/2021.    You may have clear liquids until 0630 am 03/16/2021. At 0630 am, 9/19, drink 1 carb drink. You may nit have anything else to drink after this except for the medications told to you by the preoperative staff.   JJESUS HOGGLE  03/11/2021      '@PREFPERIOPPHARMACY'$ @   Your procedure is scheduled on 03/16/2021.   Report to [facility] at  0900 A.M.   Call this number if you have problems the morning of surgery:  [phone]   Remember:  Do not eat after midnight.  You may drink clear liquids until  0630 AM.  At 0630 AM, drink 1 carb drink and then nothing else to drink after this.  Clear liquids allowed are:                    Water, Juice (non-citric and without pulp - diabetics please choose diet or no sugar options), Carbonated beverages - (diabetics please choose diet or no sugar options), Clear Tea, Black Coffee only (no creamer, milk or cream including half and half), Plain Jell-O only (diabetics please choose diet or no sugar options), Gatorade (diabetics please choose diet or no sugar options), and Plain Popsicles only    Take these medicines the morning of surgery with A SIP OF WATER                  amlodipine, metoprolol, oxycodone (if needed).     Do not wear jewelry, make-up or nail polish.  Do not wear lotions, powders, or perfumes, or deodorant.  Do not shave 48 hours prior to surgery.  Men may shave face and neck.  Do not bring valuables to the hospital.  [facility] is  not responsible for any belongings or valuables.  Contacts, dentures or bridgework may not be worn into surgery.  Leave your suitcase in the car.  After surgery it may be brought to your room.  For patients admitted to the hospital, discharge time will be determined by your treatment team.  Patients discharged the day of surgery will not be allowed to drive home.    Special instructions:   DO NOT smoke tobacco or vape fore 24 hours before your procedure.  Please read over the following fact sheets that you were given. Pain Booklet, Coughing and Deep Breathing, Blood Transfusion Information, Surgical Site Infection Prevention, Anesthesia Post-op Instructions, and Care and Recovery After Surgery              Minimally Invasive  Total Colectomy, Care After This sheet gives you information about how to care for yourself after your procedure. Your health care provider may also give you more specific instructions. If you have problems or questions, contact your health care provider. What can I expect after the procedure? After your procedure, it is common to have the following: Pain in your abdomen, especially in the incision areas. You will be given medicine to control the pain. Tiredness. This is a normal part of the recovery process. Your energy level will return to normal over the next several weeks. Changes in your bowel movements, especially having bowel movements more often. Talk with your health care provider about how to manage this. Follow these instructions at home: Medicines Take over-the-counter and prescription medicines only as told by your health care provider. If you were prescribed an antibiotic medicine, take it as told by your health care provider. Do not stop using the antibiotic even if you start to feel better. Ask your health care provider if the medicine prescribed to you requires you to avoid driving or using machinery. Eating and drinking Follow instructions from your health care provider about what you can eat after surgery. You may be asked to begin with a diet that is low in fiber. Do not drink alcohol if: Your health care provider tells you not to drink. You are pregnant, may be pregnant, or are planning to become pregnant. If you drink alcohol: Limit how much you use to: 0-1 drink a day for women. 0-2 drinks a day for men. Be aware of how much alcohol is in your drink. In the [place] one drink equals one 12 oz bottle of beer (355 mL), one 5 oz glass of wine (148 mL), or one 1 oz glass of hard liquor (44 mL). Incision care  Follow instructions from your health care provider about how to take care of your incisions. Make sure you: Wash your hands with soap and water for at least 20 seconds  before and after applying medicine to the area, and before and after changing your bandage (dressing). If soap and water are not available, use hand sanitizer. Change your dressing as told by your health care provider. Leave stitches (sutures) or staples in place. These skin closures may need to stay in place for 2 weeks or longer. If adhesive strip edges start to loosen and curl up, you may trim the loose edges. Do not remove adhesive strips completely unless your health care provider tells you to do that. Keep your incisions clean and dry. Do not wear tight clothing over the incisions. Tight clothing may rub and irritate the incision areas, which may cause the incisions to open. Do not take baths, swim, or  use a hot tub until your health care provider approves. Ask your health care provider if you may take showers. You may only be allowed to take sponge baths. Check your incision area every day for signs of infection. Check for: More redness, swelling, or pain. Fluid or blood. Warmth. Pus or a bad smell. Activity Rest as told by your health care provider. Avoid sitting for a long time without moving. Get up to take short walks every 1-2 hours. This is important to improve blood flow and breathing. Ask for help if you feel weak or unsteady. Do not lift anything that is heavier than 10 lb (4.5 kg), or the limit that you are told, until your health care provider says that it is safe. Return to your normal activities as told by your health care provider. Ask your health care provider what activities are safe for you. General instructions Keep all follow-up visits as told by your health care provider. This is important. You may need a follow-up visit to remove sutures or staples. Contact a health care provider if: You have more redness, swelling, or pain around your incisions. You have fluid or blood coming from the incisions. Your incisions feel warm to the touch. You have pus or a bad smell  coming from your incisions or your dressing. You have a fever. Your incisions break open after sutures or staples have been removed. Get help right away if: You develop a rash. You have chest pain or difficulty breathing. You have pain or swelling in your legs. You feel light-headed or you faint. Your abdomen swells (becomes distended). You have nausea or vomiting. You have blood in your stool. Summary After the procedure, it is common to have pain in the abdomen, tiredness, or changes in bowel movements. Follow instructions from your health care provider about how to care for your incisions. Begin your diet with low-fiber foods. Your health care provider will tell you when to resume your regular diet. Contact a health care provider if you have any signs of infection, such as a fever, more redness, swelling, or pain around your incisions, or a bad smell coming from your incisions. Get help right away if you have a rash, nausea, chest pain, pain or swelling in your legs, or blood in your stool. This information is not intended to replace advice given to you by your health care provider. Make sure you discuss any questions you have with your health care provider. Document Revised: 05/23/2019 Document Reviewed: 05/23/2019 Elsevier Patient Education  2022 [place] Anesthesia, Adult, Care After This sheet gives you information about how to care for yourself after your procedure. Your health care provider may also give you more specific instructions. If you have problems or questions, contact your health care provider. What can I expect after the procedure? After the procedure, the following side effects are common: Pain or discomfort at the IV site. Nausea. Vomiting. Sore throat. Trouble concentrating. Feeling cold or chills. Feeling weak or tired. Sleepiness and fatigue. Soreness and body aches. These side effects can affect parts of the body that were not involved in  surgery. Follow these instructions at home: For the time period you were told by your health care provider:  Rest. Do not participate in activities where you could fall or become injured. Do not drive or use machinery. Do not drink alcohol. Do not take sleeping pills or medicines that cause drowsiness. Do not make important decisions or sign legal documents. Do not take care  of children on your own. Eating and drinking Follow any instructions from your health care provider about eating or drinking restrictions. When you feel hungry, start by eating small amounts of foods that are soft and easy to digest (bland), such as toast. Gradually return to your regular diet. Drink enough fluid to keep your urine pale yellow. If you vomit, rehydrate by drinking water, juice, or clear broth. General instructions If you have sleep apnea, surgery and certain medicines can increase your risk for breathing problems. Follow instructions from your health care provider about wearing your sleep device: Anytime you are sleeping, including during daytime naps. While taking prescription pain medicines, sleeping medicines, or medicines that make you drowsy. Have a responsible adult stay with you for the time you are told. It is important to have someone help care for you until you are awake and alert. Return to your normal activities as told by your health care provider. Ask your health care provider what activities are safe for you. Take over-the-counter and prescription medicines only as told by your health care provider. If you smoke, do not smoke without supervision. Keep all follow-up visits as told by your health care provider. This is important. Contact a health care provider if: You have nausea or vomiting that does not get better with medicine. You cannot eat or drink without vomiting. You have pain that does not get better with medicine. You are unable to pass urine. You develop a skin rash. You  have a fever. You have redness around your IV site that gets worse. Get help right away if: You have difficulty breathing. You have chest pain. You have blood in your urine or stool, or you vomit blood. Summary After the procedure, it is common to have a sore throat or nausea. It is also common to feel tired. Have a responsible adult stay with you for the time you are told. It is important to have someone help care for you until you are awake and alert. When you feel hungry, start by eating small amounts of foods that are soft and easy to digest (bland), such as toast. Gradually return to your regular diet. Drink enough fluid to keep your urine pale yellow. Return to your normal activities as told by your health care provider. Ask your health care provider what activities are safe for you. This information is not intended to replace advice given to you by your health care provider. Make sure you discuss any questions you have with your health care provider. Document Revised: 02/28/2020 Document Reviewed: 09/27/2019 Elsevier Patient Education  2022 [place]. How to Use Chlorhexidine for Bathing Chlorhexidine gluconate (CHG) is a germ-killing (antiseptic) solution that is used to clean the skin. It can get rid of the bacteria that normally live on the skin and can keep them away for about 24 hours. To clean your skin with CHG, you may be given: A CHG solution to use in the shower or as part of a sponge bath. A prepackaged cloth that contains CHG. Cleaning your skin with CHG may help lower the risk for infection: While you are staying in the intensive care unit of the hospital. If you have a vascular access, such as a central line, to provide short-term or long-term access to your veins. If you have a catheter to drain urine from your bladder. If you are on a ventilator. A ventilator is a machine that helps you breathe by moving air in and out of your lungs. After surgery.  What are the  risks? Risks of using CHG include: A skin reaction. Hearing loss, if CHG gets in your ears and you have a perforated eardrum. Eye injury, if CHG gets in your eyes and is not rinsed out. The CHG product catching fire. Make sure that you avoid smoking and flames after applying CHG to your skin. Do not use CHG: If you have a chlorhexidine allergy or have previously reacted to chlorhexidine. On babies younger than 81 months of age. How to use CHG solution Use CHG only as told by your health care provider, and follow the instructions on the label. Use the full amount of CHG as directed. Usually, this is one bottle. During a shower Follow these steps when using CHG solution during a shower (unless your health care provider gives you different instructions): Start the shower. Use your normal soap and shampoo to wash your face and hair. Turn off the shower or move out of the shower stream. Pour the CHG onto a clean washcloth. Do not use any type of brush or rough-edged sponge. Starting at your neck, lather your body down to your toes. Make sure you follow these instructions: If you will be having surgery, pay special attention to the part of your body where you will be having surgery. Scrub this area for at least 1 minute. Do not use CHG on your head or face. If the solution gets into your ears or eyes, rinse them well with water. Avoid your genital area. Avoid any areas of skin that have broken skin, cuts, or scrapes. Scrub your back and under your arms. Make sure to wash skin folds. Let the lather sit on your skin for 1-2 minutes or as long as told by your health care provider. Thoroughly rinse your entire body in the shower. Make sure that all body creases and crevices are rinsed well. Dry off with a clean towel. Do not put any substances on your body afterward--such as powder, lotion, or perfume--unless you are told to do so by your health care provider. Only use lotions that are recommended by  the manufacturer. Put on clean clothes or pajamas. If it is the night before your surgery, sleep in clean sheets.  During a sponge bath Follow these steps when using CHG solution during a sponge bath (unless your health care provider gives you different instructions): Use your normal soap and shampoo to wash your face and hair. Pour the CHG onto a clean washcloth. Starting at your neck, lather your body down to your toes. Make sure you follow these instructions: If you will be having surgery, pay special attention to the part of your body where you will be having surgery. Scrub this area for at least 1 minute. Do not use CHG on your head or face. If the solution gets into your ears or eyes, rinse them well with water. Avoid your genital area. Avoid any areas of skin that have broken skin, cuts, or scrapes. Scrub your back and under your arms. Make sure to wash skin folds. Let the lather sit on your skin for 1-2 minutes or as long as told by your health care provider. Using a different clean, wet washcloth, thoroughly rinse your entire body. Make sure that all body creases and crevices are rinsed well. Dry off with a clean towel. Do not put any substances on your body afterward--such as powder, lotion, or perfume--unless you are told to do so by your health care provider. Only use lotions that are recommended  by the manufacturer. Put on clean clothes or pajamas. If it is the night before your surgery, sleep in clean sheets. How to use CHG prepackaged cloths Only use CHG cloths as told by your health care provider, and follow the instructions on the label. Use the CHG cloth on clean, dry skin. Do not use the CHG cloth on your head or face unless your health care provider tells you to. When washing with the CHG cloth: Avoid your genital area. Avoid any areas of skin that have broken skin, cuts, or scrapes. Before surgery Follow these steps when using a CHG cloth to clean before surgery  (unless your health care provider gives you different instructions): Using the CHG cloth, vigorously scrub the part of your body where you will be having surgery. Scrub using a back-and-forth motion for 3 minutes. The area on your body should be completely wet with CHG when you are done scrubbing. Do not rinse. Discard the cloth and let the area air-dry. Do not put any substances on the area afterward, such as powder, lotion, or perfume. Put on clean clothes or pajamas. If it is the night before your surgery, sleep in clean sheets.  For general bathing Follow these steps when using CHG cloths for general bathing (unless your health care provider gives you different instructions). Use a separate CHG cloth for each area of your body. Make sure you wash between any folds of skin and between your fingers and toes. Wash your body in the following order, switching to a new cloth after each step: The front of your neck, shoulders, and chest. Both of your arms, under your arms, and your hands. Your stomach and groin area, avoiding the genitals. Your right leg and foot. Your left leg and foot. The back of your neck, your back, and your buttocks. Do not rinse. Discard the cloth and let the area air-dry. Do not put any substances on your body afterward--such as powder, lotion, or perfume--unless you are told to do so by your health care provider. Only use lotions that are recommended by the manufacturer. Put on clean clothes or pajamas. Contact a health care provider if: Your skin gets irritated after scrubbing. You have questions about using your solution or cloth. You swallow any chlorhexidine. Call your local poison control center (1-(416)831-2909 in the [place] Get help right away if: Your eyes itch badly, or they become very red or swollen. Your skin itches badly and is red or swollen. Your hearing changes. You have trouble seeing. You have swelling or tingling in your mouth or throat. You have  trouble breathing. These symptoms may represent a serious problem that is an emergency. Do not wait to see if the symptoms will go away. Get medical help right away. Call your local emergency services (911 in the [place] Do not drive yourself to the hospital. Summary Chlorhexidine gluconate (CHG) is a germ-killing (antiseptic) solution that is used to clean the skin. Cleaning your skin with CHG may help to lower your risk for infection. You may be given CHG to use for bathing. It may be in a bottle or in a prepackaged cloth to use on your skin. Carefully follow your health care provider's instructions and the instructions on the product label. Do not use CHG if you have a chlorhexidine allergy. Contact your health care provider if your skin gets irritated after scrubbing. This information is not intended to replace advice given to you by your health care provider. Make sure you discuss any  questions you have with your health care provider. Document Revised: 08/25/2020 Document Reviewed: 08/25/2020 Elsevier Patient Education  2022 [place]. medications as instructed by the hospital staff on your preoperative visit.          Reference Links          Standing Order Information  Remaining Occurrences Interval     1/1 Once               Care order/instruction: Buy from the Store: Miralax bottle (288g). Gatorade 64 oz (not red). Dulcolax tablets. The Day Prior to Surgery: Take 4 ducolax tablets at 7am with water. Drink plenty of clear liquids all day to avoid dehydration, no solid...: Patient Communication   Not Released  Not seen     Collection Information          Encounter  View Encounter            Tracking Links  Cosign Tracking Order Transmittal Tracking

## 2021-03-13 ENCOUNTER — Other Ambulatory Visit: Payer: Self-pay

## 2021-03-13 ENCOUNTER — Encounter (HOSPITAL_COMMUNITY): Payer: Self-pay

## 2021-03-13 ENCOUNTER — Encounter (HOSPITAL_COMMUNITY)
Admission: RE | Admit: 2021-03-13 | Discharge: 2021-03-13 | Disposition: A | Discharge status: Home / Self Care | Payer: Medicare Other | Source: Ambulatory Visit | Attending: General Surgery | Admitting: General Surgery

## 2021-03-13 DIAGNOSIS — Z20822 Contact with and (suspected) exposure to covid-19: Secondary | ICD-10-CM | POA: Insufficient documentation

## 2021-03-13 DIAGNOSIS — K56699 Other intestinal obstruction unspecified as to partial versus complete obstruction: Secondary | ICD-10-CM

## 2021-03-13 DIAGNOSIS — Z01812 Encounter for preprocedural laboratory examination: Secondary | ICD-10-CM | POA: Insufficient documentation

## 2021-03-13 DIAGNOSIS — Z79899 Other long term (current) drug therapy: Secondary | ICD-10-CM | POA: Insufficient documentation

## 2021-03-13 LAB — CBC WITH DIFFERENTIAL/PLATELET
Abs Immature Granulocytes: 0.01 10*3/uL (ref 0.00–0.07)
Basophils Absolute: 0 10*3/uL (ref 0.0–0.1)
Basophils Relative: 1 %
Eosinophils Absolute: 0.1 10*3/uL (ref 0.0–0.5)
Eosinophils Relative: 1 %
HCT: 41.8 % (ref 39.0–52.0)
Hemoglobin: 15.1 g/dL (ref 13.0–17.0)
Immature Granulocytes: 0 %
Lymphocytes Relative: 29 %
Lymphs Abs: 1.7 10*3/uL (ref 0.7–4.0)
MCH: 36.7 pg — ABNORMAL HIGH (ref 26.0–34.0)
MCHC: 36.1 g/dL — ABNORMAL HIGH (ref 30.0–36.0)
MCV: 101.5 fL — ABNORMAL HIGH (ref 80.0–100.0)
Monocytes Absolute: 0.3 10*3/uL (ref 0.1–1.0)
Monocytes Relative: 6 %
Neutro Abs: 3.8 10*3/uL (ref 1.7–7.7)
Neutrophils Relative %: 63 %
Platelets: 204 10*3/uL (ref 150–400)
RBC: 4.12 MIL/uL — ABNORMAL LOW (ref 4.22–5.81)
RDW: 12.3 % (ref 11.5–15.5)
WBC: 5.9 10*3/uL (ref 4.0–10.5)
nRBC: 0 % (ref 0.0–0.2)

## 2021-03-13 LAB — SARS CORONAVIRUS 2 (TAT 6-24 HRS): SARS Coronavirus 2: NEGATIVE

## 2021-03-13 LAB — BASIC METABOLIC PANEL
Anion gap: 8 (ref 5–15)
BUN: 7 mg/dL — ABNORMAL LOW (ref 8–23)
CO2: 25 mmol/L (ref 22–32)
Calcium: 9.2 mg/dL (ref 8.9–10.3)
Chloride: 103 mmol/L (ref 98–111)
Creatinine, Ser: 0.79 mg/dL (ref 0.61–1.24)
GFR, Estimated: >60 mL/min (ref >60)
Glucose, Bld: 100 mg/dL — ABNORMAL HIGH (ref 70–99)
Potassium: 3.8 mmol/L (ref 3.5–5.1)
Sodium: 136 mmol/L (ref 135–145)

## 2021-03-13 LAB — TYPE AND SCREEN
ABO/RH(D): A POS
Antibody Screen: NEGATIVE

## 2021-03-13 LAB — PREPARE RBC (CROSSMATCH)

## 2021-03-13 LAB — HEMOGLOBIN A1C
Hgb A1c MFr Bld: 5.3 % (ref 4.8–5.6)
Mean Plasma Glucose: 105.41 mg/dL

## 2021-03-16 ENCOUNTER — Inpatient Hospital Stay (HOSPITAL_COMMUNITY)
Admission: RE | Admit: 2021-03-16 | Discharge: 2021-03-19 | DRG: 654 | Disposition: A | Discharge status: Home / Self Care | Payer: Medicare Other | Source: Ambulatory Visit | Attending: General Surgery | Admitting: General Surgery

## 2021-03-16 ENCOUNTER — Encounter (HOSPITAL_COMMUNITY): Payer: Self-pay | Admitting: General Surgery

## 2021-03-16 ENCOUNTER — Encounter (HOSPITAL_COMMUNITY)
Admission: RE | Disposition: A | Discharge status: Home / Self Care | Payer: Self-pay | Source: Ambulatory Visit | Attending: General Surgery

## 2021-03-16 ENCOUNTER — Inpatient Hospital Stay (HOSPITAL_COMMUNITY): Payer: Medicare Other | Admitting: Anesthesiology

## 2021-03-16 ENCOUNTER — Other Ambulatory Visit: Payer: Self-pay

## 2021-03-16 DIAGNOSIS — K5900 Constipation, unspecified: Secondary | ICD-10-CM | POA: Diagnosis present

## 2021-03-16 DIAGNOSIS — I2510 Atherosclerotic heart disease of native coronary artery without angina pectoris: Secondary | ICD-10-CM | POA: Diagnosis present

## 2021-03-16 DIAGNOSIS — N321 Vesicointestinal fistula: Principal | ICD-10-CM | POA: Diagnosis present

## 2021-03-16 DIAGNOSIS — Z20822 Contact with and (suspected) exposure to covid-19: Secondary | ICD-10-CM | POA: Diagnosis present

## 2021-03-16 DIAGNOSIS — F17210 Nicotine dependence, cigarettes, uncomplicated: Secondary | ICD-10-CM | POA: Diagnosis present

## 2021-03-16 DIAGNOSIS — I252 Old myocardial infarction: Secondary | ICD-10-CM | POA: Diagnosis not present

## 2021-03-16 DIAGNOSIS — Z886 Allergy status to analgesic agent status: Secondary | ICD-10-CM

## 2021-03-16 DIAGNOSIS — F319 Bipolar disorder, unspecified: Secondary | ICD-10-CM | POA: Diagnosis present

## 2021-03-16 DIAGNOSIS — Z8249 Family history of ischemic heart disease and other diseases of the circulatory system: Secondary | ICD-10-CM

## 2021-03-16 DIAGNOSIS — Z5331 Laparoscopic surgical procedure converted to open procedure: Secondary | ICD-10-CM

## 2021-03-16 DIAGNOSIS — Z79899 Other long term (current) drug therapy: Secondary | ICD-10-CM | POA: Diagnosis not present

## 2021-03-16 DIAGNOSIS — K56699 Other intestinal obstruction unspecified as to partial versus complete obstruction: Secondary | ICD-10-CM | POA: Diagnosis not present

## 2021-03-16 DIAGNOSIS — K219 Gastro-esophageal reflux disease without esophagitis: Secondary | ICD-10-CM | POA: Diagnosis present

## 2021-03-16 DIAGNOSIS — E785 Hyperlipidemia, unspecified: Secondary | ICD-10-CM | POA: Diagnosis present

## 2021-03-16 DIAGNOSIS — Z888 Allergy status to other drugs, medicaments and biological substances status: Secondary | ICD-10-CM

## 2021-03-16 DIAGNOSIS — Z7982 Long term (current) use of aspirin: Secondary | ICD-10-CM

## 2021-03-16 DIAGNOSIS — Z885 Allergy status to narcotic agent status: Secondary | ICD-10-CM | POA: Diagnosis not present

## 2021-03-16 DIAGNOSIS — Z955 Presence of coronary angioplasty implant and graft: Secondary | ICD-10-CM

## 2021-03-16 DIAGNOSIS — F419 Anxiety disorder, unspecified: Secondary | ICD-10-CM | POA: Diagnosis present

## 2021-03-16 DIAGNOSIS — Z818 Family history of other mental and behavioral disorders: Secondary | ICD-10-CM | POA: Diagnosis not present

## 2021-03-16 DIAGNOSIS — K5792 Diverticulitis of intestine, part unspecified, without perforation or abscess without bleeding: Secondary | ICD-10-CM | POA: Diagnosis present

## 2021-03-16 DIAGNOSIS — I10 Essential (primary) hypertension: Secondary | ICD-10-CM | POA: Diagnosis present

## 2021-03-16 HISTORY — PX: LAPAROSCOPIC PARTIAL COLECTOMY: SHX5907

## 2021-03-16 HISTORY — PX: CYSTOSTOMY: SHX155

## 2021-03-16 LAB — ABO/RH: ABO/RH(D): A POS

## 2021-03-16 SURGERY — LAPAROSCOPIC PARTIAL COLECTOMY
Anesthesia: General | Site: Abdomen | Laterality: Left

## 2021-03-16 MED ORDER — ONDANSETRON HCL 4 MG/2ML IJ SOLN
INTRAMUSCULAR | Status: AC
  Filled 2021-03-16: qty 2

## 2021-03-16 MED ORDER — SCOPOLAMINE 1 MG/3DAYS TD PT72
MEDICATED_PATCH | TRANSDERMAL | Status: AC
  Filled 2021-03-16: qty 1

## 2021-03-16 MED ORDER — SODIUM CHLORIDE (PF) 0.9 % IJ SOLN
INTRAMUSCULAR | Status: AC
  Filled 2021-03-16: qty 20

## 2021-03-16 MED ORDER — PROPOFOL 10 MG/ML IV BOLUS
INTRAVENOUS | Status: AC
  Filled 2021-03-16: qty 40

## 2021-03-16 MED ORDER — HEPARIN SODIUM (PORCINE) 5000 UNIT/ML IJ SOLN
5000.0000 [IU] | Freq: Once | INTRAMUSCULAR | Status: AC
  Administered 2021-03-16: 5000 [IU] via SUBCUTANEOUS
  Filled 2021-03-16: qty 1

## 2021-03-16 MED ORDER — 0.9 % SODIUM CHLORIDE (POUR BTL) OPTIME
TOPICAL | Status: DC | PRN
  Administered 2021-03-16 (×4): 1000 mL

## 2021-03-16 MED ORDER — METHYLPREDNISOLONE SODIUM SUCC 125 MG IJ SOLR
INTRAMUSCULAR | Status: AC
  Filled 2021-03-16: qty 2

## 2021-03-16 MED ORDER — FENTANYL CITRATE (PF) 250 MCG/5ML IJ SOLN
INTRAMUSCULAR | Status: AC
  Filled 2021-03-16: qty 5

## 2021-03-16 MED ORDER — SIMETHICONE 80 MG PO CHEW: 40.0000 mg | CHEWABLE_TABLET | Freq: Four times a day (QID) | ORAL | Status: DC | PRN

## 2021-03-16 MED ORDER — ACETAMINOPHEN 500 MG PO TABS
1000.0000 mg | ORAL_TABLET | Freq: Four times a day (QID) | ORAL | Status: DC
  Administered 2021-03-16 – 2021-03-19 (×10): 1000 mg via ORAL
  Filled 2021-03-16 (×11): qty 2

## 2021-03-16 MED ORDER — CHLORHEXIDINE GLUCONATE 0.12 % MT SOLN: 15.0000 mL | Freq: Once | OROMUCOSAL | Status: DC

## 2021-03-16 MED ORDER — KETOROLAC TROMETHAMINE 30 MG/ML IJ SOLN
INTRAMUSCULAR | Status: AC
  Filled 2021-03-16: qty 2

## 2021-03-16 MED ORDER — DIPHENHYDRAMINE HCL 50 MG/ML IJ SOLN: 12.5000 mg | Freq: Four times a day (QID) | INTRAMUSCULAR | Status: DC | PRN

## 2021-03-16 MED ORDER — KETOROLAC TROMETHAMINE 15 MG/ML IJ SOLN
15.0000 mg | Freq: Four times a day (QID) | INTRAMUSCULAR | Status: AC
  Administered 2021-03-16: 15 mg via INTRAVENOUS

## 2021-03-16 MED ORDER — SEVOFLURANE IN SOLN
RESPIRATORY_TRACT | Status: AC
  Filled 2021-03-16: qty 500

## 2021-03-16 MED ORDER — DEXAMETHASONE SODIUM PHOSPHATE 10 MG/ML IJ SOLN
INTRAMUSCULAR | Status: AC
  Filled 2021-03-16: qty 1

## 2021-03-16 MED ORDER — SCOPOLAMINE 1 MG/3DAYS TD PT72
MEDICATED_PATCH | TRANSDERMAL | Status: DC | PRN
  Administered 2021-03-16: 1 via TRANSDERMAL

## 2021-03-16 MED ORDER — MORPHINE SULFATE (PF) 2 MG/ML IV SOLN
2.0000 mg | INTRAVENOUS | Status: DC | PRN
  Administered 2021-03-16 – 2021-03-17 (×6): 2 mg via INTRAVENOUS
  Filled 2021-03-16 (×6): qty 1

## 2021-03-16 MED ORDER — METOCLOPRAMIDE HCL 5 MG/ML IJ SOLN: 10.0000 mg | Freq: Four times a day (QID) | INTRAMUSCULAR | Status: DC | PRN

## 2021-03-16 MED ORDER — ENSURE SURGERY PO LIQD
237.0000 mL | Freq: Two times a day (BID) | ORAL | Status: DC
  Administered 2021-03-16: 237 mL via ORAL

## 2021-03-16 MED ORDER — PROPOFOL 10 MG/ML IV BOLUS
INTRAVENOUS | Status: AC
  Filled 2021-03-16: qty 20

## 2021-03-16 MED ORDER — ENSURE PRE-SURGERY PO LIQD: 592.0000 mL | Freq: Once | ORAL | Status: DC

## 2021-03-16 MED ORDER — HEPARIN SODIUM (PORCINE) 5000 UNIT/ML IJ SOLN
5000.0000 [IU] | Freq: Three times a day (TID) | INTRAMUSCULAR | Status: DC
  Administered 2021-03-17 – 2021-03-18 (×3): 5000 [IU] via SUBCUTANEOUS
  Filled 2021-03-16 (×3): qty 1

## 2021-03-16 MED ORDER — ENSURE PRE-SURGERY PO LIQD: 296.0000 mL | Freq: Once | ORAL | Status: DC

## 2021-03-16 MED ORDER — LACTATED RINGERS IV SOLN: INTRAVENOUS | Status: DC

## 2021-03-16 MED ORDER — ONDANSETRON HCL 4 MG/2ML IJ SOLN: 4.0000 mg | Freq: Once | INTRAMUSCULAR | Status: DC | PRN

## 2021-03-16 MED ORDER — MIDAZOLAM HCL 2 MG/2ML IJ SOLN
INTRAMUSCULAR | Status: DC | PRN
  Administered 2021-03-16 (×2): 2 mg via INTRAVENOUS

## 2021-03-16 MED ORDER — PHENYLEPHRINE HCL-NACL 20-0.9 MG/250ML-% IV SOLN
INTRAVENOUS | Status: AC
  Filled 2021-03-16: qty 250

## 2021-03-16 MED ORDER — ACETAMINOPHEN 500 MG PO TABS
1000.0000 mg | ORAL_TABLET | ORAL | Status: AC
  Administered 2021-03-16: 1000 mg via ORAL
  Filled 2021-03-16: qty 2

## 2021-03-16 MED ORDER — PROPOFOL 500 MG/50ML IV EMUL
INTRAVENOUS | Status: DC | PRN
Start: 2021-03-16 — End: 2021-03-16
  Administered 2021-03-16 (×2): 25 ug/kg/min via INTRAVENOUS

## 2021-03-16 MED ORDER — ONDANSETRON HCL 4 MG PO TABS: 4.0000 mg | ORAL_TABLET | Freq: Four times a day (QID) | ORAL | Status: DC | PRN

## 2021-03-16 MED ORDER — PHENYLEPHRINE 40 MCG/ML (10ML) SYRINGE FOR IV PUSH (FOR BLOOD PRESSURE SUPPORT)
PREFILLED_SYRINGE | INTRAVENOUS | Status: AC
  Filled 2021-03-16: qty 10

## 2021-03-16 MED ORDER — ALUM & MAG HYDROXIDE-SIMETH 200-200-20 MG/5ML PO SUSP: 30.0000 mL | Freq: Four times a day (QID) | ORAL | Status: DC | PRN

## 2021-03-16 MED ORDER — METOPROLOL SUCCINATE ER 50 MG PO TB24
50.0000 mg | ORAL_TABLET | Freq: Every day | ORAL | Status: DC
  Administered 2021-03-17 – 2021-03-19 (×3): 50 mg via ORAL
  Filled 2021-03-16 (×3): qty 1

## 2021-03-16 MED ORDER — ALVIMOPAN 12 MG PO CAPS
12.0000 mg | ORAL_CAPSULE | Freq: Two times a day (BID) | ORAL | Status: DC
  Administered 2021-03-17 – 2021-03-18 (×3): 12 mg via ORAL
  Filled 2021-03-16 (×3): qty 1

## 2021-03-16 MED ORDER — CHLORHEXIDINE GLUCONATE CLOTH 2 % EX PADS: 6.0000 | MEDICATED_PAD | Freq: Once | CUTANEOUS | Status: DC

## 2021-03-16 MED ORDER — MIDAZOLAM HCL 2 MG/2ML IJ SOLN
INTRAMUSCULAR | Status: AC
  Filled 2021-03-16: qty 2

## 2021-03-16 MED ORDER — METOPROLOL TARTRATE 5 MG/5ML IV SOLN
INTRAVENOUS | Status: DC | PRN
  Administered 2021-03-16: 3 mg via INTRAVENOUS
  Administered 2021-03-16: 2 mg via INTRAVENOUS

## 2021-03-16 MED ORDER — HYDROMORPHONE HCL 1 MG/ML IJ SOLN
0.2500 mg | INTRAMUSCULAR | Status: DC | PRN
  Administered 2021-03-16 (×3): 0.5 mg via INTRAVENOUS
  Filled 2021-03-16 (×2): qty 0.5

## 2021-03-16 MED ORDER — SUGAMMADEX SODIUM 500 MG/5ML IV SOLN
INTRAVENOUS | Status: DC | PRN
  Administered 2021-03-16: 200 mg via INTRAVENOUS

## 2021-03-16 MED ORDER — ROCURONIUM BROMIDE 100 MG/10ML IV SOLN
INTRAVENOUS | Status: DC | PRN
  Administered 2021-03-16: 20 mg via INTRAVENOUS
  Administered 2021-03-16: 50 mg via INTRAVENOUS

## 2021-03-16 MED ORDER — SUCCINYLCHOLINE CHLORIDE 200 MG/10ML IV SOSY
PREFILLED_SYRINGE | INTRAVENOUS | Status: DC | PRN
  Administered 2021-03-16: 120 mg via INTRAVENOUS

## 2021-03-16 MED ORDER — GABAPENTIN 300 MG PO CAPS
300.0000 mg | ORAL_CAPSULE | Freq: Two times a day (BID) | ORAL | Status: DC
  Administered 2021-03-16 – 2021-03-19 (×7): 300 mg via ORAL
  Filled 2021-03-16 (×7): qty 1

## 2021-03-16 MED ORDER — KETOROLAC TROMETHAMINE 15 MG/ML IJ SOLN: 15.0000 mg | Freq: Four times a day (QID) | INTRAMUSCULAR | Status: DC | PRN

## 2021-03-16 MED ORDER — NITROGLYCERIN 0.4 MG SL SUBL: 0.4000 mg | SUBLINGUAL_TABLET | SUBLINGUAL | Status: DC | PRN

## 2021-03-16 MED ORDER — BUPIVACAINE LIPOSOME 1.3 % IJ SUSP
INTRAMUSCULAR | Status: DC | PRN
  Administered 2021-03-16: 20 mL

## 2021-03-16 MED ORDER — FENTANYL CITRATE (PF) 100 MCG/2ML IJ SOLN
INTRAMUSCULAR | Status: DC | PRN
  Administered 2021-03-16: 100 ug via INTRAVENOUS
  Administered 2021-03-16: 50 ug via INTRAVENOUS
  Administered 2021-03-16: 100 ug via INTRAVENOUS
  Administered 2021-03-16: 50 ug via INTRAVENOUS
  Administered 2021-03-16 (×2): 100 ug via INTRAVENOUS

## 2021-03-16 MED ORDER — METOPROLOL TARTRATE 5 MG/5ML IV SOLN
INTRAVENOUS | Status: AC
  Filled 2021-03-16: qty 5

## 2021-03-16 MED ORDER — SODIUM CHLORIDE 0.9 % IV SOLN
2.0000 g | INTRAVENOUS | Status: AC
  Administered 2021-03-16: 2 g via INTRAVENOUS
  Filled 2021-03-16: qty 2

## 2021-03-16 MED ORDER — ZOLPIDEM TARTRATE 5 MG PO TABS: 5.0000 mg | ORAL_TABLET | Freq: Every evening | ORAL | Status: DC | PRN

## 2021-03-16 MED ORDER — ALVIMOPAN 12 MG PO CAPS
12.0000 mg | ORAL_CAPSULE | ORAL | Status: AC
  Administered 2021-03-16: 12 mg via ORAL

## 2021-03-16 MED ORDER — METOPROLOL TARTRATE 5 MG/5ML IV SOLN
5.0000 mg | Freq: Four times a day (QID) | INTRAVENOUS | Status: DC | PRN
  Filled 2021-03-16: qty 5

## 2021-03-16 MED ORDER — KETOROLAC TROMETHAMINE 15 MG/ML IJ SOLN
INTRAMUSCULAR | Status: AC
  Filled 2021-03-16: qty 1

## 2021-03-16 MED ORDER — DEXAMETHASONE SODIUM PHOSPHATE 10 MG/ML IJ SOLN
INTRAMUSCULAR | Status: DC | PRN
  Administered 2021-03-16: 10 mg via INTRAVENOUS

## 2021-03-16 MED ORDER — ORAL CARE MOUTH RINSE: 15.0000 mL | Freq: Once | OROMUCOSAL | Status: DC

## 2021-03-16 MED ORDER — PROPOFOL 10 MG/ML IV BOLUS
INTRAVENOUS | Status: DC | PRN
  Administered 2021-03-16: 200 mg via INTRAVENOUS

## 2021-03-16 MED ORDER — DIPHENHYDRAMINE HCL 12.5 MG/5ML PO ELIX: 12.5000 mg | ORAL_SOLUTION | Freq: Four times a day (QID) | ORAL | Status: DC | PRN

## 2021-03-16 MED ORDER — SUCCINYLCHOLINE CHLORIDE 200 MG/10ML IV SOSY
PREFILLED_SYRINGE | INTRAVENOUS | Status: AC
  Filled 2021-03-16: qty 10

## 2021-03-16 MED ORDER — ONDANSETRON HCL 4 MG/2ML IJ SOLN
INTRAMUSCULAR | Status: DC | PRN
  Administered 2021-03-16: 4 mg via INTRAVENOUS

## 2021-03-16 MED ORDER — HYDROMORPHONE HCL 1 MG/ML IJ SOLN
INTRAMUSCULAR | Status: AC
  Filled 2021-03-16: qty 0.5

## 2021-03-16 MED ORDER — SODIUM CHLORIDE 0.9 % IV SOLN
2.0000 g | Freq: Two times a day (BID) | INTRAVENOUS | Status: DC
  Administered 2021-03-16 – 2021-03-18 (×5): 2 g via INTRAVENOUS
  Filled 2021-03-16 (×8): qty 2

## 2021-03-16 MED ORDER — LABETALOL HCL 5 MG/ML IV SOLN
INTRAVENOUS | Status: AC
  Filled 2021-03-16: qty 4

## 2021-03-16 MED ORDER — OXYCODONE HCL 5 MG PO TABS
5.0000 mg | ORAL_TABLET | ORAL | Status: DC | PRN
  Administered 2021-03-17: 10 mg via ORAL
  Administered 2021-03-17: 5 mg via ORAL
  Administered 2021-03-17 – 2021-03-18 (×3): 10 mg via ORAL
  Administered 2021-03-18: 5 mg via ORAL
  Administered 2021-03-18 – 2021-03-19 (×3): 10 mg via ORAL
  Filled 2021-03-16 (×2): qty 2
  Filled 2021-03-16 (×2): qty 1
  Filled 2021-03-16 (×5): qty 2

## 2021-03-16 MED ORDER — ROCURONIUM BROMIDE 10 MG/ML (PF) SYRINGE
PREFILLED_SYRINGE | INTRAVENOUS | Status: AC
  Filled 2021-03-16: qty 10

## 2021-03-16 MED ORDER — SUFENTANIL CITRATE 50 MCG/ML IV SOLN
INTRAVENOUS | Status: AC
  Filled 2021-03-16: qty 1

## 2021-03-16 MED ORDER — FENTANYL CITRATE PF 50 MCG/ML IJ SOSY
25.0000 ug | PREFILLED_SYRINGE | INTRAMUSCULAR | Status: DC | PRN
  Administered 2021-03-16 (×2): 50 ug via INTRAVENOUS
  Filled 2021-03-16 (×2): qty 1

## 2021-03-16 MED ORDER — SUFENTANIL CITRATE 50 MCG/ML IV SOLN
INTRAVENOUS | Status: DC | PRN
  Administered 2021-03-16 (×2): 10 ug via INTRAVENOUS
  Administered 2021-03-16 (×2): 5 ug via INTRAVENOUS
  Administered 2021-03-16 (×2): 10 ug via INTRAVENOUS

## 2021-03-16 MED ORDER — BUPIVACAINE LIPOSOME 1.3 % IJ SUSP
INTRAMUSCULAR | Status: AC
  Filled 2021-03-16: qty 20

## 2021-03-16 MED ORDER — ONDANSETRON HCL 4 MG/2ML IJ SOLN: 4.0000 mg | Freq: Four times a day (QID) | INTRAMUSCULAR | Status: DC | PRN

## 2021-03-16 SURGICAL SUPPLY — 58 items
BAG HAMPER (MISCELLANEOUS) ×3 IMPLANT
COVER LIGHT HANDLE STERIS (MISCELLANEOUS) ×6 IMPLANT
DRSG OPSITE POSTOP 4X8 (GAUZE/BANDAGES/DRESSINGS) ×3 IMPLANT
DRSG TEGADERM 2-3/8X2-3/4 SM (GAUZE/BANDAGES/DRESSINGS) ×6 IMPLANT
ELECT REM PT RETURN 9FT ADLT (ELECTROSURGICAL) ×3
ELECTRODE REM PT RTRN 9FT ADLT (ELECTROSURGICAL) ×2 IMPLANT
GAUZE 4X4 16PLY ~~LOC~~+RFID DBL (SPONGE) ×3 IMPLANT
GAUZE SPONGE 4X4 12PLY STRL LF (GAUZE/BANDAGES/DRESSINGS) ×3 IMPLANT
GLOVE SRG 8 PF TXTR STRL LF DI (GLOVE) ×4 IMPLANT
GLOVE SURG ENC MOIS LTX SZ6.5 (GLOVE) ×6 IMPLANT
GLOVE SURG LTX SZ6.5 (GLOVE) ×3 IMPLANT
GLOVE SURG LTX SZ8 (GLOVE) ×3 IMPLANT
GLOVE SURG NEOP MICRO LF SZ7.5 (GLOVE) ×6 IMPLANT
GLOVE SURG NEOPR MICRO LF SZ8 (GLOVE) ×6 IMPLANT
GLOVE SURG POLYISO LF SZ7.5 (GLOVE) ×6 IMPLANT
GLOVE SURG UNDER POLY LF SZ6.5 (GLOVE) ×6 IMPLANT
GLOVE SURG UNDER POLY LF SZ7 (GLOVE) ×18 IMPLANT
GLOVE SURG UNDER POLY LF SZ8 (GLOVE) ×6
GOWN STRL REUS W/TWL LRG LVL3 (GOWN DISPOSABLE) ×21 IMPLANT
INST SET LAPROSCOPIC AP (KITS) ×3 IMPLANT
INST SET MAJOR GENERAL (KITS) ×3 IMPLANT
KIT TURNOVER CYSTO (KITS) ×3 IMPLANT
LIGASURE LAP ATLAS 10MM 37CM (INSTRUMENTS) ×3 IMPLANT
MANIFOLD NEPTUNE II (INSTRUMENTS) ×3 IMPLANT
NEEDLE HYPO 18GX1.5 BLUNT FILL (NEEDLE) ×3 IMPLANT
NS IRRIG 1000ML POUR BTL (IV SOLUTION) ×12 IMPLANT
PACK COLON (CUSTOM PROCEDURE TRAY) ×3 IMPLANT
PAD ARMBOARD 7.5X6 YLW CONV (MISCELLANEOUS) ×3 IMPLANT
PENCIL SMOKE EVACUATOR COATED (MISCELLANEOUS) ×3 IMPLANT
RELOAD GRN CONTOUR (ENDOMECHANICALS) ×3 IMPLANT
RELOAD PROXIMATE 75MM BLUE (ENDOMECHANICALS) ×3 IMPLANT
RETRACTOR WND ALEXIS-O 25 LRG (MISCELLANEOUS) ×2 IMPLANT
RTRCTR WOUND ALEXIS O 25CM LRG (MISCELLANEOUS) ×3
SET TUBE SMOKE EVAC HIGH FLOW (TUBING) ×3 IMPLANT
SHEET LAVH (DRAPES) ×3 IMPLANT
SPONGE GAUZE 2X2 8PLY STRL LF (GAUZE/BANDAGES/DRESSINGS) ×6 IMPLANT
SPONGE T-LAP 18X18 ~~LOC~~+RFID (SPONGE) ×12 IMPLANT
STAPLER CIRC 28 3.5 SULU (STAPLE) ×3 IMPLANT
STAPLER CIRC CVD 29MM 37CM (STAPLE) ×3 IMPLANT
STAPLER CVD CUT GN 40 RELOAD (ENDOMECHANICALS) ×6 IMPLANT
STAPLER GUN LINEAR PROX 60 (STAPLE) ×3 IMPLANT
STAPLER PROXIMATE 75MM BLUE (STAPLE) ×3 IMPLANT
STAPLER SYS INTERNAL RELOAD SS (MISCELLANEOUS) ×6 IMPLANT
STAPLER VISISTAT (STAPLE) ×3 IMPLANT
SURGILUBE 2OZ TUBE FLIPTOP (MISCELLANEOUS) ×3 IMPLANT
SUT PDS AB CT VIOLET #0 27IN (SUTURE) ×6 IMPLANT
SUT PROLENE 2 0 SH 30 (SUTURE) ×3 IMPLANT
SUT SILK 3 0 SH CR/8 (SUTURE) ×3 IMPLANT
SUT VIC AB 0 CT1 27 (SUTURE) ×3
SUT VIC AB 0 CT1 27XBRD ANTBC (SUTURE) ×2 IMPLANT
SYR 20ML LL LF (SYRINGE) ×3 IMPLANT
SYR BULB IRRIG 60ML STRL (SYRINGE) ×3 IMPLANT
TRAY FOLEY MTR SLVR 16FR STAT (SET/KITS/TRAYS/PACK) ×3 IMPLANT
TROCAR ENDO BLADELESS 11MM (ENDOMECHANICALS) ×3 IMPLANT
TROCAR XCEL NON-BLD 5MMX100MML (ENDOMECHANICALS) ×3 IMPLANT
TROCAR XCEL UNIV SLVE 11M 100M (ENDOMECHANICALS) ×3 IMPLANT
WARMER LAPAROSCOPE (MISCELLANEOUS) ×3 IMPLANT
YANKAUER SUCT BULB TIP 10FT TU (MISCELLANEOUS) ×3 IMPLANT

## 2021-03-16 NOTE — Anesthesia Procedure Notes (Signed)
Procedure Name: Intubation Date/Time: 03/16/2021 7:43 AM Performed by: Vista Deck, CRNA Pre-anesthesia Checklist: Patient identified, Patient being monitored, Timeout performed, Emergency Drugs available and Suction available Patient Re-evaluated:Patient Re-evaluated prior to induction Oxygen Delivery Method: Circle System Utilized Preoxygenation: Pre-oxygenation with 100% oxygen Induction Type: IV induction, Rapid sequence and Cricoid Pressure applied Laryngoscope Size: Mac and 3 Grade View: Grade I Tube type: Oral Tube size: 7.5 mm Number of attempts: 1 Airway Equipment and Method: stylet and Oral airway Placement Confirmation: ETT inserted through vocal cords under direct vision, positive ETCO2 and breath sounds checked- equal and bilateral Secured at: 22 cm Tube secured with: Tape Dental Injury: Teeth and Oropharynx as per pre-operative assessment
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
## 2021-03-16 NOTE — Anesthesia Postprocedure Evaluation (Signed)
Anesthesia Post Note  Patient: [name]  Procedure(s) Performed: LAPAROSCOPIC CONVERTED TO OPEN LEFT HEMICOLECTOMY (Left: Abdomen) CYSTOSTOMY SUPRAPUBIC REPAIR WITH LEFT URETERAL LYSIS (Left)  Patient location during evaluation: Phase II Anesthesia Type: General Level of consciousness: awake Pain management: pain level controlled Vital Signs Assessment: post-procedure vital signs reviewed and stable Respiratory status: spontaneous breathing and respiratory function stable Cardiovascular status: blood pressure returned to baseline and stable Postop Assessment: no headache and no apparent nausea or vomiting Anesthetic complications: no Comments: Late entry   No notable events documented.   Last Vitals:  Vitals:   03/16/21 1330 03/16/21 1345  BP: 140/86 140/86  Pulse: 68 68  Resp: 16 16  Temp: 36.8 C 36.8 C  SpO2: 97%     Last Pain:  Vitals:   03/16/21 1407  TempSrc:   PainSc: Haivana Nakya

## 2021-03-16 NOTE — Progress Notes (Signed)
Patient walked with NT to the nurses' station and back to his room around 2000. Patient said he felt dizzy and would try again later.

## 2021-03-16 NOTE — Anesthesia Preprocedure Evaluation (Signed)
Anesthesia Evaluation  Patient identified by MRN, date of birth, ID band Patient awake    Reviewed: Allergy & Precautions, H&P , NPO status , Patient's Chart, lab work & pertinent test results, reviewed documented beta blocker date and time   Airway Mallampati: II  TM Distance: >3 FB Neck ROM: full    Dental no notable dental hx.    Pulmonary neg pulmonary ROS, Current Smoker and Patient abstained from smoking.,    Pulmonary exam normal breath sounds clear to auscultation       Cardiovascular Exercise Tolerance: Good hypertension, + CAD, + Past MI, + Cardiac Stents and + Peripheral Vascular Disease   Rhythm:regular Rate:Normal     Neuro/Psych PSYCHIATRIC DISORDERS Anxiety Depression Bipolar Disorder  Neuromuscular disease    GI/Hepatic Neg liver ROS, GERD  Medicated,  Endo/Other  negative endocrine ROS  Renal/GU negative Renal ROS  negative genitourinary   Musculoskeletal   Abdominal   Peds  Hematology  (+) Blood dyscrasia, anemia ,   Anesthesia Other Findings   Reproductive/Obstetrics negative OB ROS                             Anesthesia Physical Anesthesia Plan  ASA: 3  Anesthesia Plan: General and General ETT   Post-op Pain Management:    Induction:   PONV Risk Score and Plan: Ondansetron  Airway Management Planned:   Additional Equipment:   Intra-op Plan:   Post-operative Plan:   Informed Consent: I have reviewed the patients History and Physical, chart, labs and discussed the procedure including the risks, benefits and alternatives for the proposed anesthesia with the patient or authorized representative who has indicated his/her understanding and acceptance.     Dental Advisory Given  Plan Discussed with: CRNA  Anesthesia Plan Comments:         Anesthesia Quick Evaluation

## 2021-03-16 NOTE — Op Note (Signed)
[facility] Operative Note  03/16/21  Preoperative Diagnosis: Sigmoid stricture    Postoperative Diagnosis: Sigmoid stricture, colovesical fistula    Procedure(s) Performed: Laparoscopic converted to open left hemicolectomy (end to end anastomosis); Splenic flexure take down, (Separate procedure note Dr. Alyson Ingles bladder repair)    Surgeon: Lanell Matar. Constance Haw, MD   Assistants: Aviva Signs, MD     Anesthesia: General endotracheal   Anesthesiologist: Louann Sjogren, MD    Specimens: Left colon suture proximal, sigmoid colon (colovesical fistula defect distal), failed anastomosis proximal donut staple line disrupted    Estimated Blood Loss: Minimal   Blood Replacement: None    Complications: None   Wound Class: Contaminated    Operative Indications: Mr. Hamacher is a very sweet 60 yo with sigmoid diverticulitis prior concerns for colovesical fistula s/p cystoscopy that was negative and colonoscopy with a sigmoid stricture. He has been dealing with pain and constipation, and we discussed partial colectomy and risk of bleeding, infection, anastomotic leak, colostomy, bladder involvement, ureter injury. He opted to proceed. I spoke with Dr. Alyson Ingles prior and he did not feel like he needed any stents but he would be available today pending any issues.   Findings: Colovesical fistula to sigmoid colon, bladder muscle exposed    Procedure: The patient was taken to the operating room and placed supine. General endotracheal anesthesia was induced. Intravenous antibiotics were administered per protocol.  A foley catheter was placed. He was then put in lithotomy with all pressure points padded and the right arm tucked. An orogastric tube positioned to decompress the stomach. The abdomen and perineum were prepared and draped in the usual sterile fashion.   A supraumbilical incision was made and a Veress needle was inserted and the saline drop test and low insufflation  pressures confirmed intraperitoneal placement. A 11 mm trocar was placed under direct visualization.  His sigmoid colon had adhesions to the left pelvic sidewall and bladder. Additional 5 mm trocars and a 11 mm trocar were placed in the right mid clavicular line mid and lower abdomen under direct visualization. He was placed in Trendelenburg with the left side up.  The sigmoid colon and bladder were very adherent with hard inflammatory rind. I was able to take down the white line of Toldt on the left colon and takedown the splenic flexure with the ligaure. I attempted to dissect the bladder from the sigmoid colon but given the extent of inflammation I opted to convert to a open procedure as I was afraid of injury to the bladder given the prior concern for a colovesical fistula.   The midline was opened from the supraumbilical port inferiorly. A wound protector was placed. The remaining trocars were removed. The sigmoid colon was noted and with blunt dissection the bladder was freed from the hardened sigmoid colon. An exposed pucker of mucosa was noted consistent with a colovesical fistula and bladder muscle was noted.  We notified Dr. Alyson Ingles.   The left ureter was identified and protected, and the proximal point of transection was taken with a linear 75 mm stapler and the distal point of transection past the hardened sigmoid colon was taken with Contour TA stapler.  The specimen was passed off with the fistula being distal on the colon. Dr. Alyson Ingles came in and identified the left ureter and followed it down to the bladder insertion site and repaired the bladder wall (See his dictation).  There was no obvious infection or purulence noted.   The proximal colon was held  with Allis clamps and a purse-stringer was used. The colon was opened and the 28 EEA (Coviden) anvil was placed and secured.  My partner went below and easily inserted the 28 EEA into the anus and deployed the spike just below the staple line.  The EEA stapler and anvil were mated and tightened down and fired.  The stapler was and anvil were removed. The proximal donut did not appear intact and a leak test was done confirming a leak. On the anterior edge of the proximal colon the mucosa was exposed leading [place] to believe the purse-string suture tore or pulled through at some point.  We had sufficient length of stump and colon to perform a second anastomosis.  Two Contour TA staplers were used to come across proximal and distal to the failed anastomosis.  Good health ends of colon and stump were noted.  This was passed off as a specimen.   The mesentery of the left colon prevented the colon from draping down into the pelvis without tension, so the remaining left colon was removed with a linear 75 mm stapler.  The splenic flexure as completely taken down and the transverse colon draped easily into the pelvis without tension.    The transverse colon was held with Allis clamps at the staple line and a new purse-stringer was used. The colon was opened and a 29 EEA  (Ethicon) anvil was inserted and secured. A second row of purse-string was placed with 2-0 Prolene suture. My partner then inserted the 88 EEA into the anus and deployed the spike at the staple line. The EEA stapler and anvil were joined and tightened down. The stapler was fired and removed. Two intact donuts were noted. A leak test was performed and was negative. I oversewed the staple line with 3-0 Silk Lembert sutures. Irrigation as performed.  Omentum was placed between the anastomosis and the bladder repair.   Dr. Arnoldo Morale was assisting throughout the procedure and was present for the critical portions of the case.   The entire team changed gown and gloves and new equipment was used and drapes for closing.  The abdomen was closed in the standard fashion with a running 0 PDS suture. The skin was closed with staples. The two port sites were closed with staples. Sterile dressings were placed.    Final inspection revealed acceptable hemostasis. All counts were correct at the end of the case. The patient was awakened from anesthesia and extubated without complication.  The patient went to the PACU in stable condition.   Curlene Labrum, MD [facility] [address] [place], [place] [address] [phone] (office)

## 2021-03-16 NOTE — Transfer of Care (Signed)
Immediate Anesthesia Transfer of Care Note  Patient: [name]  Procedure(s) Performed: LAPAROSCOPIC CONVERTED TO OPEN LEFT HEMICOLECTOMY (Left: Abdomen) CYSTOSTOMY SUPRAPUBIC REPAIR WITH LEFT URETERAL LYSIS (Left)  Patient Location: PACU  Anesthesia Type:General  Level of Consciousness: awake, alert  and patient cooperative  Airway & Oxygen Therapy: Patient Spontanous Breathing and Patient connected to nasal cannula oxygen  Post-op Assessment: Report given to RN and Post -op Vital signs reviewed and stable  Post vital signs: Reviewed and stable  Last Vitals:  Vitals Value Taken Time  BP 148/100 03/16/21 1104  Temp 98.2   Pulse 88 03/16/21 1106  Resp 14 03/16/21 1106  SpO2 94 % 03/16/21 1106  Vitals shown include unvalidated device data.  Last Pain:  Vitals:   03/16/21 0657  TempSrc: Oral  PainSc: 8       Patients Stated Pain Goal: 8 ([id number])  Complications: No notable events documented.

## 2021-03-16 NOTE — Progress Notes (Addendum)
[facility]  Updated sister that surgery completed. Catheter in place for 7 days due to colovesical fistula and bladder repair. Purple area on right posterior neck RN found out that this is his birthmark. We have confirmed with the patient.   Curlene Labrum, MD [facility] [address] [place], [place] [address] [phone] (office)

## 2021-03-16 NOTE — Progress Notes (Signed)
[facility]  Doing well post op. No major complaints.  Has some liquids.  Curlene Labrum, MD [facility] [address] [place], [place] [address] [phone] (office)

## 2021-03-17 ENCOUNTER — Encounter (HOSPITAL_COMMUNITY): Payer: Self-pay | Admitting: General Surgery

## 2021-03-17 LAB — MAGNESIUM: Magnesium: 1.4 mg/dL — ABNORMAL LOW (ref 1.7–2.4)

## 2021-03-17 LAB — CBC WITH DIFFERENTIAL/PLATELET
Abs Immature Granulocytes: 0.05 10*3/uL (ref 0.00–0.07)
Basophils Absolute: 0 10*3/uL (ref 0.0–0.1)
Basophils Relative: 0 %
Eosinophils Absolute: 0 10*3/uL (ref 0.0–0.5)
Eosinophils Relative: 0 %
HCT: 34.9 % — ABNORMAL LOW (ref 39.0–52.0)
Hemoglobin: 12.3 g/dL — ABNORMAL LOW (ref 13.0–17.0)
Immature Granulocytes: 1 %
Lymphocytes Relative: 10 %
Lymphs Abs: 0.9 10*3/uL (ref 0.7–4.0)
MCH: 36 pg — ABNORMAL HIGH (ref 26.0–34.0)
MCHC: 35.2 g/dL (ref 30.0–36.0)
MCV: 102 fL — ABNORMAL HIGH (ref 80.0–100.0)
Monocytes Absolute: 0.6 10*3/uL (ref 0.1–1.0)
Monocytes Relative: 6 %
Neutro Abs: 7.5 10*3/uL (ref 1.7–7.7)
Neutrophils Relative %: 83 %
Platelets: 159 10*3/uL (ref 150–400)
RBC: 3.42 MIL/uL — ABNORMAL LOW (ref 4.22–5.81)
RDW: 12.1 % (ref 11.5–15.5)
WBC: 9 10*3/uL (ref 4.0–10.5)
nRBC: 0 % (ref 0.0–0.2)

## 2021-03-17 LAB — BASIC METABOLIC PANEL
Anion gap: 11 (ref 5–15)
BUN: 6 mg/dL — ABNORMAL LOW (ref 8–23)
CO2: 23 mmol/L (ref 22–32)
Calcium: 8.6 mg/dL — ABNORMAL LOW (ref 8.9–10.3)
Chloride: 103 mmol/L (ref 98–111)
Creatinine, Ser: 0.82 mg/dL (ref 0.61–1.24)
GFR, Estimated: >60 mL/min (ref >60)
Glucose, Bld: 103 mg/dL — ABNORMAL HIGH (ref 70–99)
Potassium: 3.8 mmol/L (ref 3.5–5.1)
Sodium: 137 mmol/L (ref 135–145)

## 2021-03-17 LAB — PHOSPHORUS: Phosphorus: 3.6 mg/dL (ref 2.5–4.6)

## 2021-03-17 MED ORDER — MAGNESIUM SULFATE 2 GM/50ML IV SOLN
2.0000 g | Freq: Once | INTRAVENOUS | Status: AC
  Administered 2021-03-17: 2 g via INTRAVENOUS
  Filled 2021-03-17: qty 50

## 2021-03-17 MED ORDER — POTASSIUM CHLORIDE 20 MEQ PO PACK
40.0000 meq | PACK | Freq: Once | ORAL | Status: AC
  Administered 2021-03-17: 40 meq via ORAL
  Filled 2021-03-17: qty 2

## 2021-03-17 MED ORDER — CHLORHEXIDINE GLUCONATE CLOTH 2 % EX PADS
6.0000 | MEDICATED_PAD | Freq: Every day | CUTANEOUS | Status: DC
  Administered 2021-03-17 – 2021-03-19 (×3): 6 via TOPICAL

## 2021-03-17 MED ORDER — PROSOURCE PLUS PO LIQD
30.0000 mL | Freq: Three times a day (TID) | ORAL | Status: DC
  Administered 2021-03-17 – 2021-03-19 (×6): 30 mL via ORAL
  Filled 2021-03-17 (×6): qty 30

## 2021-03-17 NOTE — Progress Notes (Signed)
[facility] Progress Note  1 Day Post-Op  Subjective: Doing fair. Pain controlled. No nausea.   Objective: Vital signs in last 24 hours: Temp:  [98.1 F (36.7 C)-98.4 F (36.9 C)] 98.1 F (36.7 C) (09/20 1000) Pulse Rate:  [48-68] 64 (09/20 1000) Resp:  [16-19] 18 (09/20 1000) BP: (138-158)/(77-87) 155/77 (09/20 1000) SpO2:  [96 %-99 %] 98 % (09/20 1000) Weight:  [70.2 kg-72.4 kg] 70.2 kg (09/20 0500) Last BM Date: 03/16/21  Intake/Output from previous day: 09/19 0701 - 09/20 0700 In: 4188.8 [P.O.:720; I.V.:2957.7; IV Piggyback:511] Out: 2005 [Urine:1755; Blood:250] Intake/Output this shift: Total I/O In: 240 [P.O.:240] Out: 625 [Urine:625]  General appearance: alert, cooperative, and no distress Resp: normal work of breathing GI: soft, nondistended, appropriately tender, staples covered with honeycomb, dry blood   Lab Results:  Recent Labs    03/17/21 0518  WBC 9.0  HGB 12.3*  HCT 34.9*  PLT 159   BMET Recent Labs    03/17/21 0518  NA 137  K 3.8  CL 103  CO2 23  GLUCOSE 103*  BUN 6*  CREATININE 0.82  CALCIUM 8.6*   PT/INR No results for input(s): LABPROT, INR in the last 72 hours.  Studies/Results: No results found.  Anti-infectives: Anti-infectives (From admission, onward)    Start     Dose/Rate Route Frequency Ordered Stop   03/16/21 2200  cefoTEtan (CEFOTAN) 2 g in sodium chloride 0.9 % 100 mL IVPB        2 g 200 mL/hr over 30 Minutes Intravenous Every 12 hours 03/16/21 1329 03/20/21 2159   03/16/21 0645  cefoTEtan (CEFOTAN) 2 g in sodium chloride 0.9 % 100 mL IVPB        2 g 200 mL/hr over 30 Minutes Intravenous On call to O.R. 03/16/21 0643 03/16/21 0800       Assessment/Plan: Mr. Dimmick is a 60 yo s/p open left hemicolectomy and bladder repair for sigmoid stricture and colovesical fistula. Doing well.  ERAS pain IS, OOB HD ok Clear diet Entereg, await bowel function Cefotetan for contamination  H&H drifting,  no signs of bleeding Lytes replaced Foley in place, do not remove Scds, heparin sq    LOS: 1 day    Virl Cagey 03/17/2021

## 2021-03-17 NOTE — Progress Notes (Signed)
Initial Nutrition Assessment  DOCUMENTATION CODES:   Not applicable  INTERVENTION:  Recommend discontinue Ensure Surgery -BID between meals. Poor tolerance per patient.   ProSource 30 ml TID (each 30 ml provides 100 kcal, 15 gr protein)   Advance diet as tolerated per MD  NUTRITION DIAGNOSIS:   Increased nutrient needs related to post-op healing (s/p open left hemicolectomy cystostomy suprapubic repair with left ureteral lysis on 9/19.) as evidenced by estimated needs.   GOAL:  Patient will meet greater than or equal to 90% of their needs  MONITOR:  Diet advancement, PO intake, Weight trends, Skin, Labs  REASON FOR ASSESSMENT:   Malnutrition Screening Tool    ASSESSMENT: Patient is a 60 yo male with hx of constipation, diverticulitis multiple episodes and sigmoid stricture per MD. He is status post colovesical fistula and bladder repair 9/19. (Open left hemicolectomy cystostomy suprapubic repair with left ureteral lysis).  Patient on clear liquid diet at breakfast. No family bedside. Patient says his bowels moved this afternoon and would like some "regular food". Diet advanced to full liquids per MD approval. He has an oral nutrition supplement but has not been able to drink it- says it makes him feel sick. Encouraged patient to try protein modular to support healing following surgery and preservation of lean body mass  Patient has not been eating well at home. Lighter food choices: mainly cottage cheese and fruit, fish and chicken. Limiting red meats because "they hurt him".   His weight has been ranging 70-73 kg since late May.  Currently 70.2 kg. Patient reports usual weight of 160 lb.   Medications reviewed and include: Toporol, neurontin, Tylenol  BMP Latest Ref Rng & Units 03/17/2021 03/13/2021 11/17/2020  Glucose 70 - 99 mg/dL 103(H) 100(H) 92  BUN 8 - 23 mg/dL 6(L) 7(L) 8  Creatinine 0.61 - 1.24 mg/dL 0.82 0.79 0.82  BUN/Creat Ratio 9 - 20 - - -  Sodium 135 - 145  mmol/L 137 136 135  Potassium 3.5 - 5.1 mmol/L 3.8 3.8 3.4(L)  Chloride 98 - 111 mmol/L 103 103 98  CO2 22 - 32 mmol/L 23 25 25   Calcium 8.9 - 10.3 mg/dL 8.6(L) 9.2 9.8      NUTRITION - FOCUSED PHYSICAL EXAM:  Flowsheet Row Most Recent Value  Orbital Region No depletion  Upper Arm Region Mild depletion  Thoracic and Lumbar Region No depletion  Buccal Region Mild depletion  Temple Region No depletion  Clavicle Bone Region Mild depletion  Scapular Bone Region Unable to assess  Dorsal Hand No depletion  Patellar Region No depletion  Anterior Thigh Region No depletion  Posterior Calf Region No depletion  Edema (RD Assessment) None  Hair Reviewed  Eyes Reviewed  Mouth Reviewed  Skin Reviewed  Nails Reviewed      Diet Order:   Diet Order             Diet full liquid Room service appropriate? Yes; Fluid consistency: Thin  Diet effective now                   EDUCATION NEEDS:     Skin:  Skin Assessment: Skin Integrity Issues: Skin Integrity Issues:: Incisions Incisions: closed- abdomen  Last BM:  9/19  Height:   Ht Readings from Last 1 Encounters:  03/16/21 5\' 6"  (1.676 m)    Weight:   Wt Readings from Last 1 Encounters:  03/17/21 70.2 kg    Ideal Body Weight:   65 kg  BMI:  Body  mass index is 24.98 kg/m.  Estimated Nutritional Needs:   Kcal:  2100-2310  Protein:  98-105 gr  Fluid:  > 2 liters daily  Colman Cater MS,RD,CSG,LDN Contact: Shea Evans

## 2021-03-17 NOTE — Plan of Care (Signed)
  Problem: Education: Goal: Knowledge of General Education information will improve Description Including pain rating scale, medication(s)/side effects and non-pharmacologic comfort measures Outcome: Progressing   Problem: Health Behavior/Discharge Planning: Goal: Ability to manage health-related needs will improve Outcome: Progressing   

## 2021-03-18 LAB — CBC WITH DIFFERENTIAL/PLATELET
Abs Immature Granulocytes: 0.02 10*3/uL (ref 0.00–0.07)
Basophils Absolute: 0 10*3/uL (ref 0.0–0.1)
Basophils Relative: 0 %
Eosinophils Absolute: 0 10*3/uL (ref 0.0–0.5)
Eosinophils Relative: 0 %
HCT: 38.4 % — ABNORMAL LOW (ref 39.0–52.0)
Hemoglobin: 13.3 g/dL (ref 13.0–17.0)
Immature Granulocytes: 0 %
Lymphocytes Relative: 26 %
Lymphs Abs: 2 10*3/uL (ref 0.7–4.0)
MCH: 35.4 pg — ABNORMAL HIGH (ref 26.0–34.0)
MCHC: 34.6 g/dL (ref 30.0–36.0)
MCV: 102.1 fL — ABNORMAL HIGH (ref 80.0–100.0)
Monocytes Absolute: 0.4 10*3/uL (ref 0.1–1.0)
Monocytes Relative: 5 %
Neutro Abs: 5.2 10*3/uL (ref 1.7–7.7)
Neutrophils Relative %: 69 %
Platelets: 167 10*3/uL (ref 150–400)
RBC: 3.76 MIL/uL — ABNORMAL LOW (ref 4.22–5.81)
RDW: 12.2 % (ref 11.5–15.5)
WBC: 7.6 10*3/uL (ref 4.0–10.5)
nRBC: 0 % (ref 0.0–0.2)

## 2021-03-18 LAB — BASIC METABOLIC PANEL
Anion gap: 9 (ref 5–15)
BUN: 8 mg/dL (ref 8–23)
CO2: 29 mmol/L (ref 22–32)
Calcium: 8.8 mg/dL — ABNORMAL LOW (ref 8.9–10.3)
Chloride: 99 mmol/L (ref 98–111)
Creatinine, Ser: 0.82 mg/dL (ref 0.61–1.24)
GFR, Estimated: >60 mL/min (ref >60)
Glucose, Bld: 91 mg/dL (ref 70–99)
Potassium: 3 mmol/L — ABNORMAL LOW (ref 3.5–5.1)
Sodium: 137 mmol/L (ref 135–145)

## 2021-03-18 LAB — MAGNESIUM: Magnesium: 1.9 mg/dL (ref 1.7–2.4)

## 2021-03-18 LAB — PHOSPHORUS: Phosphorus: 2.2 mg/dL — ABNORMAL LOW (ref 2.5–4.6)

## 2021-03-18 LAB — SURGICAL PATHOLOGY

## 2021-03-18 MED ORDER — POTASSIUM CHLORIDE 20 MEQ PO PACK
40.0000 meq | PACK | Freq: Three times a day (TID) | ORAL | Status: AC
  Administered 2021-03-18 (×3): 40 meq via ORAL
  Filled 2021-03-18 (×3): qty 2

## 2021-03-18 MED ORDER — ENOXAPARIN SODIUM 40 MG/0.4ML IJ SOSY
40.0000 mg | PREFILLED_SYRINGE | INTRAMUSCULAR | Status: DC
  Administered 2021-03-18: 40 mg via SUBCUTANEOUS
  Filled 2021-03-18: qty 0.4

## 2021-03-18 NOTE — Progress Notes (Signed)
[facility] Progress Note  2 Days Post-Op  Subjective: Having Bms minor bloody yesterday but not today. No complaints. Ambulating.   Objective: Vital signs in last 24 hours: Temp:  [98.1 F (36.7 C)-98.5 F (36.9 C)] 98.1 F (36.7 C) (09/21 0539) Pulse Rate:  [61-77] 77 (09/21 1024) Resp:  [17-20] 17 (09/21 0539) BP: (147-173)/(74-90) 173/90 (09/21 1024) SpO2:  [96 %-99 %] 99 % (09/21 0539) Weight:  [66.7 kg] 66.7 kg (09/21 0533) Last BM Date: 03/17/21  Intake/Output from previous day: 09/20 0701 - 09/21 0700 In: 820 [P.O.:720; IV Piggyback:100] Out: 4975 [Urine:4975] Intake/Output this shift: No intake/output data recorded.  General appearance: alert, cooperative, and no distress Resp: normal work of breathing GI: soft, mildly distended, appropriately tender, dressing removed and staples c/d/I   Lab Results:  Recent Labs    03/17/21 0518 03/18/21 0515  WBC 9.0 7.6  HGB 12.3* 13.3  HCT 34.9* 38.4*  PLT 159 167   BMET Recent Labs    03/17/21 0518 03/18/21 0515  NA 137 137  K 3.8 3.0*  CL 103 99  CO2 23 29  GLUCOSE 103* 91  BUN 6* 8  CREATININE 0.82 0.82  CALCIUM 8.6* 8.8*    Anti-infectives: Anti-infectives (From admission, onward)    Start     Dose/Rate Route Frequency Ordered Stop   03/16/21 2200  cefoTEtan (CEFOTAN) 2 g in sodium chloride 0.9 % 100 mL IVPB        2 g 200 mL/hr over 30 Minutes Intravenous Every 12 hours 03/16/21 1329 03/20/21 2159   03/16/21 0645  cefoTEtan (CEFOTAN) 2 g in sodium chloride 0.9 % 100 mL IVPB        2 g 200 mL/hr over 30 Minutes Intravenous On call to O.R. 03/16/21 0643 03/16/21 0800       Assessment/Plan: [name] is a 60 yo s/p open left hemicolectomy and bladder repair for sigmoid stricture and colovesical fistula. Doing well.  ERAS pain IS, OOB Off Tele Soft diet  Entereg dc  Cefotetan for contamination  H&H stable, no signs of bleeding  Lytes replaced Foley in place, do not  remove Scds, heparin sq to lovenox    LOS: 2 days    Virl Cagey 03/18/2021

## 2021-03-18 NOTE — Op Note (Signed)
Preoperative diagnosis: colovesical fistula   Postop diagnosis: Same   Procedure: 1. repair of cystotomy 2. Left ureterolysis   Attending: Nicolette Bang, MD   Assistant: Curlene Labrum, MD, Aviva Signs, MD   Anesthesia: General   Estimated blood loss: 100 cc   Drains: 18 French Foley catheter   Specimens: none   Findings: Left posterior to dome cystotomy from colovesical fistula. Adhesions of the left distal ureter to pelvic sidewall.    Indications: Patient is a 60 year old with a concern for colovesical fistula who was found to have a fistula at the time of colectomy. Urology was called to close bladder after colon was resected.    Procedure in detail: his genitalia and abdomen was then prepped and draped in the usual sterile fashion. The patient had a laparotomy incision and the cystotomy was identified at the left posterior dome. The left ureter was clearly identified below the iliac vessels and was noted to have dense adhesions which tethered it to the pelvic sidewall. We then proceeded to close the bladder defect with a running 0 vicryl in 2 layers. We then proceeded to free the distal left ureter from the pelvic sidewall using both electrocautery and sharp dissection. Once the ureter was freed this concluded the urology portion of the procedure. Dr Constance Haw proceeded to close the incision and this then concluded the procedure which was well tolerated by the patient.   Complications: None   Condition: Stable, transferred to PACU.   Plan: Patient is to be admitted for inpatient stay. The foley catheter will be removed in 7 days.

## 2021-03-19 DIAGNOSIS — N321 Vesicointestinal fistula: Secondary | ICD-10-CM

## 2021-03-19 MED ORDER — ONDANSETRON HCL 4 MG PO TABS: 4.0000 mg | ORAL_TABLET | Freq: Four times a day (QID) | ORAL | 0 refills | Status: DC | PRN

## 2021-03-19 MED ORDER — OXYCODONE HCL 5 MG PO TABS: 5.0000 mg | ORAL_TABLET | ORAL | 0 refills | Status: DC | PRN

## 2021-03-19 MED ORDER — AMOXICILLIN-POT CLAVULANATE 875-125 MG PO TABS: 1.0000 | ORAL_TABLET | Freq: Two times a day (BID) | ORAL | 0 refills | Status: AC

## 2021-03-19 NOTE — Progress Notes (Signed)
Patient discharged home with instructions given on foley catheter care,and maintenance, verbalized understanding with returned demonstration of foley care. Standard foley bag changed to leg bag per MD order's, and patient was sent home with foley drainage bag for night time use. Instructions were given on discharge medications and follow up visits,patient verbalized understanding. Accompanied by staff to an awaiting vehicle.

## 2021-03-19 NOTE — Discharge Summary (Addendum)
Physician Discharge Summary  Patient ID: [name] MRN: [id number] DOB/AGE: 62-Dec-1962 62 y.o.  Admit date: 03/16/2021 Discharge date: 03/19/2021  Admission Diagnoses:  Sigmoid stricture   Discharge Diagnoses:  Principal Problem:   Sigmoid stricture [facility]) Active Problems:   Colovesical fistula   Discharged Condition: good  Hospital Course: [name] is a very sweet 60 yo who underwent a sigmoid colectomy and bladder repair with diverticulitis with stricture and colovesical fistula. He has done well post operativley. He is tolerating his diet, having pain control with oral meds and is having Bms. He wants to go home. He has a foley in place from the bladder repair. He has been instructed on care for the foley. He will go home on 2 more days of augmentin given the inflammation and colovesical fistula intraoperatively.   Consults:  Urology- intraoperative bladder repair   Significant Diagnostic Studies:  Results for [name] (MRN [id number]) as of 03/19/2021 09:54  Ref. Range 03/18/2021 05:15  Sodium Latest Ref Range: 135 - 145 mmol/L 137  Potassium Latest Ref Range: 3.5 - 5.1 mmol/L 3.0 (L)  Chloride Latest Ref Range: 98 - 111 mmol/L 99  CO2 Latest Ref Range: 22 - 32 mmol/L 29  Glucose Latest Ref Range: 70 - 99 mg/dL 91  BUN Latest Ref Range: 8 - 23 mg/dL 8  Creatinine Latest Ref Range: 0.61 - 1.24 mg/dL 0.82  Calcium Latest Ref Range: 8.9 - 10.3 mg/dL 8.8 (L)  Anion gap Latest Ref Range: 5 - 15  9  Phosphorus Latest Ref Range: 2.5 - 4.6 mg/dL 2.2 (L)  Magnesium Latest Ref Range: 1.7 - 2.4 mg/dL 1.9  GFR, Estimated Latest Ref Range: >60 mL/min >60  WBC Latest Ref Range: 4.0 - 10.5 K/uL 7.6  RBC Latest Ref Range: 4.22 - 5.81 MIL/uL 3.76 (L)  Hemoglobin Latest Ref Range: 13.0 - 17.0 g/dL 13.3  HCT Latest Ref Range: 39.0 - 52.0 % 38.4 (L)  MCV Latest Ref Range: 80.0 - 100.0 fL 102.1 (H)  MCH Latest Ref Range: 26.0 - 34.0 pg 35.4 (H)  MCHC Latest Ref Range: 30.0 -  36.0 g/dL 34.6  RDW Latest Ref Range: 11.5 - 15.5 % 12.2  Platelets Latest Ref Range: 150 - 400 K/uL 167  nRBC Latest Ref Range: 0.0 - 0.2 % 0.0  Neutrophils Latest Units: % 69  Lymphocytes Latest Units: % 26  Monocytes Relative Latest Units: % 5  Eosinophil Latest Units: % 0  Basophil Latest Units: % 0  Immature Granulocytes Latest Units: % 0  NEUT# Latest Ref Range: 1.7 - 7.7 K/uL 5.2  Lymphocyte # Latest Ref Range: 0.7 - 4.0 K/uL 2.0  Monocyte # Latest Ref Range: 0.1 - 1.0 K/uL 0.4  Eosinophils Absolute Latest Ref Range: 0.0 - 0.5 K/uL 0.0  Basophils Absolute Latest Ref Range: 0.0 - 0.1 K/uL 0.0  Abs Immature Granulocytes Latest Ref Range: 0.00 - 0.07 K/uL 0.02   Treatments: IV hydration and Laparoscopic to open left hemicolectomy, bladder repair   Discharge Exam: Blood pressure (!) 152/88, pulse 65, temperature 98.3 F (36.8 C), resp. rate 20, height 5\' 6"  (1.676 m), weight 69.2 kg, SpO2 97 %. General appearance: alert, cooperative, and no distress Resp: normal work of breathing GI: soft, nondistended, appropriately tender, staples c/d/I without erythema or drainage, minor swelling at midline   Disposition: Discharge disposition: 01-Home or Self Care      Discharge Instructions     Call MD for:  difficulty breathing, headache or visual disturbances  Complete by: As directed    Call MD for:  extreme fatigue   Complete by: As directed    Call MD for:  persistant dizziness or light-headedness   Complete by: As directed    Call MD for:  persistant nausea and vomiting   Complete by: As directed    Call MD for:  redness, tenderness, or signs of infection (pain, swelling, redness, odor or green/yellow discharge around incision site)   Complete by: As directed    Call MD for:  severe uncontrolled pain   Complete by: As directed    Call MD for:  temperature >100.4   Complete by: As directed    Increase activity slowly   Complete by: As directed       Allergies as of  03/19/2021       Reactions   Topamax    Loss of control of his muscles   Topiramate    Loss of control of his muscles   Celecoxib    REACTION: heartburn   Codeine Nausea Only   Crestor [rosuvastatin] Hives   Azithromycin Diarrhea        Medication List     TAKE these medications    acetaminophen 500 MG tablet Commonly known as: TYLENOL Take 1,000 mg by mouth every 6 (six) hours as needed for moderate pain.   amLODipine 10 MG tablet Commonly known as: NORVASC Take 1 tablet (10 mg total) by mouth daily.   amoxicillin-clavulanate 875-125 MG tablet Commonly known as: Augmentin Take 1 tablet by mouth every 12 (twelve) hours for 2 days.   aspirin EC 81 MG tablet Take 1 tablet (81 mg total) by mouth daily.   atorvastatin 40 MG tablet Commonly known as: LIPITOR Take 1 tablet (40 mg total) by mouth daily.   ibuprofen 200 MG tablet Commonly known as: ADVIL Take 400 mg by mouth every 6 (six) hours as needed for moderate pain.   metoprolol succinate 50 MG 24 hr tablet Commonly known as: TOPROL-XL Take 1 tablet (50 mg total) by mouth daily. Take with or immediately following a meal.   nitroGLYCERIN 0.4 MG SL tablet Commonly known as: NITROSTAT Place 1 tablet (0.4 mg total) under the tongue every 5 (five) minutes x 3 doses as needed for chest pain (if no relief after 2nd dose, proceed to the ED for an evaluation or call 911).   ondansetron 4 MG tablet Commonly known as: ZOFRAN Take 1 tablet (4 mg total) by mouth every 6 (six) hours as needed for nausea.   oxyCODONE 5 MG immediate release tablet Commonly known as: Roxicodone Take 1 tablet (5 mg total) by mouth every 4 (four) hours as needed for severe pain or breakthrough pain.       Future Appointments  Date Time Provider [place]  03/25/2021 11:30 AM McKenzie, Candee Furbish, MD [facility] None  03/26/2021  1:45 PM Virl Cagey, MD [facility] None  09/10/2021 11:00 AM Satira Sark, MD [facility]       Signed: Virl Cagey 03/19/2021, 9:55 AM

## 2021-03-19 NOTE — Care Management Important Message (Signed)
Important Message  Patient Details  Name: [name] MRN: [id number] Date of Birth: 01-Nov-1960   Medicare Important Message Given:  Yes     Tommy Medal 03/19/2021, 11:50 AM

## 2021-03-19 NOTE — Progress Notes (Signed)
Nsg Discharge Note  Admit Date:  03/16/2021 Discharge date: 03/19/2021   MILO SCHREIER to be D/C'd Home per MD order.  AVS completed.  Copy for chart, and copy for patient signed, and dated. Patient/caregiver able to verbalize understanding.  Discharge Medication: Allergies as of 03/19/2021       Reactions   Topamax    Loss of control of his muscles   Topiramate    Loss of control of his muscles   Celecoxib    REACTION: heartburn   Codeine Nausea Only   Crestor [rosuvastatin] Hives   Azithromycin Diarrhea        Medication List     TAKE these medications    acetaminophen 500 MG tablet Commonly known as: TYLENOL Take 1,000 mg by mouth every 6 (six) hours as needed for moderate pain.   amLODipine 10 MG tablet Commonly known as: NORVASC Take 1 tablet (10 mg total) by mouth daily.   amoxicillin-clavulanate 875-125 MG tablet Commonly known as: Augmentin Take 1 tablet by mouth every 12 (twelve) hours for 2 days.   aspirin EC 81 MG tablet Take 1 tablet (81 mg total) by mouth daily.   atorvastatin 40 MG tablet Commonly known as: LIPITOR Take 1 tablet (40 mg total) by mouth daily.   ibuprofen 200 MG tablet Commonly known as: ADVIL Take 400 mg by mouth every 6 (six) hours as needed for moderate pain.   metoprolol succinate 50 MG 24 hr tablet Commonly known as: TOPROL-XL Take 1 tablet (50 mg total) by mouth daily. Take with or immediately following a meal.   nitroGLYCERIN 0.4 MG SL tablet Commonly known as: NITROSTAT Place 1 tablet (0.4 mg total) under the tongue every 5 (five) minutes x 3 doses as needed for chest pain (if no relief after 2nd dose, proceed to the ED for an evaluation or call 911).   ondansetron 4 MG tablet Commonly known as: ZOFRAN Take 1 tablet (4 mg total) by mouth every 6 (six) hours as needed for nausea.   oxyCODONE 5 MG immediate release tablet Commonly known as: Roxicodone Take 1 tablet (5 mg total) by mouth every 4 (four) hours as  needed for severe pain or breakthrough pain.        Discharge Assessment: Vitals:   03/19/21 0413 03/19/21 1053  BP: (!) 152/88 (!) 170/93  Pulse: 65 79  Resp: 20   Temp: 98.3 F (36.8 C)   SpO2: 97%    Skin clean, dry and intact without evidence of skin break down, no evidence of skin tears noted. IV catheter discontinued intact. Site without signs and symptoms of complications - no redness or edema noted at insertion site, patient denies c/o pain - only slight tenderness at site.  Dressing with slight pressure applied.  D/c Instructions-Education: Discharge instructions given to patient/family with verbalized understanding. D/c education completed with patient/family including follow up instructions, medication list, d/c activities limitations if indicated, with other d/c instructions as indicated by MD - patient able to verbalize understanding, all questions fully answered. Patient instructed to return to ED, call 911, or call MD for any changes in condition.  Patient escorted via [place], and D/C home via private auto.  Dorcas Mcmurray, LPN 1/60/1093 2:35 PM

## 2021-03-19 NOTE — Progress Notes (Signed)
Future Appointments  Date Time Provider [place]  03/25/2021 11:30 AM McKenzie, Candee Furbish, MD [facility] None  03/26/2021  1:45 PM Virl Cagey, MD RS[facility] None  09/10/2021 11:00 AM Satira Sark, MD [facility]

## 2021-03-19 NOTE — Discharge Instructions (Signed)
Discharge Open Abdominal Surgery Instructions:  Common Complaints: Pain at the incision site is common. This will improve with time. Take your pain medications as described below. Some nausea is common and poor appetite. The main goal is to stay hydrated the first few days after surgery.   Diet/ Activity: Diet as tolerated. You have started and tolerated a diet in the hospital, and should continue to increase what you are able to eat.   You may not have a large appetite, but it is important to stay hydrated. Drink 64 ounces of water a day. Your appetite will return with time.  Keep a dry dressing in place over your staples daily or as needed. Some minor pink/ blood tinged drainage is expected. This will stop in a few days after surgery.  Shower per your regular routine daily.  Do not take hot showers. Take warm showers that are less than 10 minutes. Pat the incision dry. Wear an abdominal binder daily with activity. You do not have to wear this while sleeping or sitting.  Rest and listen to your body, but do not remain in bed all day.  Walk everyday for at least 15-20 minutes. Deep cough and move around every 1-2 hours in the first few days after surgery.  Do not lift > 10 lbs, perform excessive bending, pushing, pulling, squatting for 6-8 weeks after surgery.  The activity restrictions and the abdominal binder are to prevent hernia formation at your incision while you are healing.  Do not place lotions or balms on your incision unless instructed to specifically by Dr. Tyffani Foglesong.   Pain Expectations and Narcotics: -After surgery you will have pain associated with your incisions and this is normal. The pain is muscular and nerve pain, and will get better with time. -You are encouraged and expected to take non narcotic medications like tylenol and ibuprofen (when able) to treat pain as multiple modalities can aid with pain treatment. -Narcotics are only used when pain is severe or there is  breakthrough pain. -You are not expected to have a pain score of 0 after surgery, as we cannot prevent pain. A pain score of 3-4 that allows you to be functional, move, walk, and tolerate some activity is the goal. The pain will continue to improve over the days after surgery and is dependent on your surgery. -Due to [place] law, we are only able to give a certain amount of pain medication to treat post operative pain, and we only give additional narcotics on a patient by patient basis.  -For most laparoscopic surgery, studies have shown that the majority of patients only need 10-15 narcotic pills, and for open surgeries most patients only need 15-20.   -Having appropriate expectations of pain and knowledge of pain management with non narcotics is important as we do not want anyone to become addicted to narcotic pain medication.  -Using ice packs in the first 48 hours and heating pads after 48 hours, wearing an abdominal binder (when recommended), and using over the counter medications are all ways to help with pain management.   -Simple acts like meditation and mindfulness practices after surgery can also help with pain control and research has proven the benefit of these practices.  Medication: Take tylenol and ibuprofen as needed for pain control, alternating every 4-6 hours.  Example:  Tylenol 1000mg @ 6am, 12noon, 6pm, 12midnight (Do not exceed 4000mg of tylenol a day). Ibuprofen 800mg @ 9am, 3pm, 9pm, 3am (Do not exceed 3600mg of ibuprofen a day).    Take Roxicodone for breakthrough pain every 4 hours.  Take Colace for constipation related to narcotic pain medication. If you do not have a bowel movement in 2 days, take Miralax over the counter.  Drink plenty of water to also prevent constipation.   Contact Information: If you have questions or concerns, please call our office, [phone], Monday- Thursday 8AM-5PM and Friday 8AM-12Noon.  If it is after hours or on the weekend, please call [facility]  Main Number, [phone], [phone], and ask to speak to the surgeon on call for Dr. Arley Salamone at [place].   

## 2021-03-25 ENCOUNTER — Encounter: Payer: Self-pay | Admitting: Urology

## 2021-03-25 ENCOUNTER — Ambulatory Visit (INDEPENDENT_AMBULATORY_CARE_PROVIDER_SITE_OTHER): Payer: Medicare Other | Admitting: Urology

## 2021-03-25 ENCOUNTER — Other Ambulatory Visit: Payer: Self-pay

## 2021-03-25 VITALS — BP 161/96 | HR 93 | Temp 98.8°F | Ht 66.0 in | Wt 142.2 lb

## 2021-03-25 DIAGNOSIS — N401 Enlarged prostate with lower urinary tract symptoms: Secondary | ICD-10-CM

## 2021-03-25 DIAGNOSIS — R300 Dysuria: Secondary | ICD-10-CM

## 2021-03-25 DIAGNOSIS — N138 Other obstructive and reflux uropathy: Secondary | ICD-10-CM

## 2021-03-25 NOTE — Progress Notes (Signed)
Urological Symptom Review  Patient is experiencing the following symptoms: Burning/pain with urination   Review of Systems  Gastrointestinal (upper)  : Negative for upper GI symptoms  Gastrointestinal (lower) : diarrhea  Constitutional : Negative for symptoms  Skin: Negative for skin symptoms  Eyes: Negative for eye symptoms  Ear/Nose/Throat : Negative for Ear/Nose/Throat symptoms  Hematologic/Lymphatic: Negative for Hematologic/Lymphatic symptoms  Cardiovascular : Negative for cardiovascular symptoms  Respiratory : Negative for respiratory symptoms  Endocrine: Negative for endocrine symptoms  Musculoskeletal: Negative for musculoskeletal symptoms  Neurological: Negative for neurological symptoms  Psychologic: Negative for psychiatric symptoms

## 2021-03-25 NOTE — Progress Notes (Signed)
Fill and Pull Catheter Removal  Patient is present today for a catheter removal.  Patient was cleaned and prepped in a sterile fashion 260ml of sterile water/ saline was instilled into the bladder when the patient felt the urge to urinate. 33ml of water was then drained from the balloon.  A 16FR foley cath was removed from the bladder no complications were noted .  Patient as then given some time to void on their own.  Patient can void  253ml on their own after some time.  Patient tolerated well.  Performed by: Estill Bamberg RN  Follow up/ Additional notes: MD in room to see patient.

## 2021-03-26 ENCOUNTER — Ambulatory Visit (INDEPENDENT_AMBULATORY_CARE_PROVIDER_SITE_OTHER): Payer: Medicare Other | Admitting: General Surgery

## 2021-03-26 ENCOUNTER — Encounter: Payer: Self-pay | Admitting: General Surgery

## 2021-03-26 VITALS — BP 164/100 | HR 101 | Temp 98.7°F | Resp 14 | Ht 67.0 in | Wt 143.0 lb

## 2021-03-26 DIAGNOSIS — N321 Vesicointestinal fistula: Secondary | ICD-10-CM

## 2021-03-26 DIAGNOSIS — R300 Dysuria: Secondary | ICD-10-CM

## 2021-03-26 DIAGNOSIS — R112 Nausea with vomiting, unspecified: Secondary | ICD-10-CM

## 2021-03-26 MED ORDER — OXYCODONE HCL 5 MG PO TABS: 5.0000 mg | ORAL_TABLET | ORAL | 0 refills | Status: DC | PRN

## 2021-03-26 NOTE — Progress Notes (Signed)
[facility]  Feeling poorly after getting catheter out bladder. Urine dark and says he is having nausea and vomiting. Last BM yesterday AM.  BP (!) 164/100   Pulse (!) 101   Temp 98.7 F (37.1 C) (Oral)   Resp 14   Ht 5\' 7"  (1.702 m)   Wt 143 lb (64.9 kg)   SpO2 98%   BMI 22.40 kg/m  Good work of breathing Abdomen soft, minimally distended, appropriately tender, staples removed, incisions c/d/I with no erythema or drainage, steri strips placed   Patient s/p open left hemicolectomy and bladder repair for colovesical fistula and diverticulitis.   Ok to take miralax if needed. Diet as tolerated. No heavy lifting > 10 lbs, excessive bending, pushing, pulling, or squatting for 6-8 weeks after surgery.  Go get lab work and urine sample and will let you know results in the AM. May need antibiotics or further work up.  If you cannot tolerate liquids and stay hydrated, go to the ED.   Future Appointments  Date Time Provider [place]  04/02/2021  1:00 PM Virl Cagey, MD [facility] None  05/25/2021 11:15 AM McKenzie, Candee Furbish, MD [facility] None  09/10/2021 11:00 AM Satira Sark, MD [facility]   Curlene Labrum, MD [facility] [address] [place], [place] [address] [phone] (office)

## 2021-03-26 NOTE — Patient Instructions (Signed)
Ok to take miralax if needed. Diet as tolerated. No heavy lifting > 10 lbs, excessive bending, pushing, pulling, or squatting for 6-8 weeks after surgery.  Go get lab work and urine sample and will let you know results in the AM. May need antibiotics or further work up.  If you cannot tolerate liquids and stay hydrated, go to the ED.

## 2021-03-27 LAB — URINALYSIS, ROUTINE W REFLEX MICROSCOPIC
Bilirubin, UA: NEGATIVE
Glucose, UA: NEGATIVE
Ketones, UA: NEGATIVE
Leukocytes,UA: NEGATIVE
Nitrite, UA: NEGATIVE
RBC, UA: NEGATIVE
Specific Gravity, UA: 1.016 (ref 1.005–1.030)
Urobilinogen, Ur: 0.2 mg/dL (ref 0.2–1.0)
pH, UA: 6 (ref 5.0–7.5)

## 2021-03-27 LAB — CBC WITH DIFFERENTIAL/PLATELET
Basophils Absolute: 0.1 10*3/uL (ref 0.0–0.2)
Basos: 1 %
EOS (ABSOLUTE): 0.2 10*3/uL (ref 0.0–0.4)
Eos: 3 %
Hematocrit: 38.8 % (ref 37.5–51.0)
Hemoglobin: 14.1 g/dL (ref 13.0–17.7)
Immature Grans (Abs): 0 10*3/uL (ref 0.0–0.1)
Immature Granulocytes: 0 %
Lymphocytes Absolute: 1.3 10*3/uL (ref 0.7–3.1)
Lymphs: 23 %
MCH: 35 pg — ABNORMAL HIGH (ref 26.6–33.0)
MCHC: 36.3 g/dL — ABNORMAL HIGH (ref 31.5–35.7)
MCV: 96 fL (ref 79–97)
Monocytes Absolute: 0.5 10*3/uL (ref 0.1–0.9)
Monocytes: 8 %
Neutrophils Absolute: 3.6 10*3/uL (ref 1.4–7.0)
Neutrophils: 65 %
Platelets: 263 10*3/uL (ref 150–450)
RBC: 4.03 x10E6/uL — ABNORMAL LOW (ref 4.14–5.80)
RDW: 11.3 % — ABNORMAL LOW (ref 11.6–15.4)
WBC: 5.6 10*3/uL (ref 3.4–10.8)

## 2021-03-27 LAB — BASIC METABOLIC PANEL
BUN/Creatinine Ratio: 5 — ABNORMAL LOW (ref 10–24)
BUN: 4 mg/dL — ABNORMAL LOW (ref 8–27)
CO2: 23 mmol/L (ref 20–29)
Calcium: 9.4 mg/dL (ref 8.6–10.2)
Chloride: 95 mmol/L — ABNORMAL LOW (ref 96–106)
Creatinine, Ser: 0.78 mg/dL (ref 0.76–1.27)
Glucose: 112 mg/dL — ABNORMAL HIGH (ref 70–99)
Potassium: 3.1 mmol/L — ABNORMAL LOW (ref 3.5–5.2)
Sodium: 137 mmol/L (ref 134–144)
eGFR: 101 mL/min/{1.73_m2} (ref >59)

## 2021-03-27 LAB — MICROSCOPIC EXAMINATION
Bacteria, UA: NONE SEEN
Casts: NONE SEEN /lpf
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
## 2021-04-02 ENCOUNTER — Encounter: Payer: Self-pay | Admitting: General Surgery

## 2021-04-02 ENCOUNTER — Ambulatory Visit (INDEPENDENT_AMBULATORY_CARE_PROVIDER_SITE_OTHER): Payer: Medicare Other | Admitting: General Surgery

## 2021-04-02 ENCOUNTER — Other Ambulatory Visit: Payer: Self-pay

## 2021-04-02 VITALS — BP 154/88 | HR 74 | Temp 98.5°F | Resp 14 | Ht 67.0 in | Wt 142.0 lb

## 2021-04-02 DIAGNOSIS — N321 Vesicointestinal fistula: Secondary | ICD-10-CM

## 2021-04-02 MED ORDER — CYCLOBENZAPRINE HCL 5 MG PO TABS: 5.0000 mg | ORAL_TABLET | Freq: Three times a day (TID) | ORAL | 1 refills | Status: DC | PRN

## 2021-04-02 NOTE — Patient Instructions (Signed)
Flexeril of muscle spasms No heavy lifting > 10 lbs, excessive bending, pushing, pulling, or squatting for 6-8 weeks after surgery.

## 2021-04-02 NOTE — Progress Notes (Signed)
[facility]  Doing much better. Eating and drink better. No further nausea and is having Bms. Feels some muscle spasms in the lower abdomen and pelvis at times.   BP (!) 154/88   Pulse 74   Temp 98.5 F (36.9 C) (Other (Comment))   Resp 14   Ht 5\' 7"  (1.702 m)   Wt 142 lb (64.4 kg)   SpO2 97%   BMI 22.24 kg/m  Incisions healing with steri strips, no erythema or drainage Minor induration midline   Patient s/p left hemicolectomy and bladder repair for colovesical fistula and sigmoid stricture. Doing better. Lab work and UA last week was reassuring.  Flexeril of muscle spasms No heavy lifting > 10 lbs, excessive bending, pushing, pulling, or squatting for 6-8 weeks after surgery.   Future Appointments  Date Time Provider [place]  05/05/2021 10:15 AM Virl Cagey, MD RS-RS None  05/25/2021 11:15 AM McKenzie, Candee Furbish, MD AUR-AUR None  09/10/2021 11:00 AM Satira Sark, MD CVD-ED[facility]     Curlene Labrum, MD [facility] [address] [place], [place] [address] [phone] (office)'

## 2021-05-05 ENCOUNTER — Encounter: Payer: Medicare Other | Admitting: General Surgery

## 2021-05-25 ENCOUNTER — Ambulatory Visit: Payer: Medicare Other | Admitting: Urology

## 2021-07-27 ENCOUNTER — Other Ambulatory Visit: Payer: Self-pay

## 2021-07-27 MED ORDER — METOPROLOL SUCCINATE ER 50 MG PO TB24: 50.0000 mg | ORAL_TABLET | Freq: Every day | ORAL | 1 refills | Status: DC

## 2021-09-10 ENCOUNTER — Ambulatory Visit: Payer: Medicare Other | Admitting: Cardiology

## 2022-01-18 ENCOUNTER — Other Ambulatory Visit: Payer: Self-pay | Admitting: Cardiology

## 2022-01-20 ENCOUNTER — Other Ambulatory Visit (HOSPITAL_COMMUNITY): Payer: Self-pay | Admitting: Specialist

## 2022-01-20 DIAGNOSIS — I739 Peripheral vascular disease, unspecified: Secondary | ICD-10-CM

## 2022-02-01 ENCOUNTER — Ambulatory Visit (HOSPITAL_COMMUNITY)
Admission: RE | Admit: 2022-02-01 | Discharge: 2022-02-01 | Disposition: A | Discharge status: Home / Self Care | Payer: Medicare Other | Source: Ambulatory Visit | Attending: Internal Medicine | Admitting: Internal Medicine

## 2022-02-01 DIAGNOSIS — I739 Peripheral vascular disease, unspecified: Secondary | ICD-10-CM | POA: Insufficient documentation

## 2022-02-10 ENCOUNTER — Encounter (HOSPITAL_COMMUNITY): Payer: Self-pay

## 2022-02-24 ENCOUNTER — Encounter: Payer: Self-pay | Admitting: *Deleted

## 2022-02-24 ENCOUNTER — Other Ambulatory Visit: Payer: Self-pay | Admitting: *Deleted

## 2022-02-24 NOTE — Patient Outreach (Signed)
  Care Coordination   Initial Visit Note   02/24/2022 Name: [name] MRN: [id number] DOB: 1961-02-08  [name] is a 60 y.o. year old male who sees Christopher Blitz, MD for primary care. I spoke with  [name] by phone today.  What matters to the patients health and wellness today?  Report he has PVD, recently had surgery to open blocked artery in leg.  Denies having chest pain or discomfort, denies any complications from surgery.  No longer active with [facility], was looking into [facility] for new provider.  Given contact information, encouraged to call as soon as possible.     Goals Addressed               This Visit's Progress     Recover from surgery for PVD (pt-stated)        Care Coordination Interventions: Evaluation of current treatment plan related to PVD and patient's adherence to plan as established by provider Advised patient to Contact [facility] to establish with new PCP for AWV Reviewed scheduled/upcoming provider appointments including follow up with vascular on 9/12 Assessed social determinant of health barriers         SDOH assessments and interventions completed:  Yes  SDOH Interventions Today    Flowsheet Row Most Recent Value  SDOH Interventions   Food Insecurity Interventions Intervention Not Indicated  Housing Interventions Intervention Not Indicated  Transportation Interventions Intervention Not Indicated        Care Coordination Interventions Activated:  Yes  Care Coordination Interventions:  Yes, provided   Follow up plan: Follow up call scheduled for 9/13    Encounter Outcome:  Pt. Visit Completed   Valente David, RN, MSN, [place] Care Management Care Management Coordinator [phone]

## 2022-02-24 NOTE — Patient Instructions (Signed)
Visit Information  Thank you for taking time to visit with me today. Please don't hesitate to contact me if I can be of assistance to you before our next scheduled telephone appointment.  Following are the goals we discussed today:  Call [facility] to schedule office visit and AWV  Our next appointment is by telephone on 9/13  Please call the care guide team at [phone] if you need to cancel or reschedule your appointment.   Please call the Suicide and Crisis Lifeline: 988 call the [place] National Suicide Prevention Lifeline: 267-050-4937 or TTY: 848-327-9771 TTY 304-270-0954) to talk to a trained counselor call 1-800-273-TALK (toll free, 24 hour hotline) call the [facility]: [phone] if you are experiencing a [place] or [place] or need someone to talk to.  Patient verbalizes understanding of instructions and care plan provided today and agrees to view in [facility]. Active MyChart status and patient understanding of how to access instructions and care plan via MyChart confirmed with patient.     The patient has been provided with contact information for the care management team and has been advised to call with any health related questions or concerns.   Valente David, RN, MSN, [place] Care Management Care Management Coordinator [phone]

## 2022-03-02 ENCOUNTER — Encounter: Payer: Self-pay | Admitting: Vascular Surgery

## 2022-03-02 ENCOUNTER — Ambulatory Visit (INDEPENDENT_AMBULATORY_CARE_PROVIDER_SITE_OTHER): Payer: Medicare Other | Admitting: Vascular Surgery

## 2022-03-02 VITALS — BP 157/98 | HR 81 | Temp 98.3°F | Ht 66.0 in | Wt 148.5 lb

## 2022-03-02 DIAGNOSIS — I739 Peripheral vascular disease, unspecified: Secondary | ICD-10-CM | POA: Diagnosis not present
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
## 2022-03-04 ENCOUNTER — Other Ambulatory Visit: Payer: Self-pay

## 2022-03-04 DIAGNOSIS — I739 Peripheral vascular disease, unspecified: Secondary | ICD-10-CM

## 2022-03-09 ENCOUNTER — Encounter: Payer: Medicare Other | Admitting: Vascular Surgery

## 2022-03-10 ENCOUNTER — Ambulatory Visit: Payer: Self-pay | Admitting: *Deleted

## 2022-03-10 NOTE — Patient Instructions (Signed)
Visit Information  Thank you for taking time to visit with me today. Please don't hesitate to contact me if I can be of assistance to you before our next scheduled telephone appointment.  Following are the goals we discussed today:  Create account with new email address for MyChart.  Our next appointment is by telephone on 10/9  Please call the care guide team at [phone] if you need to cancel or reschedule your appointment.   Please call the Suicide and Crisis Lifeline: 988 call the [place] National Suicide Prevention Lifeline: (706)814-0820 or TTY: (205) 606-8147 TTY 605 561 3742) to talk to a trained counselor call 1-800-273-TALK (toll free, 24 hour hotline) call the [facility]: ([phone] call 911 if you are experiencing a Mental Health or [place] or need someone to talk to.  Patient verbalizes understanding of instructions and care plan provided today and agrees to view in [place]. Active MyChart status and patient understanding of how to access instructions and care plan via MyChart confirmed with patient.     The patient has been provided with contact information for the care management team and has been advised to call with any health related questions or concerns.   Valente David, RN, MSN, [place] Care Management Care Management Coordinator [phone]

## 2022-03-10 NOTE — Patient Outreach (Signed)
  Care Coordination   Follow Up Visit Note   03/10/2022 Name: [name] MRN: [id number] DOB: May 09, 1961  [name] is a 61 y.o. year old male who sees Monico Blitz, MD for primary care. I spoke with  [name] by phone today.  What matters to the patients health and wellness today?  State he continues to recover well, was seen by vascular yesterday, no urgent concerns noted.  Denies any urgent concerns, encouraged to contact this care manager with questions.     Goals Addressed               This Visit's Progress     Recover from surgery for PVD (pt-stated)   On track     Care Coordination Interventions: Evaluation of current treatment plan related to PVD and patient's adherence to plan as established by provider Advised patient to Keep new PCP appointment for tomorrow with Dr. Doren Custard, encouraged to schedule AWV as well Reviewed scheduled/upcoming provider appointments including follow up with vascular on 9/12 completed, next follow up scheduled for 10/3 Assessed social determinant of health barriers Placed on schedule of newly assigned RNCM for 10/9 Assisted with setting up MyChart for online medical chart information and education         SDOH assessments and interventions completed:  No     Care Coordination Interventions Activated:  Yes  Care Coordination Interventions:  Yes, provided   Follow up plan: Follow up call scheduled for 10/9    Encounter Outcome:  Pt. Visit Completed   Valente David, RN, MSN, [place] Care Management Care Management Coordinator [phone]

## 2022-03-11 ENCOUNTER — Encounter: Payer: Self-pay | Admitting: Internal Medicine

## 2022-03-11 ENCOUNTER — Ambulatory Visit (INDEPENDENT_AMBULATORY_CARE_PROVIDER_SITE_OTHER): Payer: Medicare Other | Admitting: Internal Medicine

## 2022-03-11 VITALS — BP 138/84 | HR 78 | Ht 66.0 in | Wt 148.0 lb

## 2022-03-11 DIAGNOSIS — M545 Low back pain, unspecified: Secondary | ICD-10-CM

## 2022-03-11 DIAGNOSIS — Z0000 Encounter for general adult medical examination without abnormal findings: Secondary | ICD-10-CM | POA: Diagnosis not present

## 2022-03-11 DIAGNOSIS — E7800 Pure hypercholesterolemia, unspecified: Secondary | ICD-10-CM

## 2022-03-11 DIAGNOSIS — I25119 Atherosclerotic heart disease of native coronary artery with unspecified angina pectoris: Secondary | ICD-10-CM

## 2022-03-11 DIAGNOSIS — G8929 Other chronic pain: Secondary | ICD-10-CM

## 2022-03-11 DIAGNOSIS — K219 Gastro-esophageal reflux disease without esophagitis: Secondary | ICD-10-CM

## 2022-03-11 DIAGNOSIS — Z7902 Long term (current) use of antithrombotics/antiplatelets: Secondary | ICD-10-CM

## 2022-03-11 DIAGNOSIS — I739 Peripheral vascular disease, unspecified: Secondary | ICD-10-CM

## 2022-03-11 DIAGNOSIS — Z131 Encounter for screening for diabetes mellitus: Secondary | ICD-10-CM

## 2022-03-11 DIAGNOSIS — R7301 Impaired fasting glucose: Secondary | ICD-10-CM

## 2022-03-11 DIAGNOSIS — F17200 Nicotine dependence, unspecified, uncomplicated: Secondary | ICD-10-CM

## 2022-03-11 DIAGNOSIS — Z23 Encounter for immunization: Secondary | ICD-10-CM | POA: Diagnosis not present

## 2022-03-11 DIAGNOSIS — I10 Essential (primary) hypertension: Secondary | ICD-10-CM

## 2022-03-11 DIAGNOSIS — F418 Other specified anxiety disorders: Secondary | ICD-10-CM

## 2022-03-11 MED ORDER — CARVEDILOL 6.25 MG PO TABS: 6.2500 mg | ORAL_TABLET | Freq: Two times a day (BID) | ORAL | 2 refills | Status: DC

## 2022-03-11 MED ORDER — AMLODIPINE BESYLATE 10 MG PO TABS: 10.0000 mg | ORAL_TABLET | Freq: Every day | ORAL | 6 refills | Status: DC

## 2022-03-11 MED ORDER — ATORVASTATIN CALCIUM 40 MG PO TABS: 40.0000 mg | ORAL_TABLET | Freq: Every day | ORAL | 1 refills | Status: DC

## 2022-03-11 MED ORDER — CLOPIDOGREL BISULFATE 75 MG PO TABS: 75.0000 mg | ORAL_TABLET | Freq: Every day | ORAL | 1 refills | Status: DC

## 2022-03-11 NOTE — Assessment & Plan Note (Signed)
BP 150/80 initially, improved to 138/84 on repeat.  His current regimen consist of amlodipine 10 mg daily and carvedilol 6.25 mg twice daily. -No changes today -Consider escalation of therapy at follow-up if BP remains significantly above 130/80

## 2022-03-11 NOTE — Assessment & Plan Note (Signed)
Currently prescribed atorvastatin 40 mg daily. -Repeat lipid panel today -Atorvastatin refilled

## 2022-03-11 NOTE — Assessment & Plan Note (Signed)
Chronic issue.  Followed by Dr. Maxie Better at [facility].  He is planning to call to schedule follow-up.  He was told by vascular surgery that his lower extremity symptoms may be due in part to spinal stenosis.

## 2022-03-11 NOTE — Assessment & Plan Note (Signed)
Denies any symptoms of anxiety or depression currently.  PHQ 2 score 0.  Not currently on any medication.

## 2022-03-11 NOTE — Assessment & Plan Note (Signed)
[name] states that he quit smoking 4 weeks ago.  He has been smoking since age 61, mostly 1 pack/day.  He has been able to quit with the assistance of nicotine patches. -I congratulated [name] on smoking cessation and encouraged him to stick with this significant lifestyle change.  He is extremely motivated to abstain from cigarette use given his recent surgery, which he was told is largely associated with his significant smoking history.

## 2022-03-11 NOTE — Assessment & Plan Note (Signed)
Presenting today to establish care -Basic labs ordered today, including one-time HCV screening -Flu shot administered today -Shingrix and COVID 19 booster vaccines to be obtained at his pharmacy -Tdap up-to-date -Per chart review, he underwent screening colonoscopy in August 2022.  Repeat colonoscopy recommended for 1 year.  He will call his gastroenterologist to schedule follow-up.

## 2022-03-11 NOTE — Patient Instructions (Signed)
It was a pleasure to see you today.  Thank you for giving [place] the opportunity to be involved in your care.  Below is a brief recap of your visit and next steps.  We will plan to see you again in 3 months.  Summary We will check basic labs today You will also receive your flu shot  Next steps Follow up in 3 months I will notify you of labs I recommend you schedule follow up with your GI doctor for repeat colonoscopy.

## 2022-03-11 NOTE — Assessment & Plan Note (Signed)
Symptoms well controlled on Nexium.  No changes today.
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
## 2022-03-11 NOTE — Assessment & Plan Note (Signed)
Followed by vascular surgery.  Recently underwent axillobifemoral bypass in [place] 4 weeks ago after his feet became painful and cyanotic while attending a funeral.  He reports significant improvement in symptoms following surgery. -Continue ASA/Plavix and statin -Continue carvedilol -Vascular surgery follow-up scheduled for 10/3 with ultrasound and ABI to be performed the same day for surveillance

## 2022-03-11 NOTE — Assessment & Plan Note (Signed)
S/p multiple prior MIs and multiple stents.  Last seen by cardiology 1 year ago.  Denies any recent chest pain. -Continue ASA/Plavix -Continue atorvastatin -Continue carvedilol

## 2022-03-13 LAB — CMP14+EGFR
ALT: 19 IU/L (ref 0–44)
AST: 20 IU/L (ref 0–40)
Albumin/Globulin Ratio: 1.6 (ref 1.2–2.2)
Albumin: 4.6 g/dL (ref 3.9–4.9)
Alkaline Phosphatase: 112 IU/L (ref 44–121)
BUN/Creatinine Ratio: 11 (ref 10–24)
BUN: 9 mg/dL (ref 8–27)
Bilirubin Total: 0.3 mg/dL (ref 0.0–1.2)
CO2: 22 mmol/L (ref 20–29)
Calcium: 9.7 mg/dL (ref 8.6–10.2)
Chloride: 102 mmol/L (ref 96–106)
Creatinine, Ser: 0.81 mg/dL (ref 0.76–1.27)
Globulin, Total: 2.8 g/dL (ref 1.5–4.5)
Glucose: 101 mg/dL — ABNORMAL HIGH (ref 70–99)
Potassium: 4.3 mmol/L (ref 3.5–5.2)
Sodium: 141 mmol/L (ref 134–144)
Total Protein: 7.4 g/dL (ref 6.0–8.5)
eGFR: 99 mL/min/{1.73_m2} (ref >59)

## 2022-03-13 LAB — CBC WITH DIFFERENTIAL/PLATELET
Basophils Absolute: 0.1 10*3/uL (ref 0.0–0.2)
Basos: 1 %
EOS (ABSOLUTE): 0.2 10*3/uL (ref 0.0–0.4)
Eos: 3 %
Hematocrit: 42 % (ref 37.5–51.0)
Hemoglobin: 14 g/dL (ref 13.0–17.7)
Immature Grans (Abs): 0 10*3/uL (ref 0.0–0.1)
Immature Granulocytes: 0 %
Lymphocytes Absolute: 1.7 10*3/uL (ref 0.7–3.1)
Lymphs: 26 %
MCH: 33.1 pg — ABNORMAL HIGH (ref 26.6–33.0)
MCHC: 33.3 g/dL (ref 31.5–35.7)
MCV: 99 fL — ABNORMAL HIGH (ref 79–97)
Monocytes Absolute: 0.3 10*3/uL (ref 0.1–0.9)
Monocytes: 5 %
NRBC: 1 % — ABNORMAL HIGH (ref 0–0)
Neutrophils Absolute: 4.1 10*3/uL (ref 1.4–7.0)
Neutrophils: 65 %
Platelets: 118 10*3/uL — ABNORMAL LOW (ref 150–450)
RBC: 4.23 x10E6/uL (ref 4.14–5.80)
RDW: 11.8 % (ref 11.6–15.4)
WBC: 6.3 10*3/uL (ref 3.4–10.8)

## 2022-03-13 LAB — LIPID PANEL
Chol/HDL Ratio: 3.5 ratio (ref 0.0–5.0)
Cholesterol, Total: 191 mg/dL (ref 100–199)
HDL: 55 mg/dL (ref >39)
LDL Chol Calc (NIH): 102 mg/dL — ABNORMAL HIGH (ref 0–99)
Triglycerides: 199 mg/dL — ABNORMAL HIGH (ref 0–149)
VLDL Cholesterol Cal: 34 mg/dL (ref 5–40)

## 2022-03-13 LAB — HCV INTERPRETATION

## 2022-03-13 LAB — HEMOGLOBIN A1C
Est. average glucose Bld gHb Est-mCnc: 103 mg/dL
Hgb A1c MFr Bld: 5.2 % (ref 4.8–5.6)

## 2022-03-13 LAB — HCV AB W REFLEX TO QUANT PCR: HCV Ab: NONREACTIVE

## 2022-03-16 ENCOUNTER — Encounter: Payer: Self-pay | Admitting: Gastroenterology
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
## 2022-03-30 ENCOUNTER — Ambulatory Visit (INDEPENDENT_AMBULATORY_CARE_PROVIDER_SITE_OTHER): Payer: Medicare Other | Admitting: Internal Medicine

## 2022-03-30 ENCOUNTER — Ambulatory Visit (INDEPENDENT_AMBULATORY_CARE_PROVIDER_SITE_OTHER): Payer: Medicare Other | Admitting: Vascular Surgery

## 2022-03-30 ENCOUNTER — Encounter: Payer: Self-pay | Admitting: Internal Medicine

## 2022-03-30 ENCOUNTER — Encounter: Payer: Self-pay | Admitting: Vascular Surgery

## 2022-03-30 ENCOUNTER — Ambulatory Visit (INDEPENDENT_AMBULATORY_CARE_PROVIDER_SITE_OTHER)
Admission: RE | Admit: 2022-03-30 | Discharge: 2022-03-30 | Disposition: A | Discharge status: Home / Self Care | Payer: Medicare Other | Source: Ambulatory Visit | Attending: Vascular Surgery | Admitting: Vascular Surgery

## 2022-03-30 ENCOUNTER — Ambulatory Visit (HOSPITAL_COMMUNITY)
Admission: RE | Admit: 2022-03-30 | Discharge: 2022-03-30 | Disposition: A | Discharge status: Home / Self Care | Payer: Medicare Other | Source: Ambulatory Visit | Attending: Vascular Surgery | Admitting: Vascular Surgery

## 2022-03-30 VITALS — BP 160/94 | HR 76 | Temp 99.2°F | Resp 20 | Ht 66.0 in | Wt 148.0 lb

## 2022-03-30 DIAGNOSIS — I739 Peripheral vascular disease, unspecified: Secondary | ICD-10-CM

## 2022-03-30 DIAGNOSIS — Z0000 Encounter for general adult medical examination without abnormal findings: Secondary | ICD-10-CM | POA: Diagnosis not present

## 2022-03-30 NOTE — Patient Instructions (Signed)
  [name] , Thank you for taking time to come for your Medicare Wellness Visit. I appreciate your ongoing commitment to your health goals. Please review the following plan we discussed and let me know if I can assist you in the future.   These are the goals we discussed: We discussed continuing to avoid smoking cigarettes. If you are unable to quit completely then you will discuss starting medications which may help with your PCP. In addition you will discuss lung cancer screening at your appointment in December. You can obtain the Shingles Vaccine and Covid Booster at your pharmacy. Lastly you will follow up with GI doctor to schedule your colonoscopy.    Goals       Quit Smoking      Recover from surgery for PVD (pt-stated)      Care Coordination Interventions: Evaluation of current treatment plan related to PVD and patient's adherence to plan as established by provider Advised patient to Keep new PCP appointment for tomorrow with Dr. Doren Custard, encouraged to schedule AWV as well Reviewed scheduled/upcoming provider appointments including follow up with vascular on 9/12 completed, next follow up scheduled for 10/3 Assessed social determinant of health barriers Placed on schedule of newly assigned RNCM for 10/9 Assisted with setting up MyChart for online medical chart information and education         This is a list of the screening recommended for you and due dates:  Health Maintenance  Topic Date Due   COVID-19 Vaccine (3 - Moderna risk series) 10/02/2020   Colon Cancer Screening  01/26/2022   Zoster (Shingles) Vaccine (1 of 2) 06/10/2022*   Tetanus Vaccine  06/29/2023   Flu Shot  Completed   Hepatitis C Screening: USPSTF Recommendation to screen - Ages 18-79 yo.  Completed   HIV Screening  Completed   HPV Vaccine  Aged Out  *Topic was postponed. The date shown is not the original due date.
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
## 2022-03-31 ENCOUNTER — Other Ambulatory Visit: Payer: Self-pay

## 2022-03-31 DIAGNOSIS — I739 Peripheral vascular disease, unspecified: Secondary | ICD-10-CM

## 2022-04-05 ENCOUNTER — Ambulatory Visit: Payer: Self-pay | Admitting: *Deleted

## 2022-04-05 NOTE — Patient Outreach (Signed)
  Care Coordination   04/05/2022 Name: [name] MRN: [id number] DOB: 22-May-1961   Care Coordination Outreach Attempts:  An unsuccessful telephone outreach was attempted today to offer the patient information about available care coordination services as a benefit of their health plan.   Follow Up Plan:  Additional outreach attempts will be made to offer the patient care coordination information and services.   Encounter Outcome:  No Answer  Care Coordination Interventions Activated:  No   Care Coordination Interventions:  No, not indicated    SIG Izzy Doubek L. Lavina Hamman, RN, BSN, [place] Coordinator Office number [phone]

## 2022-04-12 ENCOUNTER — Ambulatory Visit: Payer: Self-pay | Admitting: *Deleted

## 2022-04-12 NOTE — Patient Outreach (Signed)
  Care Coordination   04/12/2022 Name: [name] MRN: [id number] DOB: 30-Nov-1960   Care Coordination Outreach Attempts:  A second unsuccessful outreach was attempted today to offer the patient with information about available care coordination services as a benefit of their health plan.     Follow Up Plan:  Additional outreach attempts will be made to offer the patient care coordination information and services.   Encounter Outcome:  No Answer  Care Coordination Interventions Activated:  No   Care Coordination Interventions:  No, not indicated     [name] L. Lavina Hamman, RN, BSN, [place] Coordinator Office number [phone]

## 2022-04-16 ENCOUNTER — Ambulatory Visit: Payer: Self-pay | Admitting: *Deleted

## 2022-04-16 NOTE — Patient Outreach (Signed)
  Care Coordination   04/16/2022 Name: [name] MRN: [id number] DOB: 01/31/1961   Care Coordination Outreach Attempts:  A third unsuccessful outreach was attempted today to offer the patient with information about available care coordination services as a benefit of their health plan.   Follow Up Plan:  No further outreach attempts will be made at this time. We have been unable to contact the patient to offer or enroll patient in care coordination services  Encounter Outcome:  No Answer  Care Coordination Interventions Activated:  No   Care Coordination Interventions:  No, not indicated     Jesselle Laflamme L. Lavina Hamman, RN, BSN, [place] Coordinator Office number [phone]

## 2022-04-20 ENCOUNTER — Ambulatory Visit (INDEPENDENT_AMBULATORY_CARE_PROVIDER_SITE_OTHER): Payer: Medicare Other | Admitting: Internal Medicine

## 2022-04-20 ENCOUNTER — Encounter: Payer: Self-pay | Admitting: Internal Medicine

## 2022-04-20 VITALS — BP 166/88 | HR 86 | Ht 66.0 in | Wt 150.6 lb

## 2022-04-20 DIAGNOSIS — I739 Peripheral vascular disease, unspecified: Secondary | ICD-10-CM | POA: Diagnosis not present

## 2022-04-20 DIAGNOSIS — I25119 Atherosclerotic heart disease of native coronary artery with unspecified angina pectoris: Secondary | ICD-10-CM

## 2022-04-20 DIAGNOSIS — Z0001 Encounter for general adult medical examination with abnormal findings: Secondary | ICD-10-CM

## 2022-04-20 DIAGNOSIS — G8929 Other chronic pain: Secondary | ICD-10-CM

## 2022-04-20 DIAGNOSIS — Z1211 Encounter for screening for malignant neoplasm of colon: Secondary | ICD-10-CM

## 2022-04-20 DIAGNOSIS — Z1212 Encounter for screening for malignant neoplasm of rectum: Secondary | ICD-10-CM

## 2022-04-20 DIAGNOSIS — I10 Essential (primary) hypertension: Secondary | ICD-10-CM

## 2022-04-20 DIAGNOSIS — F17200 Nicotine dependence, unspecified, uncomplicated: Secondary | ICD-10-CM

## 2022-04-20 DIAGNOSIS — Z122 Encounter for screening for malignant neoplasm of respiratory organs: Secondary | ICD-10-CM

## 2022-04-20 DIAGNOSIS — M792 Neuralgia and neuritis, unspecified: Secondary | ICD-10-CM | POA: Diagnosis not present

## 2022-04-20 DIAGNOSIS — Z0000 Encounter for general adult medical examination without abnormal findings: Secondary | ICD-10-CM

## 2022-04-20 MED ORDER — CARVEDILOL 6.25 MG PO TABS: 12.5000 mg | ORAL_TABLET | Freq: Two times a day (BID) | ORAL | 2 refills | Status: DC

## 2022-04-20 MED ORDER — GABAPENTIN 300 MG PO CAPS: 300.0000 mg | ORAL_CAPSULE | Freq: Three times a day (TID) | ORAL | 0 refills | Status: DC

## 2022-04-20 NOTE — Assessment & Plan Note (Signed)
Returning to care today. -I have placed a referral for lung cancer screening given that he is a current smoker with a 40-pack-year history -He underwent screening colonoscopy in August 2022.  Repeat colonoscopy recommended for 1 year.  I recommended that he contact his gastroenterologist to schedule a follow-up appointment for repeat colonoscopy.

## 2022-04-20 NOTE — Assessment & Plan Note (Signed)
BP 166/88 today.  He returns for a BP check.  His current regimen consists of amlodipine 10 mg daily and carvedilol 6.25 mg twice daily.  He is not regularly checking his blood pressure at home. -Increase carvedilol to 12.5 mg twice daily.  Continue amlodipine 10 mg daily. -Follow-up in 4 weeks for BP check.  In the interim, I have encouraged him to check his blood pressure at home and to keep a log to bring with him to his next appointment.

## 2022-04-20 NOTE — Patient Instructions (Signed)
It was a pleasure to see you today.  Thank you for giving [place] the opportunity to be involved in your care.  Below is a brief recap of your visit and next steps.  We will plan to see you again in 4 weeks.  Summary Increase carvedilol to 12.5 mg twice daily I prescribed gabapentin 300 mg three times daily I have referred to the lung cancer screening program because of your smoking history I recommend you call your GI doctor to schedule follow up  Next steps Follow up in 4 weeks for BP check
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
## 2022-04-20 NOTE — Assessment & Plan Note (Signed)
He describes neuropathic pain in his upper and lower extremities bilaterally that has significantly worsened since his vascular procedures.  He is currently taking ibuprofen 400 mg every 6 hours for relief.  He states he was previously prescribed gabapentin 300 mg 3 times daily by his orthopedic surgeon, which was more effective in relieving his pain. -Gabapentin 300 mg 3 times daily prescribed today -Follow-up in 4 weeks for reassessment

## 2022-05-10 ENCOUNTER — Other Ambulatory Visit: Payer: Self-pay | Admitting: Internal Medicine

## 2022-05-10 DIAGNOSIS — G8929 Other chronic pain: Secondary | ICD-10-CM

## 2022-05-18 ENCOUNTER — Telehealth: Payer: Self-pay | Admitting: Internal Medicine

## 2022-05-18 ENCOUNTER — Ambulatory Visit (INDEPENDENT_AMBULATORY_CARE_PROVIDER_SITE_OTHER): Payer: Medicare Other | Admitting: Internal Medicine

## 2022-05-18 ENCOUNTER — Encounter: Payer: Self-pay | Admitting: Internal Medicine

## 2022-05-18 VITALS — BP 110/74 | HR 78 | Ht 66.0 in | Wt 156.2 lb

## 2022-05-18 DIAGNOSIS — Z122 Encounter for screening for malignant neoplasm of respiratory organs: Secondary | ICD-10-CM | POA: Diagnosis not present

## 2022-05-18 DIAGNOSIS — I10 Essential (primary) hypertension: Secondary | ICD-10-CM | POA: Diagnosis not present

## 2022-05-18 DIAGNOSIS — I739 Peripheral vascular disease, unspecified: Secondary | ICD-10-CM

## 2022-05-18 DIAGNOSIS — Z1211 Encounter for screening for malignant neoplasm of colon: Secondary | ICD-10-CM | POA: Insufficient documentation

## 2022-05-18 DIAGNOSIS — Z87891 Personal history of nicotine dependence: Secondary | ICD-10-CM | POA: Diagnosis not present

## 2022-05-18 DIAGNOSIS — G8929 Other chronic pain: Secondary | ICD-10-CM

## 2022-05-18 DIAGNOSIS — Z1212 Encounter for screening for malignant neoplasm of rectum: Secondary | ICD-10-CM

## 2022-05-18 DIAGNOSIS — M792 Neuralgia and neuritis, unspecified: Secondary | ICD-10-CM

## 2022-05-18 DIAGNOSIS — I25119 Atherosclerotic heart disease of native coronary artery with unspecified angina pectoris: Secondary | ICD-10-CM

## 2022-05-18 DIAGNOSIS — F17200 Nicotine dependence, unspecified, uncomplicated: Secondary | ICD-10-CM

## 2022-05-18 MED ORDER — ATORVASTATIN CALCIUM 40 MG PO TABS: 40.0000 mg | ORAL_TABLET | Freq: Every day | ORAL | 1 refills | Status: DC

## 2022-05-18 MED ORDER — DULOXETINE HCL 30 MG PO CPEP: 30.0000 mg | ORAL_CAPSULE | Freq: Every day | ORAL | 0 refills | Status: DC

## 2022-05-18 MED ORDER — CARVEDILOL 12.5 MG PO TABS: 12.5000 mg | ORAL_TABLET | Freq: Two times a day (BID) | ORAL | 1 refills | Status: DC

## 2022-05-18 MED ORDER — GABAPENTIN 600 MG PO TABS: 600.0000 mg | ORAL_TABLET | Freq: Three times a day (TID) | ORAL | 0 refills | Status: DC
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
## 2022-05-18 NOTE — Patient Instructions (Signed)
It was a pleasure to see you today.  Thank you for giving [place] the opportunity to be involved in your care.  Below is a brief recap of your visit and next steps.  We will plan to see you again in 2 weeks.  Summary Increase gabapentin to 600 mg three times weekly We will start Cymbalta 30 mg daily for pain relief, OK to increase to 60 mg daily after one week if needed Your blood pressure looks great today. No medication changes. We will follow up in 2 weeks for telephone call

## 2022-05-18 NOTE — Assessment & Plan Note (Signed)
Carvedilol was increased to 12.5 mg twice daily at his last appointment.  Blood pressure today is 110/74.  He is also taking amlodipine 10 mg daily.  No medication changes today.  Continue current regimen.

## 2022-05-18 NOTE — Assessment & Plan Note (Signed)
He recently quit smoking but reports a prior history of smoking 1 pack/day since age 61.  He meets criteria for lung cancer screening.  LDCT was ordered today.

## 2022-05-18 NOTE — Telephone Encounter (Signed)
Kara Mead, [place] stating she is needing clarification on medication sent in today

## 2022-05-18 NOTE — Assessment & Plan Note (Signed)
GI referral placed today for screening colonoscopy
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
## 2022-05-19 LAB — B12 AND FOLATE PANEL
Folate: 4.8 ng/mL (ref >3.0)
Vitamin B-12: 244 pg/mL (ref 232–1245)

## 2022-05-28 ENCOUNTER — Other Ambulatory Visit: Payer: Self-pay | Admitting: Internal Medicine

## 2022-05-28 DIAGNOSIS — I739 Peripheral vascular disease, unspecified: Secondary | ICD-10-CM

## 2022-05-28 DIAGNOSIS — I25119 Atherosclerotic heart disease of native coronary artery with unspecified angina pectoris: Secondary | ICD-10-CM

## 2022-06-02 ENCOUNTER — Ambulatory Visit (INDEPENDENT_AMBULATORY_CARE_PROVIDER_SITE_OTHER): Payer: Medicare Other | Admitting: Internal Medicine

## 2022-06-02 ENCOUNTER — Encounter: Payer: Self-pay | Admitting: Internal Medicine

## 2022-06-02 DIAGNOSIS — M792 Neuralgia and neuritis, unspecified: Secondary | ICD-10-CM | POA: Diagnosis not present

## 2022-06-02 DIAGNOSIS — M62838 Other muscle spasm: Secondary | ICD-10-CM | POA: Diagnosis not present

## 2022-06-02 DIAGNOSIS — G8929 Other chronic pain: Secondary | ICD-10-CM

## 2022-06-02 MED ORDER — CYCLOBENZAPRINE HCL 5 MG PO TABS: 5.0000 mg | ORAL_TABLET | Freq: Every evening | ORAL | 0 refills | Status: DC | PRN

## 2022-06-02 NOTE — Progress Notes (Signed)
   Established Patient Telephone Encounter  Virtual Visit via Telephone Note  I connected with [name] on 06/02/22 at  4:20 PM EST by telephone and verified that I am speaking with the correct person using two identifiers.  Location: Patient: [name] [id number] Provider: [phone], [place] [address]   I discussed the limitations, risks, security and privacy concerns of performing an evaluation and management service by telephone and the availability of in person appointments. I also discussed with the patient that there may be a patient responsible charge related to this service. The patient expressed understanding and agreed to proceed.   History of Present Illness:  [name] has been evaluated today for follow up vai telephone encounter for neuropathic pain. He was last seen by me on 11/21 at which time gabapentin was increased to 600 mg 3 times daily and Cymbalta was started.  I also recommended adding vitamin B12 supplementation.  Despite these measures, his neuropathic pain has persisted.  Today he denies any improvement.  His pain is most bothersome at night is associated cramping.  His pain seemed to worsen this fall after undergoing axillobifemoral bypass in [place] in August.  Has been seen by vascular surgery in follow-up and noninvasive reassuring.   Assessment and Plan:  Chronic neuropathic pain He has not experienced any symptomatic improvement with increasing gabapentin adding Cymbalta, and starting B12 supplementation.  At this point I will refer him to neurology for further work-up and evaluation.  Given reports of muscle cramping at night, I have also prescribed Flexeril 5 mg nightly as needed.  He is in agreement with this plan.  Plan for follow-up in January.  Follow Up Instructions:  I discussed the assessment and treatment plan with the patient. The patient was provided an opportunity to ask questions and all were answered. The  patient agreed with the plan and demonstrated an understanding of the instructions.   The patient was advised to call back or seek an in-person evaluation if the symptoms worsen or if the condition fails to improve as anticipated.  I provided 7 minutes of non-face-to-face time during this encounter.   Johnette Abraham, MD

## 2022-06-14 ENCOUNTER — Ambulatory Visit: Payer: Medicare Other | Admitting: Internal Medicine

## 2022-06-17 ENCOUNTER — Telehealth: Payer: Self-pay | Admitting: Internal Medicine

## 2022-06-17 DIAGNOSIS — G8929 Other chronic pain: Secondary | ICD-10-CM

## 2022-06-17 NOTE — Telephone Encounter (Signed)
Patient called in regard to nerve pains.  Nothing is helping. Patient wants to know if provider can send something to help with sleeping, body will not let patient sleep.  Patient wants a call back in regard.

## 2022-06-18 ENCOUNTER — Encounter: Payer: Self-pay | Admitting: Neurology

## 2022-06-18 MED ORDER — AMITRIPTYLINE HCL 25 MG PO TABS: 25.0000 mg | ORAL_TABLET | Freq: Every day | ORAL | 0 refills | Status: DC

## 2022-06-18 MED ORDER — GABAPENTIN 300 MG PO CAPS: ORAL_CAPSULE | ORAL | 3 refills | Status: DC

## 2022-06-18 NOTE — Telephone Encounter (Signed)
I contacted [name] to review his current symptoms.  He states that his neuropathic pain is most severe in the evening.  He has not experienced symptom relief with gabapentin 600 mg 3 times daily or Cymbalta 60 mg daily.  He has been able to schedule an appointment with neurology for 2/7.  In the interim we will increase gabapentin to 600 - 600 - 900 mg 3 times daily.  Discontinue Cymbalta.  Start Elavil 25 mg nightly.  I have also recommended use of capsaicin cream.

## 2022-06-25 ENCOUNTER — Ambulatory Visit (HOSPITAL_COMMUNITY)
Admission: RE | Admit: 2022-06-25 | Discharge: 2022-06-25 | Disposition: A | Discharge status: Home / Self Care | Payer: Medicare Other | Source: Ambulatory Visit | Attending: Internal Medicine | Admitting: Internal Medicine

## 2022-06-25 DIAGNOSIS — Z87891 Personal history of nicotine dependence: Secondary | ICD-10-CM | POA: Diagnosis present

## 2022-06-25 DIAGNOSIS — Z122 Encounter for screening for malignant neoplasm of respiratory organs: Secondary | ICD-10-CM | POA: Diagnosis present

## 2022-06-29 ENCOUNTER — Telehealth: Payer: Self-pay | Admitting: Internal Medicine

## 2022-06-29 NOTE — Telephone Encounter (Signed)
Patient called in meds are not working for him.  gabapentin (NEURONTIN) 300 MG capsule    amitriptyline (ELAVIL) 25 MG tablet   Wants a call back in regard

## 2022-06-30 ENCOUNTER — Encounter: Payer: Self-pay | Admitting: Internal Medicine

## 2022-06-30 NOTE — Telephone Encounter (Signed)
I called [name] to discuss his concerns.  He states that despite recent medication changes his lower extremity pain has not improved.  He is currently taking gabapentin 600 mg qAM, 600 mg lunch, and 900 mg qhs. Elavil 25 mg qhs was recently added. He has also been applying capsaicin creams and utilizing lidocaine patches. There has been no improvement in his pain. I discussed increasing his current medications and we began to discuss additional options for pain relief when he stated that he planned to present to the emergency department. He has follow up scheduled with me this month and has previously been referred to neurology. He is currently scheduled for an appointment on 08/04/22. He reports that he contacted the neurology office recently to see if they were able to see him earlier and was told that no appointments were available.

## 2022-07-01 NOTE — Telephone Encounter (Signed)
Error

## 2022-07-02 ENCOUNTER — Encounter: Payer: Self-pay | Admitting: Neurology

## 2022-07-02 ENCOUNTER — Ambulatory Visit (INDEPENDENT_AMBULATORY_CARE_PROVIDER_SITE_OTHER): Payer: Medicare Other | Admitting: Neurology

## 2022-07-02 VITALS — BP 163/97 | HR 93 | Ht 66.0 in | Wt 162.0 lb

## 2022-07-02 DIAGNOSIS — M48062 Spinal stenosis, lumbar region with neurogenic claudication: Secondary | ICD-10-CM | POA: Diagnosis not present

## 2022-07-02 DIAGNOSIS — M4802 Spinal stenosis, cervical region: Secondary | ICD-10-CM | POA: Diagnosis not present

## 2022-07-02 DIAGNOSIS — R202 Paresthesia of skin: Secondary | ICD-10-CM

## 2022-07-02 DIAGNOSIS — R292 Abnormal reflex: Secondary | ICD-10-CM | POA: Diagnosis not present

## 2022-07-02 NOTE — Patient Instructions (Signed)
We will request records from [facility]  MRI cervical spine without contrast  Nerve testing of the arms  ELECTROMYOGRAM AND NERVE CONDUCTION STUDIES (EMG/NCS) INSTRUCTIONS  How to Prepare The neurologist conducting the EMG will need to know if you have certain medical conditions. Tell the neurologist and other EMG lab personnel if you: Have a pacemaker or any other electrical medical device Take blood-thinning medications Have hemophilia, a blood-clotting disorder that causes prolonged bleeding Bathing Take a shower or bath shortly before your exam in order to remove oils from your skin. Don't apply lotions or creams before the exam.  What to Expect You'll likely be asked to change into a hospital gown for the procedure and lie down on an examination table. The following explanations can help you understand what will happen during the exam.  Electrodes. The neurologist or a technician places surface electrodes at various locations on your skin depending on where you're experiencing symptoms. Or the neurologist may insert needle electrodes at different sites depending on your symptoms.  Sensations. The electrodes will at times transmit a tiny electrical current that you may feel as a twinge or spasm. The needle electrode may cause discomfort or pain that usually ends shortly after the needle is removed. If you are concerned about discomfort or pain, you may want to talk to the neurologist about taking a short break during the exam.  Instructions. During the needle EMG, the neurologist will assess whether there is any spontaneous electrical activity when the muscle is at rest - activity that isn't present in healthy muscle tissue - and the degree of activity when you slightly contract the muscle.  He or she will give you instructions on resting and contracting a muscle at appropriate times. Depending on what muscles and nerves the neurologist is examining, he or she may ask you to change  positions during the exam.  After your EMG You may experience some temporary, minor bruising where the needle electrode was inserted into your muscle. This bruising should fade within several days. If it persists, contact your primary care doctor.
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
## 2022-07-12 ENCOUNTER — Other Ambulatory Visit: Payer: Self-pay | Admitting: Internal Medicine

## 2022-07-12 DIAGNOSIS — G8929 Other chronic pain: Secondary | ICD-10-CM

## 2022-07-16 ENCOUNTER — Ambulatory Visit: Payer: Medicare Other | Attending: Nurse Practitioner | Admitting: Nurse Practitioner

## 2022-07-16 ENCOUNTER — Ambulatory Visit: Payer: Medicare Other | Admitting: Internal Medicine

## 2022-07-16 ENCOUNTER — Encounter: Payer: Self-pay | Admitting: Nurse Practitioner

## 2022-07-16 ENCOUNTER — Other Ambulatory Visit: Payer: Self-pay | Admitting: *Deleted

## 2022-07-16 VITALS — BP 150/80 | HR 78 | Ht 66.0 in | Wt 165.2 lb

## 2022-07-16 DIAGNOSIS — I10 Essential (primary) hypertension: Secondary | ICD-10-CM

## 2022-07-16 DIAGNOSIS — E785 Hyperlipidemia, unspecified: Secondary | ICD-10-CM

## 2022-07-16 DIAGNOSIS — I2510 Atherosclerotic heart disease of native coronary artery without angina pectoris: Secondary | ICD-10-CM | POA: Diagnosis not present

## 2022-07-16 DIAGNOSIS — Z79899 Other long term (current) drug therapy: Secondary | ICD-10-CM

## 2022-07-16 DIAGNOSIS — Z01810 Encounter for preprocedural cardiovascular examination: Secondary | ICD-10-CM | POA: Diagnosis not present

## 2022-07-16 MED ORDER — LOSARTAN POTASSIUM 25 MG PO TABS: 25.0000 mg | ORAL_TABLET | Freq: Every day | ORAL | 1 refills | Status: DC

## 2022-07-16 NOTE — Patient Instructions (Addendum)
Medication Instructions:  Your physician has recommended you make the following change in your medication:  Start losartan 25 mg daily Continue other medications the same  Labwork: BMET in 2 weeks (07/30/2022) [facility] Non-fasting  Testing/Procedures: none  Follow-Up: Your physician recommends that you schedule a follow-up appointment in: 6 months with Dr. Domenic Polite  Any Other Special Instructions Will Be Listed Below (If Applicable). Your physician has requested that you regularly monitor and record your blood pressure readings at home. Please use the same machine at the same time of day to check your readings and record them to bring to your follow-up visit. [place] information below:   If you need a refill on your cardiac medications before your next appointment, please call your pharmacy.
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
## 2022-07-18 ENCOUNTER — Encounter: Payer: Self-pay | Admitting: Nurse Practitioner

## 2022-07-20 ENCOUNTER — Ambulatory Visit (INDEPENDENT_AMBULATORY_CARE_PROVIDER_SITE_OTHER): Payer: Medicare Other | Admitting: Internal Medicine

## 2022-07-20 ENCOUNTER — Encounter: Payer: Self-pay | Admitting: Internal Medicine

## 2022-07-20 VITALS — BP 165/85 | HR 84 | Ht 66.0 in | Wt 162.1 lb

## 2022-07-20 DIAGNOSIS — Z01818 Encounter for other preprocedural examination: Secondary | ICD-10-CM | POA: Insufficient documentation

## 2022-07-20 DIAGNOSIS — I10 Essential (primary) hypertension: Secondary | ICD-10-CM | POA: Diagnosis not present

## 2022-07-20 NOTE — Progress Notes (Unsigned)
   HPI:[name] is a 62 y.o. male who present for a surgical risk evaluation. For the details of today's visit, please refer to the assessment and plan.  Physical Exam: Vitals:   07/20/22 0845  BP: (!) 165/85  Pulse: 84  SpO2: 96%  Weight: 162 lb 1.9 oz (73.5 kg)  Height: '5\' 6"'$  (1.676 m)     Physical Exam Constitutional:      Appearance: He is well-developed and well-groomed.  Eyes:     General: No scleral icterus.    Conjunctiva/sclera: Conjunctivae normal.  Cardiovascular:     Rate and Rhythm: Normal rate and regular rhythm.     Heart sounds: No murmur heard.    No friction rub. No gallop.  Pulmonary:     Effort: Pulmonary effort is normal.     Breath sounds: No wheezing, rhonchi or rales.  Musculoskeletal:     Right lower leg: No edema.     Left lower leg: No edema.      Assessment & Plan:   Encounter for preoperative assessment This individual is at low risk for cardiovascular event during the moderate risk surgery. The revised cardiovascular risk index is 1 and is associated with a 6% percent risk of major cardiovascular events. He has the following risk factor: history of ischemic heart disease.He does have the ability to perform > 4 MET levels of activity and has no active cardiac conditions. He may proceed with surgery with no additional cardiac testing or procedures. He will need to stop Asprin 7 days prior to surgery and Plavix 5 days before surgery. Asprin and Plavix can be restarted the morning after surgery if patient at low risk of bleeding.    Essential hypertension, benign Patients BP is 165/85. He is taking current prescribed medications daily. He is being schedule for lumbar back surgery and is in acute pain today. Blood pressure 110/74 at last visit. I believed elevation is secondary to acute pain and he does not need additional medications at this time. He will continue to monitor at home and if BP is consistently elevated he will make an  appointment.  He will continue current regimen of amlodipine 10 mg daily, Carvedilol 12.5 mg daily , and Losartan 25 mg daily.     Lorene Dy, MD

## 2022-07-20 NOTE — Patient Instructions (Addendum)
Thank you for trusting me with your care. To recap, today we discussed the following:  You have acceptable risk for your surgery and we will fax this form to [facility].  Your blood pressure is elevated today but I believe this is from pain.  Your blood pressure was normal at your last visit with Dr. Doren Custard.  Continue taking losartan 25 mg daily, amlodipine milligrams daily, Coreg 12.5 twice daily.

## 2022-07-21 NOTE — Assessment & Plan Note (Signed)
Patients BP is 165/85. He is taking current prescribed medications daily. He is being schedule for lumbar back surgery and is in acute pain today. Blood pressure 110/74 at last visit. I believed elevation is secondary to acute pain and he does not need additional medications at this time. He will continue to monitor at home and if BP is consistently elevated he will make an appointment.  He will continue current regimen of amlodipine 10 mg daily, Carvedilol 12.5 mg daily , and Losartan 25 mg daily.

## 2022-07-21 NOTE — Assessment & Plan Note (Addendum)
This individual is at low risk for cardiovascular event during the moderate risk surgery. The revised cardiovascular risk index is 1 and is associated with a 6% percent risk of major cardiovascular events. He has the following risk factor: history of ischemic heart disease.He does have the ability to perform > 4 MET levels of activity and has no active cardiac conditions. He may proceed with surgery with no additional cardiac testing or procedures. He will need to stop Asprin 7 days prior to surgery and Plavix 5 days before surgery. Asprin and Plavix can be restarted the morning after surgery if patient at low risk of bleeding.

## 2022-07-21 NOTE — Addendum Note (Signed)
Addended by: Lyndal Pulley on: 07/21/2022 02:51 PM   Modules accepted: Level of Service

## 2022-07-22 ENCOUNTER — Ambulatory Visit (INDEPENDENT_AMBULATORY_CARE_PROVIDER_SITE_OTHER): Payer: Medicare Other | Admitting: Neurology

## 2022-07-22 ENCOUNTER — Ambulatory Visit (HOSPITAL_COMMUNITY): Payer: Self-pay | Admitting: Orthopedic Surgery

## 2022-07-22 DIAGNOSIS — M4802 Spinal stenosis, cervical region: Secondary | ICD-10-CM

## 2022-07-22 DIAGNOSIS — R202 Paresthesia of skin: Secondary | ICD-10-CM

## 2022-07-22 DIAGNOSIS — M48062 Spinal stenosis, lumbar region with neurogenic claudication: Secondary | ICD-10-CM

## 2022-07-22 DIAGNOSIS — G5602 Carpal tunnel syndrome, left upper limb: Secondary | ICD-10-CM

## 2022-07-22 DIAGNOSIS — R292 Abnormal reflex: Secondary | ICD-10-CM

## 2022-07-22 DIAGNOSIS — M5412 Radiculopathy, cervical region: Secondary | ICD-10-CM

## 2022-07-22 NOTE — Procedures (Signed)
[facility]  [place], [address]  [place], [place] [address] Tel: [phone] Fax: [phone] Test Date:  07/22/2022  Patient: [name] DOB: December 24, 1960 Physician: Narda Amber, DO  Sex: Male Height: '5\' 6"'$  Ref Phys: Narda Amber, DO  ID#: [id number]   Technician:    History: This is a 62 year old man referred for evaluation of bilateral arm paresthesias and pain.  NCV & EMG Findings: Extensive electrodiagnostic testing of the right upper extremity and additional studies of the left shows:  Left median sensory response shows prolonged latency (5.8 ms) and reduced amplitude (5.3 V).  Right median, right mixed palmar, and bilateral ulnar sensory responses are within normal limits. Left median motor response shows prolonged latency (5.4 ms).  Left ulnar motor response shows slowed conduction velocity across the elbow (A Elbow-B Elbow, 45 m/s).  Right median and ulnar motor responses are within normal limits.   Chronic motor axon loss changes are seen affecting the C5-6 myotomes bilaterally, without accompanying active denervation.    Impression: Chronic C5-6 radiculopathy affecting bilateral upper extremities, moderate. Left median neuropathy at or distal to the wrist, consistent with a clinical diagnosis of carpal tunnel syndrome.  Overall, these findings are moderate in degree electrically. Left ulnar neuropathy with slowing across the elbow, demyelinating, mild.   ___________________________ Narda Amber, DO    Nerve Conduction Studies   Stim Site NR Peak (ms) Norm Peak (ms) O-P Amp (V) Norm O-P Amp  Left Median Anti Sensory (2nd Digit)  32 C  Wrist    *5.8 <3.8 *5.3 >10  Right Median Anti Sensory (2nd Digit)  32 C  Wrist    3.6 <3.8 24.5 >10  Left Ulnar Anti Sensory (5th Digit)  32 C  Wrist    2.9 <3.2 24.7 >5  Right Ulnar Anti Sensory (5th Digit)  32 C  Wrist    2.7 <3.2 45.4 >5     Stim Site NR Onset (ms) Norm Onset (ms) O-P Amp (mV)  Norm O-P Amp Site1 Site2 Delta-0 (ms) Dist (cm) Vel (m/s) Norm Vel (m/s)  Left Median Motor (Abd Poll Brev)  32 C  Wrist    *5.4 <4.0 6.9 >5 Elbow Wrist 4.8 29.0 60 >50  Elbow    10.2  6.8         Right Median Motor (Abd Poll Brev)  32 C  Wrist    3.4 <4.0 9.9 >5 Elbow Wrist 5.0 28.0 56 >50  Elbow    8.4  9.7         Left Ulnar Motor (Abd Dig Minimi)  32 C  Wrist    2.3 <3.1 7.4 >7 B Elbow Wrist 3.2 21.0 66 >50  B Elbow    5.5  7.4  A Elbow B Elbow 2.2 10.0 *45 >50  A Elbow    7.7  7.2         Right Ulnar Motor (Abd Dig Minimi)  32 C  Wrist    2.3 <3.1 11.4 >7 B Elbow Wrist 3.8 21.0 55 >50  B Elbow    6.1  10.4  A Elbow B Elbow 1.9 10.0 53 >50  A Elbow    8.0  9.9            Stim Site NR Peak (ms) Norm Peak (ms) P-T Amp (V) Site1 Site2 Delta-P (ms) Norm Delta (ms)  Right Median/Ulnar Palm Comparison (Wrist - 8cm)  32 C  Median Palm    2.0 <2.2 64.2 Median Palm  Ulnar Palm 0.3   Ulnar Palm    1.7 <2.2 28.6       Electromyography   Side Muscle Ins.Act Fibs Fasc Recrt Amp Dur Poly Activation Comment  Right 1stDorInt Nml Nml Nml Nml Nml Nml Nml Nml N/A  Right Abd Poll Brev Nml Nml Nml Nml Nml Nml Nml Nml N/A  Right PronatorTeres Nml Nml Nml Nml Nml Nml Nml Nml N/A  Right Biceps Nml Nml Nml *1- *1+ *1+ *1+ Nml N/A  Right Triceps Nml Nml Nml Nml Nml Nml Nml Nml N/A  Right Deltoid Nml Nml Nml *1- *1+ *1+ *1+ Nml N/A  Left 1stDorInt Nml Nml Nml Nml Nml Nml Nml Nml N/A  Left Abd Poll Brev Nml Nml Nml Nml Nml Nml Nml Nml N/A  Left PronatorTeres Nml Nml Nml Nml Nml Nml Nml Nml N/A  Left Biceps Nml Nml Nml *1- *1+ *1+ *1+ Nml N/A  Left Triceps Nml Nml Nml Nml Nml Nml Nml Nml N/A  Left Deltoid Nml Nml Nml *1- *1+ *1+ *1+ Nml N/A      Waveforms:

## 2022-07-22 NOTE — Pre-Procedure Instructions (Signed)
Surgical Instructions    Your procedure is scheduled on Monday, February 5th.  Report to [facility] Main Entrance "A" at 10:00 A.M., then check in with the Admitting office.  Call this number if you have problems the morning of surgery:  [phone]  If you have any questions prior to your surgery date call [phone]: Open Monday-Friday 8am-4pm If you experience any cold or flu symptoms such as cough, fever, chills, shortness of breath, etc. between now and your scheduled surgery, please notify us at the above number.     Remember:  Do not eat after midnight the night before your surgery  You may drink clear liquids until 09:00 AM the morning of your surgery.   Clear liquids allowed are: Water, Non-Citrus Juices (without pulp), Carbonated Beverages, Clear Tea, Black Coffee Only (NO MILK, CREAM OR POWDERED CREAMER of any kind), and Gatorade.    Take these medicines the morning of surgery with A SIP OF WATER  amLODipine (NORVASC)  atorvastatin (LIPITOR)  carvedilol (COREG)  esomeprazole (NEXIUM)  gabapentin (NEURONTIN)    If needed: acetaminophen (TYLENOL)  fluticasone (FLONASE)    Hold Aspirin for 7 days prior to surgery.  Hold Plavix for 5 days prior to surgery.   As of today, STOP taking any Aleve, Naproxen, Ibuprofen, Motrin, Advil, Goody's, BC's, all herbal medications, fish oil, and all vitamins.                     Do NOT Smoke (Tobacco/Vaping) for 24 hours prior to your procedure.  If you use a CPAP at night, you may bring your mask/headgear for your overnight stay.   Contacts, glasses, piercing's, hearing aid's, dentures or partials may not be worn into surgery, please bring cases for these belongings.    For patients admitted to the hospital, discharge time will be determined by your treatment team.   Patients discharged the day of surgery will not be allowed to drive home, and someone needs to stay with them for 24 hours.  SURGICAL WAITING ROOM  VISITATION Patients having surgery or a procedure may have no more than 2 support people in the waiting area - these visitors may rotate.   Children under the age of 9 must have an adult with them who is not the patient. If the patient needs to stay at the hospital during part of their recovery, the visitor guidelines for inpatient rooms apply. Pre-op nurse will coordinate an appropriate time for 1 support person to accompany patient in pre-op.  This support person may not rotate.   Please refer to the [facility] website for the visitor guidelines for Inpatients (after your surgery is over and you are in a regular room).    Special instructions:   [place]- Preparing For Surgery  Before surgery, you can play an important role. Because skin is not sterile, your skin needs to be as free of germs as possible. You can reduce the number of germs on your skin by washing with CHG (chlorahexidine gluconate) Soap before surgery.  CHG is an antiseptic cleaner which kills germs and bonds with the skin to continue killing germs even after washing.    Oral Hygiene is also important to reduce your risk of infection.  Remember - BRUSH YOUR TEETH THE MORNING OF SURGERY WITH YOUR REGULAR TOOTHPASTE  Please do not use if you have an allergy to CHG or antibacterial soaps. If your skin becomes reddened/irritated stop using the CHG.  Do not shave (including legs and  underarms) for at least 48 hours prior to first CHG shower. It is OK to shave your face.  Please follow these instructions carefully.   Shower the NIGHT BEFORE SURGERY and the MORNING OF SURGERY  If you chose to wash your hair, wash your hair first as usual with your normal shampoo.  After you shampoo, rinse your hair and body thoroughly to remove the shampoo.  Use CHG Soap as you would any other liquid soap. You can apply CHG directly to the skin and wash gently with a scrungie or a clean washcloth.   Apply the CHG Soap to your body ONLY  FROM THE NECK DOWN.  Do not use on open wounds or open sores. Avoid contact with your eyes, ears, mouth and genitals (private parts). Wash Face and genitals (private parts)  with your normal soap.   Wash thoroughly, paying special attention to the area where your surgery will be performed.  Thoroughly rinse your body with warm water from the neck down.  DO NOT shower/wash with your normal soap after using and rinsing off the CHG Soap.  Pat yourself dry with a CLEAN TOWEL.  Wear CLEAN PAJAMAS to bed the night before surgery  Place CLEAN SHEETS on your bed the night before your surgery  DO NOT SLEEP WITH PETS.   Day of Surgery: Take a shower with CHG soap. Do not wear jewelry  Do not wear lotions, powders, colognes, or deodorant. Men may shave face and neck. Do not bring valuables to the hospital. [facility] is not responsible for any belongings or valuables.  Wear Clean/Comfortable clothing the morning of surgery Remember to brush your teeth WITH YOUR REGULAR TOOTHPASTE.   Please read over the following fact sheets that you were given.    If you received a COVID test during your pre-op visit  it is requested that you wear a mask when out in public, stay away from anyone that may not be feeling well and notify your surgeon if you develop symptoms. If you have been in contact with anyone that has tested positive in the last 10 days please notify you surgeon.

## 2022-07-23 ENCOUNTER — Encounter (HOSPITAL_COMMUNITY): Payer: Self-pay

## 2022-07-23 ENCOUNTER — Other Ambulatory Visit: Payer: Self-pay

## 2022-07-23 ENCOUNTER — Encounter (HOSPITAL_COMMUNITY)
Admission: RE | Admit: 2022-07-23 | Discharge: 2022-07-23 | Disposition: A | Discharge status: Home / Self Care | Payer: Medicare Other | Source: Ambulatory Visit | Attending: Orthopedic Surgery | Admitting: Orthopedic Surgery

## 2022-07-23 VITALS — BP 139/77 | HR 77 | Temp 97.7°F | Resp 18 | Ht 64.0 in | Wt 162.9 lb

## 2022-07-23 DIAGNOSIS — Z01812 Encounter for preprocedural laboratory examination: Secondary | ICD-10-CM | POA: Diagnosis present

## 2022-07-23 DIAGNOSIS — I10 Essential (primary) hypertension: Secondary | ICD-10-CM | POA: Diagnosis not present

## 2022-07-23 DIAGNOSIS — Z01818 Encounter for other preprocedural examination: Secondary | ICD-10-CM

## 2022-07-23 LAB — CBC
HCT: 36.8 % — ABNORMAL LOW (ref 39.0–52.0)
Hemoglobin: 12.3 g/dL — ABNORMAL LOW (ref 13.0–17.0)
MCH: 35 pg — ABNORMAL HIGH (ref 26.0–34.0)
MCHC: 33.4 g/dL (ref 30.0–36.0)
MCV: 104.8 fL — ABNORMAL HIGH (ref 80.0–100.0)
Platelets: 208 10*3/uL (ref 150–400)
RBC: 3.51 MIL/uL — ABNORMAL LOW (ref 4.22–5.81)
RDW: 13.2 % (ref 11.5–15.5)
WBC: 7.2 10*3/uL (ref 4.0–10.5)
nRBC: 0 % (ref 0.0–0.2)

## 2022-07-23 LAB — BASIC METABOLIC PANEL
Anion gap: 9 (ref 5–15)
BUN: 6 mg/dL — ABNORMAL LOW (ref 8–23)
CO2: 26 mmol/L (ref 22–32)
Calcium: 9.1 mg/dL (ref 8.9–10.3)
Chloride: 99 mmol/L (ref 98–111)
Creatinine, Ser: 0.87 mg/dL (ref 0.61–1.24)
GFR, Estimated: >60 mL/min (ref >60)
Glucose, Bld: 108 mg/dL — ABNORMAL HIGH (ref 70–99)
Potassium: 3.6 mmol/L (ref 3.5–5.1)
Sodium: 134 mmol/L — ABNORMAL LOW (ref 135–145)

## 2022-07-23 LAB — SURGICAL PCR SCREEN
MRSA, PCR: NEGATIVE
Staphylococcus aureus: NEGATIVE

## 2022-07-23 NOTE — Progress Notes (Addendum)
PCP - Dr. Morene Crocker  Cardiologist - Dr. Myles Gip  EP-no  Endocrine-no  Pulm-no  Chest x-ray - na  EKG - 07/16/22  Stress Test - 2015  ECHO - 2015  Cardiac Cath - 2020  AICD-no PM-no LOOP-no  Nerve Stimulator-no  Dialysis-no  Sleep Study-no  LABS-  ASA-  ERAS-  HA1C-na Fasting Blood Sugar - na Checks Blood Sugar ___na__ times a day  Anesthesia- Mr. Chamberlin was given medical clearance by PCP- Dr. Morene Crocker and cardiologist Dr. Myles Gip. Pt denies having chest pain, sob, or fever at this time. All instructions explained to the pt, with a verbal understanding of the material. Pt agrees to go over the instructions while at home for a better understanding. Pt also instructed to self quarantine after being tested for COVID-19. The opportunity to ask questions was provided.

## 2022-07-29 ENCOUNTER — Ambulatory Visit
Admission: RE | Admit: 2022-07-29 | Discharge: 2022-07-29 | Disposition: A | Discharge status: Home / Self Care | Payer: Medicare Other | Source: Ambulatory Visit | Attending: Neurology | Admitting: Neurology

## 2022-07-29 DIAGNOSIS — R292 Abnormal reflex: Secondary | ICD-10-CM

## 2022-07-29 DIAGNOSIS — R202 Paresthesia of skin: Secondary | ICD-10-CM

## 2022-07-29 DIAGNOSIS — M48062 Spinal stenosis, lumbar region with neurogenic claudication: Secondary | ICD-10-CM

## 2022-07-29 DIAGNOSIS — M4802 Spinal stenosis, cervical region: Secondary | ICD-10-CM

## 2022-08-02 ENCOUNTER — Encounter (HOSPITAL_COMMUNITY): Payer: Self-pay | Admitting: Orthopedic Surgery

## 2022-08-02 ENCOUNTER — Observation Stay (HOSPITAL_COMMUNITY)
Admission: RE | Admit: 2022-08-02 | Discharge: 2022-08-03 | Disposition: A | Discharge status: Home / Self Care | Payer: Medicare Other | Source: Ambulatory Visit | Attending: Orthopedic Surgery | Admitting: Orthopedic Surgery

## 2022-08-02 ENCOUNTER — Ambulatory Visit (HOSPITAL_COMMUNITY): Payer: Medicare Other | Admitting: Certified Registered"

## 2022-08-02 ENCOUNTER — Other Ambulatory Visit: Payer: Self-pay

## 2022-08-02 ENCOUNTER — Ambulatory Visit (HOSPITAL_COMMUNITY): Payer: Medicare Other

## 2022-08-02 ENCOUNTER — Encounter (HOSPITAL_COMMUNITY)
Admission: RE | Disposition: A | Discharge status: Home / Self Care | Payer: Self-pay | Source: Ambulatory Visit | Attending: Orthopedic Surgery

## 2022-08-02 DIAGNOSIS — M4802 Spinal stenosis, cervical region: Secondary | ICD-10-CM | POA: Diagnosis present

## 2022-08-02 DIAGNOSIS — I2510 Atherosclerotic heart disease of native coronary artery without angina pectoris: Secondary | ICD-10-CM | POA: Insufficient documentation

## 2022-08-02 DIAGNOSIS — Z79899 Other long term (current) drug therapy: Secondary | ICD-10-CM | POA: Insufficient documentation

## 2022-08-02 DIAGNOSIS — F17210 Nicotine dependence, cigarettes, uncomplicated: Secondary | ICD-10-CM

## 2022-08-02 DIAGNOSIS — F418 Other specified anxiety disorders: Secondary | ICD-10-CM

## 2022-08-02 DIAGNOSIS — M48062 Spinal stenosis, lumbar region with neurogenic claudication: Secondary | ICD-10-CM

## 2022-08-02 DIAGNOSIS — Z7982 Long term (current) use of aspirin: Secondary | ICD-10-CM | POA: Insufficient documentation

## 2022-08-02 DIAGNOSIS — Z7902 Long term (current) use of antithrombotics/antiplatelets: Secondary | ICD-10-CM | POA: Diagnosis not present

## 2022-08-02 DIAGNOSIS — I10 Essential (primary) hypertension: Secondary | ICD-10-CM | POA: Diagnosis not present

## 2022-08-02 DIAGNOSIS — I252 Old myocardial infarction: Secondary | ICD-10-CM

## 2022-08-02 DIAGNOSIS — M4800 Spinal stenosis, site unspecified: Principal | ICD-10-CM | POA: Diagnosis present

## 2022-08-02 DIAGNOSIS — D649 Anemia, unspecified: Secondary | ICD-10-CM

## 2022-08-02 HISTORY — PX: LUMBAR LAMINECTOMY/DECOMPRESSION MICRODISCECTOMY: SHX5026

## 2022-08-02 SURGERY — LUMBAR LAMINECTOMY/DECOMPRESSION MICRODISCECTOMY 1 LEVEL
Anesthesia: General

## 2022-08-02 MED ORDER — DEXAMETHASONE SODIUM PHOSPHATE 10 MG/ML IJ SOLN
INTRAMUSCULAR | Status: AC
  Filled 2022-08-02: qty 1

## 2022-08-02 MED ORDER — ROCURONIUM BROMIDE 10 MG/ML (PF) SYRINGE
PREFILLED_SYRINGE | INTRAVENOUS | Status: DC | PRN
  Administered 2022-08-02: 80 mg via INTRAVENOUS

## 2022-08-02 MED ORDER — TRANEXAMIC ACID-NACL 1000-0.7 MG/100ML-% IV SOLN
INTRAVENOUS | Status: DC | PRN
  Administered 2022-08-02: 1000 mg via INTRAVENOUS

## 2022-08-02 MED ORDER — ROCURONIUM BROMIDE 10 MG/ML (PF) SYRINGE
PREFILLED_SYRINGE | INTRAVENOUS | Status: AC
  Filled 2022-08-02: qty 10

## 2022-08-02 MED ORDER — PROPOFOL 10 MG/ML IV BOLUS
INTRAVENOUS | Status: DC | PRN
  Administered 2022-08-02: 180 mg via INTRAVENOUS

## 2022-08-02 MED ORDER — ACETAMINOPHEN 500 MG PO TABS: 1000.0000 mg | ORAL_TABLET | Freq: Once | ORAL | Status: DC | PRN

## 2022-08-02 MED ORDER — METHOCARBAMOL 500 MG PO TABS
ORAL_TABLET | ORAL | Status: AC
  Filled 2022-08-02: qty 1

## 2022-08-02 MED ORDER — SODIUM CHLORIDE 0.9% FLUSH: 3.0000 mL | INTRAVENOUS | Status: DC | PRN

## 2022-08-02 MED ORDER — HYDROMORPHONE HCL 1 MG/ML IJ SOLN
1.0000 mg | INTRAMUSCULAR | Status: DC | PRN
  Administered 2022-08-02: 1 mg via INTRAVENOUS
  Filled 2022-08-02: qty 1

## 2022-08-02 MED ORDER — ONDANSETRON HCL 4 MG/2ML IJ SOLN
INTRAMUSCULAR | Status: DC | PRN
  Administered 2022-08-02: 4 mg via INTRAVENOUS

## 2022-08-02 MED ORDER — GABAPENTIN 300 MG PO CAPS: 300.0000 mg | ORAL_CAPSULE | Freq: Three times a day (TID) | ORAL | 0 refills | Status: DC | PRN

## 2022-08-02 MED ORDER — METHOCARBAMOL 500 MG PO TABS
500.0000 mg | ORAL_TABLET | Freq: Four times a day (QID) | ORAL | Status: DC | PRN
  Administered 2022-08-02 – 2022-08-03 (×3): 500 mg via ORAL
  Filled 2022-08-02 (×2): qty 1

## 2022-08-02 MED ORDER — CHLORHEXIDINE GLUCONATE 0.12 % MT SOLN: 15.0000 mL | OROMUCOSAL | Status: AC

## 2022-08-02 MED ORDER — PHENOL 1.4 % MT LIQD: 1.0000 | OROMUCOSAL | Status: DC | PRN

## 2022-08-02 MED ORDER — FENTANYL CITRATE (PF) 250 MCG/5ML IJ SOLN
INTRAMUSCULAR | Status: AC
  Filled 2022-08-02: qty 5

## 2022-08-02 MED ORDER — PROPOFOL 10 MG/ML IV BOLUS
INTRAVENOUS | Status: AC
  Filled 2022-08-02: qty 20

## 2022-08-02 MED ORDER — THROMBIN 20000 UNITS EX SOLR
CUTANEOUS | Status: DC | PRN
  Administered 2022-08-02: 20 mL via TOPICAL

## 2022-08-02 MED ORDER — SODIUM CHLORIDE 0.9% FLUSH: 3.0000 mL | Freq: Two times a day (BID) | INTRAVENOUS | Status: DC

## 2022-08-02 MED ORDER — FENTANYL CITRATE (PF) 250 MCG/5ML IJ SOLN
INTRAMUSCULAR | Status: DC | PRN
  Administered 2022-08-02 (×3): 50 ug via INTRAVENOUS
  Administered 2022-08-02: 100 ug via INTRAVENOUS

## 2022-08-02 MED ORDER — BUPIVACAINE-EPINEPHRINE 0.5% -1:200000 IJ SOLN
INTRAMUSCULAR | Status: DC | PRN
  Administered 2022-08-02: 10 mL

## 2022-08-02 MED ORDER — SUGAMMADEX SODIUM 200 MG/2ML IV SOLN
INTRAVENOUS | Status: DC | PRN
  Administered 2022-08-02: 200 mg via INTRAVENOUS

## 2022-08-02 MED ORDER — CHLORHEXIDINE GLUCONATE 0.12 % MT SOLN
OROMUCOSAL | Status: AC
  Administered 2022-08-02: 15 mL via OROMUCOSAL
  Filled 2022-08-02: qty 15

## 2022-08-02 MED ORDER — METHOCARBAMOL 1000 MG/10ML IJ SOLN
500.0000 mg | Freq: Four times a day (QID) | INTRAVENOUS | Status: DC | PRN
  Filled 2022-08-02: qty 5

## 2022-08-02 MED ORDER — SODIUM CHLORIDE 0.9 % IV SOLN: 250.0000 mL | INTRAVENOUS | Status: DC

## 2022-08-02 MED ORDER — THROMBIN 20000 UNITS EX SOLR
CUTANEOUS | Status: AC
  Filled 2022-08-02: qty 20000

## 2022-08-02 MED ORDER — OXYCODONE HCL 5 MG PO TABS
ORAL_TABLET | ORAL | Status: AC
  Filled 2022-08-02: qty 1

## 2022-08-02 MED ORDER — METHOCARBAMOL 500 MG PO TABS: 500.0000 mg | ORAL_TABLET | Freq: Three times a day (TID) | ORAL | 0 refills | Status: AC | PRN

## 2022-08-02 MED ORDER — OXYCODONE HCL 5 MG PO TABS: 5.0000 mg | ORAL_TABLET | ORAL | Status: DC | PRN

## 2022-08-02 MED ORDER — LIDOCAINE 2% (20 MG/ML) 5 ML SYRINGE
INTRAMUSCULAR | Status: AC
  Filled 2022-08-02: qty 5

## 2022-08-02 MED ORDER — FENTANYL CITRATE (PF) 100 MCG/2ML IJ SOLN
INTRAMUSCULAR | Status: AC
  Filled 2022-08-02: qty 2

## 2022-08-02 MED ORDER — PHENYLEPHRINE 80 MCG/ML (10ML) SYRINGE FOR IV PUSH (FOR BLOOD PRESSURE SUPPORT)
PREFILLED_SYRINGE | INTRAVENOUS | Status: DC | PRN
  Administered 2022-08-02 (×2): 80 ug via INTRAVENOUS

## 2022-08-02 MED ORDER — AMLODIPINE BESYLATE 10 MG PO TABS
10.0000 mg | ORAL_TABLET | Freq: Every day | ORAL | Status: DC
  Filled 2022-08-02: qty 1

## 2022-08-02 MED ORDER — CEFAZOLIN SODIUM-DEXTROSE 2-4 GM/100ML-% IV SOLN
INTRAVENOUS | Status: AC
  Filled 2022-08-02: qty 100

## 2022-08-02 MED ORDER — SURGIFLO WITH THROMBIN (HEMOSTATIC MATRIX KIT) OPTIME
TOPICAL | Status: DC | PRN
  Administered 2022-08-02: 1 via TOPICAL

## 2022-08-02 MED ORDER — OXYCODONE HCL 5 MG/5ML PO SOLN: 5.0000 mg | Freq: Once | ORAL | Status: AC | PRN

## 2022-08-02 MED ORDER — DEXAMETHASONE SODIUM PHOSPHATE 10 MG/ML IJ SOLN
INTRAMUSCULAR | Status: DC | PRN
  Administered 2022-08-02: 10 mg via INTRAVENOUS

## 2022-08-02 MED ORDER — LACTATED RINGERS IV SOLN: INTRAVENOUS | Status: DC

## 2022-08-02 MED ORDER — ACETAMINOPHEN 325 MG PO TABS
650.0000 mg | ORAL_TABLET | ORAL | Status: DC | PRN
  Administered 2022-08-02 – 2022-08-03 (×5): 650 mg via ORAL
  Filled 2022-08-02 (×5): qty 2

## 2022-08-02 MED ORDER — OXYCODONE HCL 5 MG PO TABS
10.0000 mg | ORAL_TABLET | ORAL | Status: DC | PRN
  Administered 2022-08-02 – 2022-08-03 (×6): 10 mg via ORAL
  Filled 2022-08-02 (×6): qty 2

## 2022-08-02 MED ORDER — OXYCODONE HCL 5 MG PO TABS
5.0000 mg | ORAL_TABLET | Freq: Once | ORAL | Status: AC | PRN
  Administered 2022-08-02: 5 mg via ORAL

## 2022-08-02 MED ORDER — LIDOCAINE 2% (20 MG/ML) 5 ML SYRINGE
INTRAMUSCULAR | Status: DC | PRN
  Administered 2022-08-02: 60 mg via INTRAVENOUS

## 2022-08-02 MED ORDER — OXYCODONE-ACETAMINOPHEN 10-325 MG PO TABS: 1.0000 | ORAL_TABLET | Freq: Four times a day (QID) | ORAL | 0 refills | Status: AC | PRN

## 2022-08-02 MED ORDER — POLYETHYLENE GLYCOL 3350 17 G PO PACK: 17.0000 g | PACK | Freq: Every day | ORAL | Status: DC | PRN

## 2022-08-02 MED ORDER — ACETAMINOPHEN 10 MG/ML IV SOLN
INTRAVENOUS | Status: AC
  Filled 2022-08-02: qty 100

## 2022-08-02 MED ORDER — ONDANSETRON HCL 4 MG/2ML IJ SOLN: 4.0000 mg | Freq: Four times a day (QID) | INTRAMUSCULAR | Status: DC | PRN

## 2022-08-02 MED ORDER — CEFAZOLIN SODIUM-DEXTROSE 1-4 GM/50ML-% IV SOLN
1.0000 g | Freq: Three times a day (TID) | INTRAVENOUS | Status: AC
  Administered 2022-08-02 (×2): 1 g via INTRAVENOUS
  Filled 2022-08-02 (×2): qty 50

## 2022-08-02 MED ORDER — TRANEXAMIC ACID-NACL 1000-0.7 MG/100ML-% IV SOLN
INTRAVENOUS | Status: AC
  Filled 2022-08-02: qty 100

## 2022-08-02 MED ORDER — FENTANYL CITRATE (PF) 100 MCG/2ML IJ SOLN
25.0000 ug | INTRAMUSCULAR | Status: DC | PRN
  Administered 2022-08-02 (×2): 50 ug via INTRAVENOUS

## 2022-08-02 MED ORDER — ACETAMINOPHEN 10 MG/ML IV SOLN
1000.0000 mg | Freq: Once | INTRAVENOUS | Status: DC | PRN
  Administered 2022-08-02: 1000 mg via INTRAVENOUS

## 2022-08-02 MED ORDER — ATORVASTATIN CALCIUM 40 MG PO TABS
40.0000 mg | ORAL_TABLET | Freq: Every day | ORAL | Status: DC
  Administered 2022-08-03: 40 mg via ORAL
  Filled 2022-08-02: qty 1

## 2022-08-02 MED ORDER — LOSARTAN POTASSIUM 25 MG PO TABS
25.0000 mg | ORAL_TABLET | Freq: Every day | ORAL | Status: DC
  Filled 2022-08-02 (×2): qty 1

## 2022-08-02 MED ORDER — 0.9 % SODIUM CHLORIDE (POUR BTL) OPTIME
TOPICAL | Status: DC | PRN
  Administered 2022-08-02: 1000 mL

## 2022-08-02 MED ORDER — CEFAZOLIN SODIUM-DEXTROSE 2-4 GM/100ML-% IV SOLN
2.0000 g | INTRAVENOUS | Status: AC
  Administered 2022-08-02: 2 g via INTRAVENOUS

## 2022-08-02 MED ORDER — MENTHOL 3 MG MT LOZG: 1.0000 | LOZENGE | OROMUCOSAL | Status: DC | PRN

## 2022-08-02 MED ORDER — BUPIVACAINE-EPINEPHRINE (PF) 0.5% -1:200000 IJ SOLN
INTRAMUSCULAR | Status: AC
  Filled 2022-08-02: qty 30

## 2022-08-02 MED ORDER — MAGNESIUM CITRATE PO SOLN: 1.0000 | Freq: Once | ORAL | Status: DC | PRN

## 2022-08-02 MED ORDER — ONDANSETRON HCL 4 MG/2ML IJ SOLN
INTRAMUSCULAR | Status: AC
  Filled 2022-08-02: qty 2

## 2022-08-02 MED ORDER — GABAPENTIN 300 MG PO CAPS
300.0000 mg | ORAL_CAPSULE | Freq: Three times a day (TID) | ORAL | Status: DC
  Administered 2022-08-02 – 2022-08-03 (×3): 300 mg via ORAL
  Filled 2022-08-02 (×3): qty 1

## 2022-08-02 MED ORDER — ONDANSETRON HCL 4 MG PO TABS: 4.0000 mg | ORAL_TABLET | Freq: Four times a day (QID) | ORAL | Status: DC | PRN

## 2022-08-02 MED ORDER — CARVEDILOL 12.5 MG PO TABS
12.5000 mg | ORAL_TABLET | Freq: Two times a day (BID) | ORAL | Status: DC
  Administered 2022-08-02 – 2022-08-03 (×2): 12.5 mg via ORAL
  Filled 2022-08-02 (×2): qty 1

## 2022-08-02 MED ORDER — ONDANSETRON HCL 4 MG PO TABS: 4.0000 mg | ORAL_TABLET | Freq: Three times a day (TID) | ORAL | 0 refills | Status: DC | PRN

## 2022-08-02 MED ORDER — EPHEDRINE SULFATE-NACL 50-0.9 MG/10ML-% IV SOSY
PREFILLED_SYRINGE | INTRAVENOUS | Status: DC | PRN
  Administered 2022-08-02 (×3): 5 mg via INTRAVENOUS

## 2022-08-02 MED ORDER — ACETAMINOPHEN 160 MG/5ML PO SOLN: 1000.0000 mg | Freq: Once | ORAL | Status: DC | PRN

## 2022-08-02 MED ORDER — ACETAMINOPHEN 650 MG RE SUPP: 650.0000 mg | RECTAL | Status: DC | PRN

## 2022-08-02 SURGICAL SUPPLY — 54 items
BAG COUNTER SPONGE SURGICOUNT (BAG) ×1 IMPLANT
BNDG GAUZE DERMACEA FLUFF 4 (GAUZE/BANDAGES/DRESSINGS) ×1 IMPLANT
CANISTER SUCT 3000ML PPV (MISCELLANEOUS) ×1 IMPLANT
CLSR STERI-STRIP ANTIMIC 1/2X4 (GAUZE/BANDAGES/DRESSINGS) ×1 IMPLANT
CORD BIPOLAR FORCEPS 12FT (ELECTRODE) ×1 IMPLANT
COVER SURGICAL LIGHT HANDLE (MISCELLANEOUS) ×1 IMPLANT
DRAIN CHANNEL 15F RND FF W/TCR (WOUND CARE) IMPLANT
DRAPE POUCH INSTRU U-SHP 10X18 (DRAPES) IMPLANT
DRAPE SURG 17X23 STRL (DRAPES) ×1 IMPLANT
DRAPE U-SHAPE 47X51 STRL (DRAPES) ×1 IMPLANT
DRSG OPSITE POSTOP 3X4 (GAUZE/BANDAGES/DRESSINGS) ×1 IMPLANT
DRSG OPSITE POSTOP 4X6 (GAUZE/BANDAGES/DRESSINGS) IMPLANT
DURAPREP 26ML APPLICATOR (WOUND CARE) ×1 IMPLANT
ELECT BLADE 4.0 EZ CLEAN MEGAD (MISCELLANEOUS)
ELECT CAUTERY BLADE 6.4 (BLADE) ×1 IMPLANT
ELECT PENCIL ROCKER SW 15FT (MISCELLANEOUS) ×1 IMPLANT
ELECT REM PT RETURN 9FT ADLT (ELECTROSURGICAL) ×1
ELECTRODE BLDE 4.0 EZ CLN MEGD (MISCELLANEOUS) IMPLANT
ELECTRODE REM PT RTRN 9FT ADLT (ELECTROSURGICAL) ×1 IMPLANT
EVACUATOR SILICONE 100CC (DRAIN) IMPLANT
GLOVE BIO SURGEON STRL SZ 6.5 (GLOVE) ×1 IMPLANT
GLOVE BIOGEL PI IND STRL 6.5 (GLOVE) ×1 IMPLANT
GLOVE BIOGEL PI IND STRL 8.5 (GLOVE) ×1 IMPLANT
GLOVE SS BIOGEL STRL SZ 8.5 (GLOVE) ×1 IMPLANT
GOWN STRL REUS W/ TWL LRG LVL3 (GOWN DISPOSABLE) ×2 IMPLANT
GOWN STRL REUS W/TWL 2XL LVL3 (GOWN DISPOSABLE) ×1 IMPLANT
GOWN STRL REUS W/TWL LRG LVL3 (GOWN DISPOSABLE) ×2
KIT BASIN OR (CUSTOM PROCEDURE TRAY) ×1 IMPLANT
KIT TURNOVER KIT B (KITS) ×1 IMPLANT
NDL 22X1.5 STRL (OR ONLY) (MISCELLANEOUS) ×1 IMPLANT
NDL SPNL 18GX3.5 QUINCKE PK (NEEDLE) ×2 IMPLANT
NEEDLE 22X1.5 STRL (OR ONLY) (MISCELLANEOUS) ×1 IMPLANT
NEEDLE SPNL 18GX3.5 QUINCKE PK (NEEDLE) ×2 IMPLANT
NS IRRIG 1000ML POUR BTL (IV SOLUTION) ×1 IMPLANT
PACK LAMINECTOMY ORTHO (CUSTOM PROCEDURE TRAY) ×1 IMPLANT
PACK UNIVERSAL I (CUSTOM PROCEDURE TRAY) ×1 IMPLANT
PAD ARMBOARD 7.5X6 YLW CONV (MISCELLANEOUS) ×2 IMPLANT
PATTIES SURGICAL .5 X.5 (GAUZE/BANDAGES/DRESSINGS) ×1 IMPLANT
PATTIES SURGICAL .5 X1 (DISPOSABLE) ×1 IMPLANT
SPONGE SURGIFOAM ABS GEL 100 (HEMOSTASIS) IMPLANT
SPONGE T-LAP 4X18 ~~LOC~~+RFID (SPONGE) ×3 IMPLANT
SURGIFLO W/THROMBIN 8M KIT (HEMOSTASIS) IMPLANT
SUT BONE WAX W31G (SUTURE) ×1 IMPLANT
SUT MNCRL+ AB 3-0 CT1 36 (SUTURE) ×1 IMPLANT
SUT MONOCRYL AB 3-0 CT1 36IN (SUTURE) ×1
SUT VIC AB 1 CT1 18XCR BRD 8 (SUTURE) ×1 IMPLANT
SUT VIC AB 1 CT1 8-18 (SUTURE) ×1
SUT VIC AB 2-0 CT1 18 (SUTURE) ×1 IMPLANT
SYR BULB IRRIG 60ML STRL (SYRINGE) ×1 IMPLANT
SYR CONTROL 10ML LL (SYRINGE) ×1 IMPLANT
TOWEL GREEN STERILE (TOWEL DISPOSABLE) ×1 IMPLANT
TOWEL GREEN STERILE FF (TOWEL DISPOSABLE) ×1 IMPLANT
WATER STERILE IRR 1000ML POUR (IV SOLUTION) ×1 IMPLANT
YANKAUER SUCT BULB TIP NO VENT (SUCTIONS) IMPLANT

## 2022-08-02 NOTE — Op Note (Signed)
OPERATIVE REPORT  DATE OF SURGERY: 08/02/2022  PATIENT NAME:  [name] MRN: [id number] DOB: 1961-02-21  PCP: Johnette Abraham, MD  PRE-OPERATIVE DIAGNOSIS: Lumbar spinal stenosis with neurogenic claudication L4-5  POST-OPERATIVE DIAGNOSIS: Same  PROCEDURE:   Lumbar decompression L4-5.  Including medial facetectomy and foraminotomy  SURGEON:  Melina Schools, MD  PHYSICIAN ASSISTANT: None  ANESTHESIA:   General  EBL: 20 ml   Complications: None  BRIEF HISTORY: [name] is a 62 y.o. male who presented to my office with complaints of back buttock and neuropathic leg pain.  Attempted conservative management had failed to alleviate his pain and so we elected to move forward with surgery.  All appropriate risks, benefits, and alternatives to surgery were discussed with the patient and consent was obtained.  PROCEDURE DETAILS: Patient was brought into the operating room and was properly positioned on the operating room table.  After induction with general anesthesia the patient was endotracheally intubated.  A timeout was taken to confirm all important data: including patient, procedure, and the level. Teds, SCD's were applied.   Patient was turned prone onto the Wilson frame and all bony prominences well-padded.  The back was prepped and draped in a standard fashion.  2 needles were placed into the back and an x-ray was taken for localization of our incision site.  I marked out the L4-5 level and infiltrated the midline incision with 1/2 percent Marcaine with epinephrine.  Midline incision was made and sharp dissection was carried out down to the fascia.  The deep fascia was sharply incised and I stripped the paraspinal muscles to expose the L4 and L5 spinous process as well as the L4 lamina.  A Penfield 4 was placed underneath the L4 lamina and a second x-ray was taken for localization.  The marker was at the L4 lamina and this was confirmed by the radiologist.    The caudal  half of the spinous process of L4 was removed with a double-action Leksell rongeur and the bulk of the lamina of L4 was removed with the same rongeurs.  I then used a 3 mm Kerrison rongeur to complete the laminotomy bilaterally.  There was significant thickening of the ligamentum flavum.  This was also removed with the Leksell rongeur.  I then gently dissected through the central raphae of the ligamentum flavum to expose the thecal sac.  I then dissected to create the plane between the ligamentum flavum and the thecal sac.  A neuro patty was then placed to delineate this plane and then I used my Kerrison rongeurs to remove the central portion of the ligamentum flavum.  I then trimmed the leading edge of the L5 spinous process to complete the central decompression.  Using my Penfield 4 I dissected into the lateral recess and used the Kerrison rongeur to resect the ligamentum flavum and perform a medial facetectomy in order to completely decompress the lateral recess.  I was able to carry my dissection superiorly to about the level of the pars.  I was able to palpate the inferior aspect of the L4 pedicle.  I was also able to use my nerve hook to palpate out the L4 foramen confirming adequate decompression.  I continued inferiorly until I could see the medial border of the L5 pedicle.  I then identified the L5 nerve root protected it and then continued into the L5 foramen with my Kerrison rongeur completing the foraminal decompression.  At this point I had a complete lateral recess  decompression on the side.  Medial facetectomy was done in order to decompress out to the medial border of the pedicle.  Both the L4 and L5 foramen were also decompressed.  I could now easily run my nerve hook from the L4 pedicle down to the L5 pedicle and out each of the respective foramen.  I could also pass it medially underneath the thecal sac at the level of the disc space confirming satisfactory decompression.  I then went to the  contralateral side of the table and using the same technique that I just employed performed a lateral recess decompression on the contralateral side.  I again did a medial facetectomy as well as an L5 laminotomy.  I identified the L5 nerve root retracted into the foramen and confirmed it was adequately decompressed.  I then went superiorly until I could palpate the inferior aspect of the L4 pedicle and out into the L4 foramen.  At this point had an adequate overall decompression.  The thecal sac was no longer under compression and the exiting nerve roots were all decompressed.  I irrigated the wound copiously normal saline and confirmed hemostasis.  The large epidural veins which were now visible were isolated and coagulated with bipolar electrocautery.  After final irrigation a thrombin-soaked Gelfoam patty was placed over the laminotomy site and the retractors were removed.  The deep fascia was closed with interrupted #1 Vicryl suture followed by interrupted 2-0 Vicryl sutures.  3-0 Monocryl was then used for the skin.  Steri-Strips and a dry dressing were applied and the patient was ultimately extubated and transferred to PACU without incident.  The end of the case all needle and sponge counts were correct.  There were no adverse intraoperative events.   Melina Schools, MD 08/02/2022 3:06 PM
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
## 2022-08-02 NOTE — Discharge Instructions (Addendum)
Laminectomy, Care After This sheet gives you information about how to care for yourself after your procedure. Your health care provider may also give you more specific instructions. If you have problems or questions, contact your health care provider. What can I expect after the procedure? After the procedure, it is common to have: Some pain around your incision area. Muscle tightening (spasms) across the back.   Follow these instructions at home: Incision care Follow instructions from your health care provider about how to take care of your incision area. Make sure you: Wash your hands with soap and water before and after you apply medicine to the area or change your bandage (dressing). If soap and water are not available, use hand sanitizer. Change your dressing as told by your health care provider. Leave stitches (sutures), skin glue, or adhesive strips in place. These skin closures may need to stay in place for 2 weeks or longer. If adhesive strip edges start to loosen and curl up, you may trim the loose edges. Do not remove adhesive strips completely unless your health care provider tells you to do that.  Check your incision area every day for signs of infection. Check for: More redness, swelling, or pain. More fluid or blood. Warmth. Pus or a bad smell. Medicines Take over-the-counter and prescription medicines only as told by your health care provider. If you were prescribed an antibiotic medicine, use it as told by your health care provider. Do not stop using the antibiotic even if you start to feel better. If needed, call office in 3 days to request refill of pain medications. Bathing Do not take baths, swim, or use a hot tub for 6 weeks, or until your incision has healed completely. If your health care provider approves, you may take showers after your dressing has been removed. Ok to shower in 5 days Activity Return to your normal activities as told by your health care  provider. Ask your health care provider what activities are safe for you. Avoid bending or twisting at your waist. Always bend at your knees. Do not sit for more than 20-30 minutes at a time. Lie down or walk between periods of sitting. Do not lift anything that is heavier than 10 lb (4.5 kg) or the limit that your health care provider tells you, until he or she says that it is safe. Do not drive for 2 weeks after your procedure or for as long as your health care provider tells you.  Do not drive or use heavy machinery while taking prescription pain medicine. General instructions To prevent or treat constipation while you are taking prescription pain medicine, your health care provider may recommend that you: Drink enough fluid to keep your urine clear or pale yellow. Take over-the-counter or prescription medicines. Eat foods that are high in fiber, such as fresh fruits and vegetables, whole grains, and beans. Limit foods that are high in fat and processed sugars, such as fried and sweet foods. Do breathing exercises as told. Keep all follow-up visits as told by your health care provider. This is important. Contact a health care provider if: You have more redness, swelling, or pain around your incision area. Your incision feels warm to the touch. You are not able to return to activities or do exercises as told by your health care provider. Get help right away if: You have: More fluid or blood coming from your incision area. Pus or a bad smell coming from your incision area. Chills or a fever.   Episodes of dizziness or fainting while standing. You develop a rash. You develop shortness of breath or you have difficulty breathing. You cannot control when you urinate or have a bowel movement. You become weak. You are not able to use your legs. Summary After the procedure, it is common to have some pain around your incision area. You may also have muscle tightening (spasms) across the  back. Follow instructions from your health care provider about how to care for your incision. Do not lift anything that is heavier than 10 lb (4.5 kg) or the limit that your health care provider tells you, until he or she says that it is safe. Contact your health care provider if you have more redness, swelling, or pain around your incision area or if your incision feels warm to the touch. These can be signs of infection. This information is not intended to replace advice given to you by your health care provider. Make sure you discuss any questions you have with your health care provider. Refer to this sheet in the next few weeks. These instructions provide you with information about caring for yourself after your procedure. Your health care provider may also give you more specific instructions. Your treatment has been planned according to current medical practices, but problems sometimes occur. Call your health care provider if you have any problems or questions after your procedure. What can I expect after the procedure? It is common to have pain for the first few days after the procedure. Some people continue to have mild pain even after making a full recovery. Follow these instructions at home: Medicine Take medicines only as directed by your health care provider. Avoid taking over-the-counter pain medicines unless your health care provider tells you otherwise. These medicines interfere with the development and growth of new bone cells. If you were prescribed a narcotic pain medicine, take it exactly as told by your health care provider. Do not drink alcohol while on the medicine. Do not drive while on the medicine. Injury care Care for your back brace as told by your health care provider. If directed, apply ice to the injured area: Put ice in a plastic bag. Place a towel between your skin and the bag. Leave the ice on for 20 minutes, 2-3 times a day. Activity Perform physical therapy  exercises as told by your health care provider. Exercise regularly. Start by taking short walks. Slowly increase your activity level over time. Gentle exercise helps to ease pain. Sit, stand, walk, turn in bed, and reposition yourself as told by your health care provider. This will help to keep your spine in proper alignment. Avoid bending and twisting your body. Avoid doing strenuous household chores, such as vacuuming. Do not lift anything that is heavier than 10 lb (4.5 kg). Other Instructions Keep all follow-up visits as directed by your health care provider. This is important. Do not use any tobacco products, including cigarettes, chewing tobacco, or electronic cigarettes. If you need help quitting, ask your health care provider. Nicotine affects the way bones heal. Contact a health care provider if: Your pain gets worse. You have a fever. You have redness, swelling, or pain at the site of your incision. You have fluid, blood, or pus coming from your incision. You have numbness, tingling, or weakness in any part of your body. Get help right away if: Your incision feels swollen and tender, and the surrounding area looks like a lump. The lump may be red or bluish in color. You   cannot move any part of your body (paralysis). You cannot control your bladder or bowels.    RESTART PLAVIX AND ASA ON THURSDAY MORNING 08/05/22

## 2022-08-02 NOTE — Anesthesia Procedure Notes (Signed)
Procedure Name: Intubation Date/Time: 08/02/2022 1:20 PM  Performed by: Annamary Carolin, CRNAPre-anesthesia Checklist: Patient identified, Emergency Drugs available, Suction available and Patient being monitored Patient Re-evaluated:Patient Re-evaluated prior to induction Oxygen Delivery Method: Circle System Utilized Preoxygenation: Pre-oxygenation with 100% oxygen Induction Type: IV induction Ventilation: Mask ventilation without difficulty Laryngoscope Size: Mac and 3 Grade View: Grade I Tube type: Oral Tube size: 7.5 mm Number of attempts: 1 Airway Equipment and Method: Stylet and Oral airway Placement Confirmation: ETT inserted through vocal cords under direct vision, positive ETCO2 and breath sounds checked- equal and bilateral Tube secured with: Tape Dental Injury: Teeth and Oropharynx as per pre-operative assessment  Comments: With ease.

## 2022-08-02 NOTE — Anesthesia Postprocedure Evaluation (Signed)
Anesthesia Post Note  Patient: [name]  Procedure(s) Performed: LUMBAR FOUR - FIVE Decompression     Patient location during evaluation: PACU Anesthesia Type: General Level of consciousness: awake and alert Pain management: pain level controlled Vital Signs Assessment: post-procedure vital signs reviewed and stable Respiratory status: spontaneous breathing, nonlabored ventilation, respiratory function stable and patient connected to nasal cannula oxygen Cardiovascular status: blood pressure returned to baseline and stable Postop Assessment: no apparent nausea or vomiting Anesthetic complications: no   No notable events documented.  Last Vitals:  Vitals:   08/02/22 1605 08/02/22 1934  BP: (!) 135/91 136/89  Pulse: 80 77  Resp: 18 17  Temp: 36.4 C 36.5 C  SpO2: 97% 99%    Last Pain:  Vitals:   08/02/22 1956  TempSrc:   PainSc: 10-Worst pain ever                 Jon Lall

## 2022-08-02 NOTE — Transfer of Care (Signed)
Immediate Anesthesia Transfer of Care Note  Patient: [name]  Procedure(s) Performed: LUMBAR FOUR - FIVE Decompression  Patient Location: PACU  Anesthesia Type:General  Level of Consciousness: awake, alert , oriented, and patient cooperative  Airway & Oxygen Therapy: Patient Spontanous Breathing and Patient connected to nasal cannula oxygen  Post-op Assessment: Report given to RN, Post -op Vital signs reviewed and stable, and Patient moving all extremities X 4  Post vital signs: Reviewed and stable  Last Vitals:  Vitals Value Taken Time  BP 147/80 08/02/22 1515  Temp    Pulse 87 08/02/22 1516  Resp 15 08/02/22 1516  SpO2 97 % 08/02/22 1516  Vitals shown include unvalidated device data.  Last Pain:  Vitals:   08/02/22 1018  TempSrc:   PainSc: 10-Worst pain ever      Patients Stated Pain Goal: 6 (45/91/36 8599)  Complications: No notable events documented.

## 2022-08-02 NOTE — OR Nursing (Signed)
Dr. Maree Erie called at 1402 on 08-02-2022 and confirmed localization of Lumbar 4 - Lumbar 5.

## 2022-08-02 NOTE — Anesthesia Preprocedure Evaluation (Signed)
Anesthesia Evaluation  Patient identified by MRN, date of birth, ID band Patient awake    Reviewed: Allergy & Precautions, NPO status , Patient's Chart, lab work & pertinent test results  Airway Mallampati: II  TM Distance: >3 FB Neck ROM: Full    Dental  (+) Teeth Intact, Dental Advisory Given,    Pulmonary neg shortness of breath, neg sleep apnea, neg COPD, neg recent URI, Current Smoker and Patient abstained from smoking.   breath sounds clear to auscultation       Cardiovascular hypertension, (-) angina + CAD, + Past MI, + Cardiac Stents and + Peripheral Vascular Disease   Rhythm:Regular  Coronary artery disease      a. DES to RCA 2003 b. DES x 2 RCA 2012 with acute MI (cardiogenic shock and VF) c. DES proximal LAD and PTCA diagonal 2015 d. DES to RCA, DES to LAD, and DES to PDA in 06/2018 ( at outside hospital in [place])     Neuro/Psych  [place] Depression Bipolar Disorder    Neuromuscular disease    GI/Hepatic Neg liver ROS,,,  Endo/Other  negative endocrine ROS    Renal/GU negative Renal ROS     Musculoskeletal negative musculoskeletal ROS (+)    Abdominal   Peds  Hematology  (+) Blood dyscrasia, anemia Lab Results      Component                Value               Date                      WBC                      7.2                 07/23/2022                HGB                      12.3 (L)            07/23/2022                HCT                      36.8 (L)            07/23/2022                MCV                      104.8 (H)           07/23/2022                PLT                      208                 07/23/2022              Anesthesia Other Findings   Reproductive/Obstetrics                              Anesthesia Physical Anesthesia Plan  ASA: 3  Anesthesia Plan: General   Post-op Pain Management: Ofirmev IV (intra-op)*  Induction:  Intravenous  PONV Risk Score and Plan: 1 and Ondansetron and Dexamethasone  Airway Management Planned: Oral ETT  Additional Equipment: None  Intra-op Plan:   Post-operative Plan: Extubation in OR  Informed Consent: I have reviewed the patients History and Physical, chart, labs and discussed the procedure including the risks, benefits and alternatives for the proposed anesthesia with the patient or authorized representative who has indicated his/her understanding and acceptance.     Dental advisory given  Plan Discussed with: CRNA  Anesthesia Plan Comments:          Anesthesia Quick Evaluation

## 2022-08-02 NOTE — Brief Op Note (Signed)
08/02/2022  3:15 PM  PATIENT:  [name]  62 y.o. male  PRE-OPERATIVE DIAGNOSIS:  Lumbar spinal stenosis with neurogenic claudication L4-5  POST-OPERATIVE DIAGNOSIS:  Lumbar spinal stenosis with neurogenic claudication L4-5  PROCEDURE:  Procedure(s): LUMBAR FOUR - FIVE Decompression (N/A)  SURGEON:  Surgeon(s) and Role:    Melina Schools, MD - Primary  PHYSICIAN ASSISTANT:   ASSISTANTS: none   ANESTHESIA:   general  EBL:  20 mL   BLOOD ADMINISTERED:none  DRAINS: none   LOCAL MEDICATIONS USED:  MARCAINE     SPECIMEN:  No Specimen  DISPOSITION OF SPECIMEN:  N/A  COUNTS:  YES  TOURNIQUET:  * No tourniquets in log *  DICTATION: .Dragon Dictation  PLAN OF CARE: Admit to inpatient   PATIENT DISPOSITION:  PACU - hemodynamically stable.

## 2022-08-03 ENCOUNTER — Encounter (HOSPITAL_COMMUNITY): Payer: Self-pay | Admitting: Orthopedic Surgery

## 2022-08-03 ENCOUNTER — Other Ambulatory Visit: Payer: Self-pay | Admitting: Internal Medicine

## 2022-08-03 DIAGNOSIS — M48062 Spinal stenosis, lumbar region with neurogenic claudication: Secondary | ICD-10-CM | POA: Diagnosis not present

## 2022-08-03 DIAGNOSIS — G8929 Other chronic pain: Secondary | ICD-10-CM

## 2022-08-03 NOTE — Care Management CC44 (Signed)
Condition Code 44 Documentation Completed  Patient Details  Name: [name] MRN: [id number] Date of Birth: 07/20/1960   Condition Code 44 given:  Yes Patient signature on Condition Code 44 notice:  Yes Documentation of 2 MD's agreement:  Yes Code 44 added to claim:  Yes    Pollie Friar, RN 08/03/2022, 8:01 AM
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
## 2022-08-03 NOTE — Evaluation (Signed)
Occupational Therapy Evaluation Patient Details Name: [name] MRN: [id number] DOB: May 23, 1961 Today's Date: 08/03/2022   History of Present Illness 62 yo male s/p 2/5 L4-5 decompression facetectomy and foraminotomy PMH bipolar, CAD, depression, anxiety, HTN, HLD, insomnia, panic attack,   Clinical Impression   Patient is s/p L4-5 facetectomy and forminotomy surgery resulting in functional limitations due to the deficits listed below (see OT problem list). Pt indep don doff back brace this session. Pt demonstrates full flight of steps to simulate apartment entrance but will return to sister house for the next week. Pt demonstrates dressing with figure 4 cross so no ae required though educated on reacher for getting objects off floor or shoes.Pt has handout to review.  Patient will benefit from skilled OT acutely to increase independence and safety with ADLS to allow discharge home.       Recommendations for follow up therapy are one component of a multi-disciplinary discharge planning process, led by the attending physician.  Recommendations may be updated based on patient status, additional functional criteria and insurance authorization.   Follow Up Recommendations  No OT follow up     Assistance Recommended at Discharge None  Patient can return home with the following Assist for transportation    Functional Status Assessment  Patient has had a recent decline in their functional status and demonstrates the ability to make significant improvements in function in a reasonable and predictable amount of time.  Equipment Recommendations  None recommended by OT    Recommendations for Other Services       Precautions / Restrictions Precautions Precautions: Back Precaution Comments: handout provided and reviewed Required Braces or Orthoses: Spinal Brace Spinal Brace: Lumbar corset;Applied in standing position      Mobility Bed Mobility Overal bed mobility: Modified  Independent                  Transfers Overall transfer level: Modified independent                        Balance Overall balance assessment: Mild deficits observed, not formally tested                                         ADL either performed or assessed with clinical judgement   ADL Overall ADL's : Modified independent                                       General ADL Comments: able to dress with figure 4 crossing. pt educated on reacher as pt attempting to bend over and pick up shoes. pt states "i think my mom had one of those and its at my sisters house"   Back handout provided and reviewed adls in detail. Pt educated on: clothing between brace, never sleep in brace, set an alarm at night for medication, avoid sitting for long periods of time, correct bed positioning for sleeping, correct sequence for bed mobility, avoiding lifting more than 5 pounds and never wash directly over incision. All education is complete and patient indicates understanding.   Vision Baseline Vision/History: 1 Wears glasses Ability to See in Adequate Light: 0 Adequate Patient Visual Report: No change from baseline       Perception     Praxis  Pertinent Vitals/Pain Pain Assessment Pain Assessment: Faces Faces Pain Scale: Hurts little more Pain Location: back Pain Descriptors / Indicators: Sore Pain Intervention(s): Monitored during session, Premedicated before session, Repositioned     Hand Dominance Right   Extremity/Trunk Assessment Upper Extremity Assessment Upper Extremity Assessment: Overall WFL for tasks assessed   Lower Extremity Assessment Lower Extremity Assessment: Generalized weakness;RLE deficits/detail;LLE deficits/detail RLE Deficits / Details: reports numbness after stairs, resolves with sitting LLE Deficits / Details: reports numbness after stairs, resolves with sitting   Cervical / Trunk Assessment Cervical /  Trunk Assessment: Back Surgery   Communication Communication Communication: No difficulties   Cognition Arousal/Alertness: Awake/alert Behavior During Therapy: WFL for tasks assessed/performed Overall Cognitive Status: Within Functional Limits for tasks assessed                                       General Comments  incision dry ang intact. educated to wash around dressing    Exercises     Shoulder Instructions      Home Living Family/patient expects to be discharged to:: Private residence Living Arrangements: Alone   Type of Home: House Home Access: Stairs to enter Technical brewer of Steps: 1 Entrance Stairs-Rails: None Home Layout: One level     Bathroom Shower/Tub: Occupational psychologist: Handicapped height     [place]: None   Additional Comments: will stay at sister house for the first week. then will return to patient apartment with full flight of steps      Prior Functioning/Environment Prior Level of Function : Driving;Independent/Modified Independent                        OT Problem List:        OT Treatment/Interventions:      OT Goals(Current goals can be found in the care plan section) Acute Rehab OT Goals Patient Stated Goal: to go home today OT Goal Formulation: With patient  OT Frequency:      Co-evaluation              AM-PAC OT "6 Clicks" Daily Activity     Outcome Measure Help from another person eating meals?: None Help from another person taking care of personal grooming?: None Help from another person toileting, which includes using toliet, bedpan, or urinal?: None Help from another person bathing (including washing, rinsing, drying)?: None Help from another person to put on and taking off regular upper body clothing?: None Help from another person to put on and taking off regular lower body clothing?: None 6 Click Score: 24   End of Session Nurse Communication: Mobility  status;Precautions  Activity Tolerance: Patient tolerated treatment well Patient left: in chair;with call bell/phone within reach  OT Visit Diagnosis: Muscle weakness (generalized) (M62.81)                Time: 5188-4166 OT Time Calculation (min): 23 min Charges:  OT General Charges $OT Visit: 1 Visit OT Evaluation $OT Eval Moderate Complexity: 1 Mod   [name], [name]  Acute Rehabilitation Services Office: [phone] .   [name] 08/03/2022, 9:35 AM

## 2022-08-03 NOTE — Care Management Obs Status (Signed)
[place] NOTIFICATION   Patient Details  Name: [name] MRN: [id number] Date of Birth: [birth year]   Medicare Observation Status Notification Given:  Yes    Pollie Friar, RN 08/03/2022, 8:01 AM

## 2022-08-03 NOTE — Progress Notes (Signed)
Physical Therapy Note  Spoke with occupational therapy after initial evaluation. OT reports patient is functioning at a high level of independence and no physical therapy is indicated at this time. PT is signing-off. Please re-order if there is any significant change in status. Thank you for this referral.  Candie Mile, PT, DPT Physical Therapist Acute Rehabilitation Services [facility]

## 2022-08-03 NOTE — Plan of Care (Signed)
  Problem: Education: Goal: Ability to verbalize activity precautions or restrictions will improve Outcome: Completed/Met Goal: Knowledge of the prescribed therapeutic regimen will improve Outcome: Completed/Met Goal: Understanding of discharge needs will improve Outcome: Completed/Met   Problem: Activity: Goal: Ability to avoid complications of mobility impairment will improve Outcome: Completed/Met Goal: Ability to tolerate increased activity will improve Outcome: Completed/Met Goal: Will remain free from falls Outcome: Completed/Met   Problem: Bowel/Gastric: Goal: Gastrointestinal status for postoperative course will improve Outcome: Completed/Met   Problem: Clinical Measurements: Goal: Ability to maintain clinical measurements within normal limits will improve Outcome: Completed/Met Goal: Postoperative complications will be avoided or minimized Outcome: Completed/Met Goal: Diagnostic test results will improve Outcome: Completed/Met   Problem: Pain Management: Goal: Pain level will decrease Outcome: Completed/Met   Problem: Skin Integrity: Goal: Will show signs of wound healing Outcome: Completed/Met   Problem: Health Behavior/Discharge Planning: Goal: Identification of resources available to assist in meeting health care needs will improve Outcome: Completed/Met   Problem: Bladder/Genitourinary: Goal: Urinary functional status for postoperative course will improve Outcome: Completed/Met Patient alert and oriented, VSS, Void, ambulate. Surgical site clean and dry. D/c instructions explain and given all questions answered. Patient d/c home per order.

## 2022-08-04 ENCOUNTER — Ambulatory Visit: Payer: Medicare Other | Admitting: Neurology

## 2022-08-06 ENCOUNTER — Encounter: Payer: Medicare Other | Admitting: Neurology

## 2022-09-01 ENCOUNTER — Other Ambulatory Visit: Payer: Self-pay | Admitting: Internal Medicine

## 2022-09-01 DIAGNOSIS — I25119 Atherosclerotic heart disease of native coronary artery with unspecified angina pectoris: Secondary | ICD-10-CM

## 2022-09-01 DIAGNOSIS — I739 Peripheral vascular disease, unspecified: Secondary | ICD-10-CM

## 2022-09-02 ENCOUNTER — Other Ambulatory Visit: Payer: Self-pay

## 2022-09-02 DIAGNOSIS — I10 Essential (primary) hypertension: Secondary | ICD-10-CM

## 2022-09-02 MED ORDER — AMLODIPINE BESYLATE 10 MG PO TABS: 10.0000 mg | ORAL_TABLET | Freq: Every day | ORAL | 3 refills | Status: DC

## 2022-10-12 ENCOUNTER — Ambulatory Visit: Payer: Medicare Other | Admitting: Internal Medicine

## 2023-01-17 ENCOUNTER — Ambulatory Visit: Payer: Medicare Other | Admitting: Cardiology

## 2023-01-17 NOTE — Progress Notes (Deleted)
    Cardiology Office Note  Date: 01/17/2023   ID: AH BOTT, DOB 1961/03/09, MRN [id number]  History of Present Illness: [name] is a 62 y.o. male last seen in January by Ms. Peck NP, I reviewed the note (I have not seen him since 2018).  Physical Exam: VS:  There were no vitals taken for this visit., BMI There is no height or weight on file to calculate BMI.  Wt Readings from Last 3 Encounters:  08/02/22 165 lb (74.8 kg)  07/23/22 162 lb 14.4 oz (73.9 kg)  07/20/22 162 lb 1.9 oz (73.5 kg)    General: Patient appears comfortable at rest. HEENT: Conjunctiva and lids normal, oropharynx clear with moist mucosa. Neck: Supple, no elevated JVP or carotid bruits, no thyromegaly. Lungs: Clear to auscultation, nonlabored breathing at rest. Cardiac: Regular rate and rhythm, no S3 or significant systolic murmur, no pericardial rub. Abdomen: Soft, nontender, no hepatomegaly, bowel sounds present, no guarding or rebound. Extremities: No pitting edema, distal pulses 2+. Skin: Warm and dry. Musculoskeletal: No kyphosis. Neuropsychiatric: Alert and oriented x3, affect grossly appropriate.  ECG:  An ECG dated 07/16/2022 was personally reviewed today and demonstrated:  Sinus rhythm with IVCD.  Labwork: 03/11/2022: ALT 19; AST 20 07/23/2022: BUN 6; Creatinine, Ser 0.87; Hemoglobin 12.3; Platelets 208; Potassium 3.6; Sodium 134     Component Value Date/Time   CHOL 191 03/11/2022 0919   TRIG 199 (H) 03/11/2022 0919   HDL 55 03/11/2022 0919   CHOLHDL 3.5 03/11/2022 0919   CHOLHDL 6.0 (H) 07/31/2015 0743   VLDL 34 (H) 07/31/2015 0743   LDLCALC 102 (H) 03/11/2022 0919   LDLDIRECT 95.3 03/25/2014 1004   Other Studies Reviewed Today:  No interval cardiac testing for review today.  Assessment and Plan:  1.  CAD status post DES to the RCA in 2004, DES x 2 to the RCA in 2012, and DES to the LAD with angioplasty of the diagonal in 2015.  Subsequent evaluation at outside facility  in [place] back in 2019 resulted in placement of DES to the mid LAD as well as angioplasty of the proximal to mid RCA and DES to the distal RCA as well as PDA.  2.  Essential hypertension.  3.  Mixed hyperlipidemia.  He has a history of statin myalgias also rash on Crestor.  Last LDL 102 in September 2023.  He had previously been on Praluent however more recently Lipitor 40 mg daily per PCP.  Disposition:  Follow up {follow up:15908}  Signed, Jonelle Sidle, M.D., F.A.C.C. [facility]

## 2023-01-18 ENCOUNTER — Encounter: Payer: Self-pay | Admitting: Cardiology

## 2023-09-23 ENCOUNTER — Telehealth: Payer: Self-pay | Admitting: Internal Medicine

## 2023-09-23 NOTE — Telephone Encounter (Unsigned)
 Copied from CRM [id number]. Topic: Referral - Request for Referral >> Sep 23, 2023 11:32 AM Gildardo Pounds wrote: Did the patient discuss referral with their provider in the last year? Yes (If No - schedule appointment) (If Yes - send message)  Appointment offered? No  Type of order/referral and detailed reason for visit: Neurologist  Preference of office, provider, location: [place] or [place]  If referral order, have you been seen by this specialty before? Yes (If Yes, this issue or another issue? When? Where?  Can we respond through MyChart? No

## 2023-09-26 NOTE — Telephone Encounter (Signed)
 Scheduled with Vernona Rieger NP patient experiencing a lot of bilateral shoulder pain numbness in fingers and cold hands.

## 2023-09-27 ENCOUNTER — Ambulatory Visit (INDEPENDENT_AMBULATORY_CARE_PROVIDER_SITE_OTHER)

## 2023-09-27 VITALS — BP 198/108 | HR 122 | Ht 65.0 in | Wt 151.0 lb

## 2023-09-27 DIAGNOSIS — I10 Essential (primary) hypertension: Secondary | ICD-10-CM

## 2023-09-27 DIAGNOSIS — M5412 Radiculopathy, cervical region: Secondary | ICD-10-CM

## 2023-09-27 DIAGNOSIS — M79642 Pain in left hand: Secondary | ICD-10-CM

## 2023-09-27 DIAGNOSIS — M79641 Pain in right hand: Secondary | ICD-10-CM | POA: Diagnosis not present

## 2023-09-27 DIAGNOSIS — F17209 Nicotine dependence, unspecified, with unspecified nicotine-induced disorders: Secondary | ICD-10-CM

## 2023-09-27 DIAGNOSIS — M4802 Spinal stenosis, cervical region: Secondary | ICD-10-CM | POA: Diagnosis not present

## 2023-09-27 DIAGNOSIS — F17200 Nicotine dependence, unspecified, uncomplicated: Secondary | ICD-10-CM

## 2023-09-27 MED ORDER — DICLOFENAC SODIUM 1 % EX GEL: 2.0000 g | Freq: Four times a day (QID) | CUTANEOUS | 2 refills | Status: DC | PRN

## 2023-09-27 MED ORDER — CARVEDILOL 12.5 MG PO TABS: 12.5000 mg | ORAL_TABLET | Freq: Two times a day (BID) | ORAL | 1 refills | Status: DC

## 2023-09-27 MED ORDER — LOSARTAN POTASSIUM 25 MG PO TABS: 25.0000 mg | ORAL_TABLET | Freq: Every day | ORAL | 1 refills | Status: DC

## 2023-09-27 NOTE — Progress Notes (Unsigned)
   Established Patient Office Visit  Subjective   Patient ID: [name], male    DOB: August 13, 1960  Age: 63 y.o. MRN: [id number]  Chief Complaint  Patient presents with   Arm Pain    Pt states arm pain from shoulders down to his finger tips , left arm is the worse. Both hands are numb and they swell up, shoulders,elbows, wrists are in pain. When asked pt on a scale of 1-10 pt stated his pain is at a 10. Pt has used various over the counter creams,sprays, and gloves and none has worked    HPI  {History (Optional):23778}  ROS    Objective:     BP (!) 198/108 (BP Location: Left Arm, Patient Position: Sitting, Cuff Size: Normal)   Pulse (!) 122   Ht 5\' 5"  (1.651 m)   Wt 151 lb 0.6 oz (68.5 kg)   SpO2 97%   BMI 25.13 kg/m  {Vitals History (Optional):23777}  Physical Exam   No results found for any visits on 09/27/23.  {Labs (Optional):23779}  The ASCVD Risk score (Arnett DK, et al., 2019) failed to calculate for the following reasons:   Risk score cannot be calculated because patient has a medical history suggesting prior/existing ASCVD    Assessment & Plan:   Problem List Items Addressed This Visit   None   No follow-ups on file.    Darral Dash, FNP

## 2023-09-27 NOTE — Patient Instructions (Addendum)
 Resume blood pressure medications.  These were sent in to your pharmacy.  F/U in 4-6 weeks for BP recheck.  Referral placed for neurologist in [place].

## 2023-09-28 DIAGNOSIS — F17209 Nicotine dependence, unspecified, with unspecified nicotine-induced disorders: Secondary | ICD-10-CM | POA: Insufficient documentation

## 2023-09-28 DIAGNOSIS — M79641 Pain in right hand: Secondary | ICD-10-CM | POA: Insufficient documentation

## 2023-09-28 NOTE — Assessment & Plan Note (Signed)
 Discussed need for smoking cessation and resources available to help with this.  He is not ready to quit at this time, but will work on reducing amount.  Will will revisit at future visit.

## 2023-09-28 NOTE — Assessment & Plan Note (Signed)
 MRI from last year showed severe stenosis of cervical spine.   He denies improvement in past with gabapentin.   Will update referral to neurology.

## 2023-09-28 NOTE — Assessment & Plan Note (Deleted)
 Discussed need for smoking cessation and resources available to help with this.  He is not ready to quit at this time, but will work on reducing amount.  Will will revisit at future visit.

## 2023-09-30 ENCOUNTER — Other Ambulatory Visit: Payer: Self-pay

## 2023-09-30 DIAGNOSIS — M5412 Radiculopathy, cervical region: Secondary | ICD-10-CM

## 2023-11-04 ENCOUNTER — Telehealth (HOSPITAL_BASED_OUTPATIENT_CLINIC_OR_DEPARTMENT_OTHER): Payer: Self-pay | Admitting: *Deleted

## 2023-11-04 NOTE — Telephone Encounter (Signed)
   Pre-operative Risk Assessment    Patient Name: [name]  DOB: Dec 11, 1960 MRN: [id number]  Date of last office visit: 07/16/2022 Date of next office visit: None  Request for Surgical Clearance    Procedure:  C3-4,C4-5,C5-6  Date of Surgery:  Clearance TBD                                 Surgeon:  Dr. Waymond Hailey Surgeon's Group or Practice Name:  [facility] Phone number:  [phone] Fax number:  [phone]   Type of Clearance Requested:   - Medical    Type of Anesthesia:  General    Additional requests/questions:    Signed, Lauris Port   11/04/2023, 4:59 PM

## 2023-11-07 NOTE — Telephone Encounter (Signed)
   Name: [name]  DOB: 1960-10-12  MRN: [id number]  Primary Cardiologist: Teddie Favre, MD  Chart reviewed as part of pre-operative protocol coverage. Because of Paschal E Hochmuth's past medical history and time since last visit, he will require a follow-up in-office visit in order to better assess preoperative cardiovascular risk.  Pre-op covering staff: - Please schedule appointment and call patient to inform them. If patient already had an upcoming appointment within acceptable timeframe, please add "pre-op clearance" to the appointment notes so provider is aware. - Please contact requesting surgeon's office via preferred method (i.e, phone, fax) to inform them of need for appointment prior to surgery.  Morey Ar, NP  11/07/2023, 4:09 PM

## 2023-11-07 NOTE — Telephone Encounter (Signed)
 1st attempt to reach pt regarding surgical clearance and the need for a IN OFFICE appointment.  Left pt a message to call back and get that scheduled.

## 2023-11-08 NOTE — Telephone Encounter (Addendum)
 2nd attempt to reach the pt. Left message to call back and schedule in office appt for preop clearance.

## 2023-11-11 NOTE — Telephone Encounter (Signed)
 I s/w the pt today that he is going to need an in office appt for preop clearance. I reviewed the [facility] office though no appts available and I was well into June at this time. I assured the pt that I will ask the [facility] scheduling team to reach out to him with an appt, to see where they may be able to get him squeezed for preop clearance. Pt thanked me for the help.

## 2023-11-14 NOTE — Telephone Encounter (Signed)
 Mr Tomerlin has been scheduled to see Cadence Gennaro Khat, [facility] 11/15/23 in the [place] office. I will update all parties involved.

## 2023-11-15 ENCOUNTER — Telehealth: Payer: Self-pay | Admitting: Pharmacy Technician

## 2023-11-15 ENCOUNTER — Ambulatory Visit: Attending: Medical | Admitting: Physician Assistant

## 2023-11-15 ENCOUNTER — Encounter: Payer: Self-pay | Admitting: Medical

## 2023-11-15 ENCOUNTER — Other Ambulatory Visit (HOSPITAL_COMMUNITY): Payer: Self-pay

## 2023-11-15 VITALS — BP 150/90 | HR 94 | Ht 66.0 in | Wt 151.0 lb

## 2023-11-15 DIAGNOSIS — F17209 Nicotine dependence, unspecified, with unspecified nicotine-induced disorders: Secondary | ICD-10-CM | POA: Diagnosis present

## 2023-11-15 DIAGNOSIS — I10 Essential (primary) hypertension: Secondary | ICD-10-CM | POA: Diagnosis present

## 2023-11-15 DIAGNOSIS — Z01810 Encounter for preprocedural cardiovascular examination: Secondary | ICD-10-CM | POA: Diagnosis present

## 2023-11-15 DIAGNOSIS — E785 Hyperlipidemia, unspecified: Secondary | ICD-10-CM | POA: Insufficient documentation

## 2023-11-15 DIAGNOSIS — I739 Peripheral vascular disease, unspecified: Secondary | ICD-10-CM | POA: Diagnosis present

## 2023-11-15 DIAGNOSIS — Z79899 Other long term (current) drug therapy: Secondary | ICD-10-CM | POA: Insufficient documentation

## 2023-11-15 DIAGNOSIS — I2510 Atherosclerotic heart disease of native coronary artery without angina pectoris: Secondary | ICD-10-CM | POA: Diagnosis present

## 2023-11-15 MED ORDER — CARVEDILOL 25 MG PO TABS
25.0000 mg | ORAL_TABLET | Freq: Two times a day (BID) | ORAL | 3 refills | Status: AC
  Filled 2024-06-12 – 2024-08-01 (×2): qty 180, 90d supply, fill #0

## 2023-11-15 MED ORDER — LOSARTAN POTASSIUM 25 MG PO TABS
25.0000 mg | ORAL_TABLET | Freq: Every day | ORAL | 3 refills | Status: AC
  Filled 2024-06-12: qty 90, 90d supply, fill #0
  Filled ????-??-??: fill #0

## 2023-11-15 NOTE — Telephone Encounter (Signed)
 Hi, I called the patient and had to leave him a message. The losartan  and carvedilol  are both on our 5.00 list for 30 days. If he is willing to get them at one of our [facility] outpatient pharmacies or have it mailed from one of them then that would be the option for assistance we could provide. I will make an encounter to try to get in touch with him. Thank you ===View-only below this line=== ----- Message ----- From: Annabel Barns, RN Sent: 11/15/2023   4:01 PM EDT To: Rx Med Assistance Team Subject: help with medication                           Hello,  Is there anything you can do to help this patient with medications. His losartan  and coreg  for 30 days cost him $17 and he cannot afford.  Thank you,Cathey

## 2023-11-15 NOTE — Patient Instructions (Signed)
 Medication Instructions:  START Carvedilol  25 mg Twice a day- your cost is $ 8.20 per month  START Losartan  25 mg daily- your cost is $ 9.00 per month   START Aspirin  81 daily-ger the "enteric coated" -protects your stomach  Labwork: CMET and Lipids in 2 weeks  Testing/Procedures: None   Follow-Up: 2 weeks with Scottie Dunlap,PA-C  Any Other Special Instructions Will Be Listed Below (If Applicable).  If you need a refill on your cardiac medications before your next appointment, please call your pharmacy.

## 2023-11-15 NOTE — Telephone Encounter (Signed)
 Spoke to patient and all of our locations are too far from him. Quickest one was 43 mins from him. Called [facility] back and cost is 9.00 for one and 8.20 for the other. That is with the discount of store and goodrx. He does not have insurance. Lmom for patient

## 2023-11-15 NOTE — Progress Notes (Signed)
 Cardiology Office Note:  .   Date:  11/15/2023  ID:  [name], DOB [birth year], MRN [id number] PCP: Tobi Fortes, MD   HeartCare Providers Cardiologist:  Teddie Favre, MD { History of Present Illness: [name]   [name] is a 63 y.o. male  with PMHx of CAD s/p STEMI (s/p prior stenting in 2003, inferior STEMI in 2012 with DESx2 to RCA, DES to proximal-LAD and POBA of D2 in 02/2014,and now DES to PDA, DES to RCA, and DES to LAD in 06/2018. 2020: DES to mid LAD, distal RCA, and ostium of the right posterior descending, 50% in LCx, minimal in left main.), PVD, HTN, HLD, history of substance use, GERD, bipolar disorder, tobacco use reports to office for cardiac preop assessment.   Procedure:  C3-4,C4-5,C5-6 Date of Surgery:  Clearance TBD                              Surgeon:  Dr. Waymond Hailey Surgeon's Group or Practice Name:  [facility]  Last seen in heartcare 07/16/2022 with Lasalle Pointer, NP for follow-up and preop clearance for lumbar decompression of L4-L5.  At that time, he denied any cardiac symptoms.  BP on arrival was 156/84 and repeat BP was 150/80.  Added losartan  25 daily. Discussed BP log and follow-up BMET.   Reports constant nerve pain that radiates down arm and legs x years. Denied any cardiac symptoms including chest pain, shortness of breath, palpitations, syncope, presyncope, dizziness, orthopnea, PND, swelling, or claudication.  Stopped all medications except Advil about one year due to financial concerns.  At that time, he had Medicare which is the same insurance he has now and states that medication cost was not affordable with over $100 cost. Reports home cooked meal with baked food, low sodium. Reports fluid intake include body armour (15 mg of Na), x2 16 oz water, x2 12 oz coke (45 mg of Na each). Reports walking 1 miles about 2 -3 x per week. Able to walk up steps. Live alone and able to care for self.  Reports tobacco use 1 PPD x 30  years. Denies Binging ETOH/drug use   Studies Reviewed: .   Echocardiogram on 05/22/2014: - Left ventricle: The cavity size was normal. Systolic function was    normal. The estimated ejection fraction was in the range of 50%    to 55%. There is hypokinesis of the basalinferoseptal myocardium.    Doppler parameters are consistent with abnormal left ventricular    relaxation (grade 1 diastolic dysfunction).  - Mitral valve: There was mild regurgitation.  - Pulmonary arteries: Systolic pressure was mildly increased. PA    peak pressure: 35 mm Hg (S).   EKG Interpretation Date/Time:  Tuesday Nov 15 2023 14:55:07 EDT Ventricular Rate:  94 PR Interval:  142 QRS Duration:  106 QT Interval:  374 QTC Calculation: 467 R Axis:   64  Text Interpretation: Normal sinus rhythm Normal ECG When compared with ECG of 17-Nov-2020 14:21, No significant change was found Confirmed by Odetta Benes ([id number]) on 11/15/2023 3:03:04 PM   Risk Assessment/Calculations:     HYPERTENSION CONTROL Vitals:   11/15/23 1447 11/15/23 1528  BP: (!) 158/100 (!) 150/90    The patient's blood pressure is elevated above target today.  In order to address the patient's elevated BP: A new medication was prescribed today.          Physical Exam:  VS:  BP (!) 150/90 (BP Location: Right Arm, Patient Position: Sitting, Cuff Size: Normal)   Pulse 94   Ht 5\' 6"  (1.676 m)   Wt 151 lb (68.5 kg)   SpO2 98%   BMI 24.37 kg/m    Wt Readings from Last 3 Encounters:  11/15/23 151 lb (68.5 kg)  09/27/23 151 lb 0.6 oz (68.5 kg)  08/02/22 165 lb (74.8 kg)    GEN: Well nourished, well developed in no acute distress NECK: No JVD; No carotid bruits CARDIAC: RRR, no murmurs, rubs, gallops RESPIRATORY:  Clear to auscultation without rales, wheezing or rhonchi  ABDOMEN: Soft, non-tender, non-distended EXTREMITIES:  No edema; No deformity   ASSESSMENT AND PLAN: .   Preoperative Cardiovascular Risk Assessment Revised  Cardiac Risk Index (RCRI) with 1 point and perioperative risk of major cardiac events is 0.9%.  Duke Activity Status Index (DASI) with > 4 mets .  From cardiac standpoint, this patient has a low cardiac risk. Therefore, based on ACC/AHA guidelines, patient at acceptable risk for the planned procedure without further cardiovascular testing.   However, I would recommend getting blood pressure under control prior to surgery.  Restarted BP medications today and patient will follow-up in 2 weeks.  Aspirin  can be held for 7 days prior to his surgery.  Please resume Aspirin  post operatively when it is felt to be safe from a bleeding standpoint.   CAD s/p STEMI HLD s/p prior stenting in 2003, inferior STEMI in 2012 with DESx2 to RCA, DES to proximal-LAD and POBA of D2 in 02/2014,and now DES to PDA, DES to RCA, and DES to LAD in 06/2018.  Cath 2020: DES to mid LAD, distal RCA, and ostium of the right posterior descending, 50% in LCx, minimal in left main.  Recommended to continue ASA and Plavix  daily for at least 12 months. Previously on ASA and Plavix  but d/c for 1 year. Will restart ASA today. Would defer long term DAPT to MD.  Denied any chest pain 2023: LDL 102, AST/ALT 20/19; 06/2022: K 3.6, Cr 0.87 Not taking any medications X 1 year due to financial issues.  Ordered ASA 81 mg, losartan  25 mg and Coreg  25 mg daily.  Ordered FLP, CMP in 2 weeks  Follow-up in 2 weeks for BP check and management. Will try next visit to restart statin after CMP lab reviewed.   HTN  Last OV 06/2018 for BP was elevated.  Added losartan  25 mg daily. Today BP in office: 158/100, repeat BP 150/100 Not taking any medications X 1 year due to financial issues. Will apply for medications assistance.  Reports fluid intake include body armour (15 mg of Na), x2 16 oz water, x2 12 oz coke (45 mg of Na each). Discussed avoiding sodium rich beverages.  Recommended increasing water intake.  Also to limit daily consumption of both Body  Armor and Coke.  Suggested alternating between Body Armor x1 1 day and Coke x1 the next day  Agreed with restarting BP medications with 2-week follow-up and labs (CMP, FLP) Order losartan  25 mg and Coreg  25 mg daily  Tobacco use Reports tobacco use 1 PPD x 30 years.  Previously tried nicotine  patches but unsuccessful.  No desire to quit at this time. Encouraged cessation.   PVD (peripheral vascular disease) (HCC) 03/2022 Vas US  Axillary femoral graft: Widely patent right axillobifemoral bypass graft (obtained in [place] ) 03/2022: Mild L/R LE PAD  02/2021 Carotid US : 1-39 % R/L ICA  Denies any claudications, dizziness or syncope  Ordered ASA. Will attempt to restart statin in 2 weeks as above.   Dispo: Ordered ASA 81 mg, losartan  25 mg and Coreg  25 mg daily. Ordered FLP, CMP in 2 weeks. Follow up in 2 weeks.  Signed, Metta Actis, PA-C

## 2023-11-28 ENCOUNTER — Other Ambulatory Visit (HOSPITAL_COMMUNITY)
Admission: RE | Admit: 2023-11-28 | Discharge: 2023-11-28 | Disposition: A | Discharge status: Home / Self Care | Source: Ambulatory Visit | Attending: Physician Assistant | Admitting: Physician Assistant

## 2023-11-28 ENCOUNTER — Ambulatory Visit: Admitting: Physician Assistant

## 2023-11-28 DIAGNOSIS — Z79899 Other long term (current) drug therapy: Secondary | ICD-10-CM | POA: Insufficient documentation

## 2023-11-28 LAB — LIPID PANEL
Cholesterol: 208 mg/dL — ABNORMAL HIGH (ref 0–200)
HDL: 54 mg/dL (ref >40)
LDL Cholesterol: 135 mg/dL — ABNORMAL HIGH (ref 0–99)
Total CHOL/HDL Ratio: 3.9 ratio
Triglycerides: 97 mg/dL (ref <150)
VLDL: 19 mg/dL (ref 0–40)

## 2023-11-28 LAB — COMPREHENSIVE METABOLIC PANEL WITH GFR
ALT: 30 U/L (ref 0–44)
AST: 30 U/L (ref 15–41)
Albumin: 3.8 g/dL (ref 3.5–5.0)
Alkaline Phosphatase: 84 U/L (ref 38–126)
Anion gap: 10 (ref 5–15)
BUN: 7 mg/dL — ABNORMAL LOW (ref 8–23)
CO2: 21 mmol/L — ABNORMAL LOW (ref 22–32)
Calcium: 9.2 mg/dL (ref 8.9–10.3)
Chloride: 106 mmol/L (ref 98–111)
Creatinine, Ser: 0.99 mg/dL (ref 0.61–1.24)
GFR, Estimated: >60 mL/min (ref >60)
Glucose, Bld: 109 mg/dL — ABNORMAL HIGH (ref 70–99)
Potassium: 4.3 mmol/L (ref 3.5–5.1)
Sodium: 137 mmol/L (ref 135–145)
Total Bilirubin: 0.9 mg/dL (ref 0.0–1.2)
Total Protein: 7.5 g/dL (ref 6.5–8.1)

## 2023-11-28 NOTE — Progress Notes (Signed)
 Cardiology Office Note:  .   Date:  11/29/2023  ID:  [name], DOB Jan 16, 1961, MRN [id number] PCP: Tobi Fortes, MD  [facility] Providers Cardiologist:  Teddie Favre, MD { History of Present Illness: [name]   [name] is a 63 y.o. male  with PMHx of  CAD s/p STEMI (s/p prior stenting in 2003, inferior STEMI in 2012 with DESx2 to RCA, DES to proximal-LAD and POBA of D2 in 02/2014, and 05/2018: DES to mid LAD, distal RCA, and ostium of the right posterior descending, 50% in LCx, minimal in left main.), PVD, HTN, HLD, history of substance use, GERD, bipolar disorder, tobacco use  reports to office for follow up on HTN.   Last seen in heartcare 11/15/2023 with me for cardiac preop assessment.  At that time, denied any cardiac complaints.  Noted constant nerve pain in arms and legs.  He stopped taking all medications for 1 year due to financial concerns.  BP was elevated initially 158/100 with repeat BP 150/90.  Restarted ASA 81 mg, losartan  25 mg and Coreg  25 mg BID.   Today, denied any cardiac symptoms including chest pain, shortness of breath, palpitations, syncope, presyncope, dizziness, orthopnea, PND, edema. Reports compliance with medications. Reports home cooked meal with baked food, low sodium. Reduced sodium intake from fluids and increased water intake. Reports fluid intake include x4-5 16 oz water, x1 12 oz coke (45 mg of Na each). Reports walking 1 miles about 2 -3 x per week. Able to walk up steps. Live alone and able to care for self.  Reports tobacco use 1 PPD x 30 years. Denies Binging ETOH/drug use   Studies Reviewed: .    Echocardiogram on 05/22/2014: - Left ventricle: The cavity size was normal. Systolic function was    normal. The estimated ejection fraction was in the range of 50%    to 55%. There is hypokinesis of the basalinferoseptal myocardium.    Doppler parameters are consistent with abnormal left ventricular    relaxation (grade 1 diastolic  dysfunction).  - Mitral valve: There was mild regurgitation.  - Pulmonary arteries: Systolic pressure was mildly increased. PA    peak pressure: 35 mm Hg (S).   Physical Exam:   VS:  BP 118/68   Pulse 65   Ht 5\' 6"  (1.676 m)   Wt 153 lb 9.6 oz (69.7 kg)   SpO2 96%   BMI 24.79 kg/m    Wt Readings from Last 3 Encounters:  11/29/23 153 lb 9.6 oz (69.7 kg)  11/15/23 151 lb (68.5 kg)  09/27/23 151 lb 0.6 oz (68.5 kg)    GEN: Sitting in chair in no acute distress NECK: No JVD; No carotid bruits CARDIAC: RRR, no murmurs, rubs, gallops RESPIRATORY:  Clear to auscultation without rales, wheezing or rhonchi  ABDOMEN: Soft, non-tender, non-distended EXTREMITIES:  No edema; No deformity   ASSESSMENT AND PLAN: .   HTN  Last OV 10/2023 for BP was elevated and no BP med x 1 year.  Restarted  losartan  25 mg and Coreg  25 mg BID.  Today BP in office: 118/68  Reduced sodium intake from fluids and increased water intake. Reports fluid intake include x4-5 16 oz water, x1 12 oz coke (45 mg of Na each).  Continue losartan  25 mg and Coreg  25 mg BID.  Discussed the importance of continuing medication compliance. Encourage healthy lifestyle including exercise, diet, and tobacco cessation.   Preoperative Cardiovascular Risk Assessment Revised Cardiac Risk Index (RCRI)  with 1 point and perioperative risk of major cardiac events is 0.9%.  Duke Activity Status Index (DASI) with > 4 mets .  From cardiac standpoint, this patient has a low cardiac risk. Therefore, based on ACC/AHA guidelines, patient at acceptable risk for the planned procedure without further cardiovascular testing.   Aspirin  can be held for 5 days prior to his surgery.  Please resume Aspirin  post operatively when it is felt to be safe from a bleeding standpoint.  Previous recommend getting BP control on 11/15/2023 before proceeding with surgery. Today, BP is well controlled and patient has been compliant with HTN medication. Patient is cleared  for surgery from cardiology perspective.  CAD s/p STEMI HLD, LDL goal < 55 s/p prior stenting in 2003, inferior STEMI in 2012 with DESx2 to RCA, DES to proximal-LAD and POBA of D2 in 02/2014,and now DES to PDA, DES to RCA, and DES to LAD in 05/2018.  Cath 05/2018: DES to mid LAD, distal RCA, and ostium of the right posterior descending, 50% in LCx, minimal in left main.  Recommended to continue ASA and Plavix  daily for at least 12 months. Stopped ASA and Plavix  for 1 year.  Denied any chest pain On 11/15/2023 restarted ASA 81 mg, losartan  25 mg and Coreg  25 mg BID. Reports compliance today. Continue these meds.  Follow up labs 11/28/2023: LDL 135, K 4.3, Cr 0.99, AST/ALT WNL  Reviewed labs. Discussed importance of statin and goal LDL of < 55. Ordered Lipitor  40 mg. ADDENDUM 12/01/2023: Per Dr. Londa Rival, would plan to continue Plavix  with history of multiple stents. However, with noncompliance history, would focus on ASA and titrate therapy prior to adding Plavix . Will attempt to restart Plavix  at next visit    Tobacco use Reports tobacco use 1 PPD x 30 years.  Previously tried nicotine  patches but unsuccessful.  No desire to quit at this time. Encouraged cessation.    PVD (peripheral vascular disease) (HCC) 03/2022 Vas US  Axillary femoral graft: Widely patent right axillobifemoral bypass graft (obtained in [place] ) 03/2022: Mild L/R LE PAD  02/2021 Carotid US : 1-39 % R/L ICA  Denies any claudications, dizziness or syncope Continue ASA 81 mg  Order statin as above.    Dispo: Ordered Lipitor  40 mg. F/U lab in 3 months. F/U appt in 3-4 months after labs.    Signed, Metta Actis, PA-C

## 2023-11-29 ENCOUNTER — Ambulatory Visit: Attending: Physician Assistant | Admitting: Physician Assistant

## 2023-11-29 ENCOUNTER — Encounter: Payer: Self-pay | Admitting: Physician Assistant

## 2023-11-29 VITALS — BP 118/68 | HR 65 | Ht 66.0 in | Wt 153.6 lb

## 2023-11-29 DIAGNOSIS — F17209 Nicotine dependence, unspecified, with unspecified nicotine-induced disorders: Secondary | ICD-10-CM | POA: Diagnosis present

## 2023-11-29 DIAGNOSIS — E785 Hyperlipidemia, unspecified: Secondary | ICD-10-CM | POA: Diagnosis not present

## 2023-11-29 DIAGNOSIS — I10 Essential (primary) hypertension: Secondary | ICD-10-CM | POA: Diagnosis not present

## 2023-11-29 DIAGNOSIS — I739 Peripheral vascular disease, unspecified: Secondary | ICD-10-CM | POA: Diagnosis present

## 2023-11-29 DIAGNOSIS — I2510 Atherosclerotic heart disease of native coronary artery without angina pectoris: Secondary | ICD-10-CM | POA: Diagnosis not present

## 2023-11-29 DIAGNOSIS — Z01810 Encounter for preprocedural cardiovascular examination: Secondary | ICD-10-CM | POA: Diagnosis not present

## 2023-11-29 MED ORDER — ATORVASTATIN CALCIUM 40 MG PO TABS: 40.0000 mg | ORAL_TABLET | Freq: Every day | ORAL | 3 refills | Status: AC

## 2023-11-29 NOTE — Patient Instructions (Signed)
 Medication Instructions:  Your physician has recommended you make the following change in your medication:   -Start Atorvastatin  40 mg once daily   *If you need a refill on your cardiac medications before your next appointment, please call your pharmacy*  Lab Work: In 3-4 months: - CMP -FLP (fasting, nothing to eat/drink at least 6 hours prior to having lab completed)  If you have labs (blood work) drawn today and your tests are completely normal, you will receive your results only by: MyChart Message (if you have MyChart) OR A paper copy in the mail If you have any lab test that is abnormal or we need to change your treatment, we will call you to review the results.  Testing/Procedures: None  Follow-Up: At [facility], you and your health needs are our priority.  As part of our continuing mission to provide you with exceptional heart care, our providers are all part of one team.  This team includes your primary Cardiologist (physician) and Advanced Practice Providers or APPs (Physician Assistants and Nurse Practitioners) who all work together to provide you with the care you need, when you need it.  Your next appointment:   3-4 month(s)  Provider:   You may see Teddie Favre, MD or one of the following Advanced Practice Providers on your designated Care Team:   Woodfin Hays, PA-C  [place], [place] Theotis Flake, [place]     We recommend signing up for the patient portal called "MyChart".  Sign up information is provided on this After Visit Summary.  MyChart is used to connect with patients for Virtual Visits (Telemedicine).  Patients are able to view lab/test results, encounter notes, upcoming appointments, etc.  Non-urgent messages can be sent to your provider as well.   To learn more about what you can do with MyChart, go to [email or web address].   Other Instructions

## 2023-12-01 ENCOUNTER — Other Ambulatory Visit: Payer: Self-pay | Admitting: Neurological Surgery

## 2023-12-15 ENCOUNTER — Encounter (HOSPITAL_COMMUNITY): Payer: Self-pay | Admitting: Neurological Surgery

## 2023-12-15 ENCOUNTER — Other Ambulatory Visit: Payer: Self-pay

## 2023-12-15 NOTE — Progress Notes (Signed)
 Anesthesia Chart Review: Same day workup  Pt is a 63 y.o. yo male current smoker who follows with cardiology for history of  CAD s/p STEMI (s/p prior stenting in 2003, inferior STEMI in 2012 with DESx2 to RCA, DES to proximal-LAD and POBA of D2 in 02/2014, and 05/2018: DES to mid LAD, distal RCA, and ostium of the right posterior descending, 50% in LCx, minimal in left main.), PVD, HTN, HLD.  Seen by Odetta Benes, PA-C on 11/15/2023 for preop evaluation; at that time it was noted that he had stopped taking all medications for 1 year due to financial concerns.  He was reinitiated on ASA 81 mg, losartan  25 mg, and Coreg  25 mg twice daily.  He was seen in follow-up on 11/29/2023 and reported doing well, walking 1 mile 2-3 times per week, able to go up steps without issue.  Blood pressure well-controlled, 118/68.  Per note, Preoperative Cardiovascular Risk Assessment: Revised Cardiac Risk Index (RCRI) with 1 point and perioperative risk of major cardiac events is 0.9%. Duke Activity Status Index (DASI) with > 4 mets. From cardiac standpoint, this patient has a low cardiac risk. Therefore, based on ACC/AHA guidelines, patient at acceptable risk for the planned procedure without further cardiovascular testing. Aspirin  can be held for 5 days prior to his surgery.  Please resume Aspirin  post operatively when it is felt to be safe from a bleeding standpoint. Previous recommend getting BP control on 11/15/2023 before proceeding with surgery. Today, BP is well controlled and patient has been compliant with HTN medication. Patient is cleared for surgery from cardiology perspective.  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  Ultrasound 03/30/2022 showed widely patent right axillobifemoral bypass graft.  Other pertinent history includes GERD, bipolar disorder  CMP 11/28/23 reviewed, unremarkable.  He will need day of surgery  labs and evaluation.  EKG 11/15/2023: NSR.  Rate 94.  Cath and PCI 06/30/2018: PCI conclusions: PCI on the 80% stenosis in the mid LAD.  PCI using a drug-eluting Onyx 2.75 x 22 DES stent. PCI of the 80% stenosis in the distal RCA.  PCI using a drug-eluting Onyx 2.75 x 38 DES stent. PCI on 70% stenosis at the ostium of the right posterior descending.  PCI using a drug-eluting Onyx 2.25 by 8 DES stent.  Summary: 1.  HPI and indications: Unstable angina. 2.  Left main: Minor luminal irregularities. 3.  LAD: Mid lesion: 80% stenosis. 4.  LCx: Proximal lesion: 50% stenosis. 5.  RCA: Distal lesion: 80% stenosis.  Proximal lesion: 50% restenosis. 6.  Right posterior descending: Ostial lesion: 70% stenosis. 7.  3V CAD in a right dominant system. 8.  Normal LVEDP equals simple 8 mmHg and no transaortic valvular gradient. 9.  Successful PCI of the mid LAD artery was performed using a 2.75 x 22 mm Onyx drug-eluting stent. 10.  Successful PCI of the distal RCA was performed using a 2.75 x 38 mm Onyx drug-eluting stent.  The stent balloon catheter was used for angioplasty of the proximal to mid RCA stenosis. 11.  Successful PCI of the RPDA was performed using a 2.25 x 8 mm Onyx drug-eluting stent.     Edilia Gordon [facility]/Anesthesiology Phone [phone] 12/15/2023 10:19 AM

## 2023-12-15 NOTE — Pre-Procedure Instructions (Signed)
-------------    SDW INSTRUCTIONS given:  Your procedure is scheduled on 6/20.  Report to [facility] Main Entrance A at 0530 A.M., and check in at the Admitting office.  Any questions or running late day of surgery: call [phone]    Remember:  Do not eat after midnight the night before your surgery  You may drink clear liquids until 04:30 AM the morning of your surgery.   Clear liquids allowed are: Water, Non-Citrus Juices (without pulp), Carbonated Beverages, Clear Tea, Black Coffee Only, and Gatorade    Take these medicines the morning of surgery with A SIP OF WATER  carvedilol  (COREG )   Follow your surgeon's instructions on when to stop Aspirin .  If no instructions were given by your surgeon then you will need to call the office to get those instructions.    As of today, STOP taking any Aleve, Naproxen, Ibuprofen, Motrin, Advil, Goody's, BC's, all herbal medications, fish oil, and all vitamins.   Do NOT Smoke (Tobacco/Vaping) 24 hours prior to your procedure  If you use a CPAP at night, you may bring all equipment for your overnight stay.     You will be asked to remove any contacts, glasses, piercing's, hearing aid's, dentures/partials prior to surgery. Please bring cases for these items if needed.     Patients discharged the day of surgery will not be allowed to drive home, and someone needs to stay with them for 24 hours.  SURGICAL WAITING ROOM VISITATION Patients may have no more than 2 support people in the waiting area - these visitors may rotate.   Pre-op nurse will coordinate an appropriate time for 1 ADULT support person, who may not rotate, to accompany patient in pre-op.  Children under the age of 30 must have an adult with them who is not the patient and must remain in the main waiting area with an adult.  If the patient needs to stay at the hospital during part of their recovery, the visitor guidelines for inpatient rooms apply.  Please refer to the [facility] website for the visitor guidelines for any additional information.   Special instructions:   - Preparing For Surgery   Please follow these instructions carefully.   Shower the NIGHT BEFORE SURGERY and the MORNING OF SURGERY with DIAL Soap.   Pat yourself dry with a CLEAN TOWEL.  Wear CLEAN PAJAMAS to bed the night before surgery  Place CLEAN SHEETS on your bed the night of your first shower and DO NOT SLEEP WITH PETS.   Additional instructions for the day of surgery: DO NOT APPLY any lotions, deodorants, cologne, or perfumes.   Do not wear jewelry or makeup Do not wear nail polish, gel polish, artificial nails, or any other type of covering on natural nails (fingers and toes) Do not bring valuables to the hospital. [facility] is not responsible for valuables/personal belongings. Put on clean/comfortable clothes.  Please brush your teeth.  Ask your nurse before applying any prescription medications to the skin.

## 2023-12-15 NOTE — Anesthesia Preprocedure Evaluation (Addendum)
 Anesthesia Evaluation  Patient identified by MRN, date of birth, ID band Patient awake    Reviewed: Allergy & Precautions, NPO status , Patient's Chart, lab work & pertinent test results  History of Anesthesia Complications Negative for: history of anesthetic complications  Airway Mallampati: II  TM Distance: >3 FB Neck ROM: Full    Dental  (+) Teeth Intact, Dental Advisory Given,    Pulmonary neg shortness of breath, neg sleep apnea, neg COPD, neg recent URI, Current Smoker and Patient abstained from smoking.   breath sounds clear to auscultation       Cardiovascular hypertension, (-) angina + CAD, + Past MI, + Cardiac Stents and + Peripheral Vascular Disease   Rhythm:Regular  Coronary artery disease      a. DES to RCA 2003 b. DES x 2 RCA 2012 with acute MI (cardiogenic shock and VF) c. DES proximal LAD and PTCA diagonal 2015 d. DES to RCA, DES to LAD, and DES to PDA in 06/2018 ( at outside hospital in [place] )     Neuro/Psych  PSYCHIATRIC DISORDERS Anxiety Depression Bipolar Disorder   Cervical myelopathy  Neuromuscular disease    GI/Hepatic Neg liver ROS,GERD  ,,  Endo/Other  negative endocrine ROS    Renal/GU negative Renal ROS     Musculoskeletal negative musculoskeletal ROS (+)    Abdominal   Peds  Hematology  (+) Blood dyscrasia, anemia Lab Results      Component                Value               Date                      WBC                      7.2                 07/23/2022                HGB                      12.3 (L)            07/23/2022                HCT                      36.8 (L)            07/23/2022                MCV                      104.8 (H)           07/23/2022                PLT                      208                 07/23/2022              Anesthesia Other Findings   Reproductive/Obstetrics                              Anesthesia Physical Anesthesia  Plan  ASA: 3  Anesthesia Plan: General   Post-op Pain Management: Ofirmev  IV (intra-op)*   Induction: Intravenous  PONV Risk Score and Plan: 2 and Ondansetron , Dexamethasone  and Treatment may vary due to age or medical condition  Airway Management Planned: Oral ETT and Video Laryngoscope Planned  Additional Equipment: Arterial line  Intra-op Plan:   Post-operative Plan: Extubation in OR  Informed Consent: I have reviewed the patients History and Physical, chart, labs and discussed the procedure including the risks, benefits and alternatives for the proposed anesthesia with the patient or authorized representative who has indicated his/her understanding and acceptance.     Dental advisory given  Plan Discussed with: CRNA  Anesthesia Plan Comments: (PAT note by Rudy Costain, PA-C:  Pt is a 63 y.o. yo male current smoker who follows with cardiology for history of  CAD s/p STEMI (s/p prior stenting in 2003, inferior STEMI in 2012 with DESx2 to RCA, DES to proximal-LAD and POBA of D2 in 02/2014, and 05/2018: DES to mid LAD, distal RCA, and ostium of the right posterior descending, 50% in LCx, minimal in left main.), PVD, HTN, HLD.  Seen by Odetta Benes, PA-C on 11/15/2023 for preop evaluation; at that time it was noted that he had stopped taking all medications for 1 year due to financial concerns.  He was reinitiated on ASA 81 mg, losartan  25 mg, and Coreg  25 mg twice daily.  He was seen in follow-up on 11/29/2023 and reported doing well, walking 1 mile 2-3 times per week, able to go up steps without issue.  Blood pressure well-controlled, 118/68.  Per note, Preoperative Cardiovascular Risk Assessment: Revised Cardiac Risk Index (RCRI) with 1 point and perioperative risk of major cardiac events is 0.9%. Duke Activity Status Index (DASI) with > 4 mets. From cardiac standpoint, this patient has a low cardiac risk. Therefore, based on ACC/AHA guidelines, patient at acceptable risk for the  planned procedure without further cardiovascular testing. Aspirin  can be held for 5 days prior to his surgery.  Please resume Aspirin  post operatively when it is felt to be safe from a bleeding standpoint. Previous recommend getting BP control on 11/15/2023 before proceeding with surgery. Today, BP is well controlled and patient has been compliant with HTN medication. Patient is cleared for surgery from cardiology perspective.  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  Ultrasound 03/30/2022 showed widely patent right axillobifemoral bypass graft.  Other pertinent history includes GERD, bipolar disorder  CMP 11/28/23 reviewed, unremarkable.  He will need day of surgery labs and evaluation.  EKG 11/15/2023: NSR.  Rate 94.  Cath and PCI 06/30/2018: PCI conclusions: PCI on the 80% stenosis in the mid LAD.  PCI using a drug-eluting Onyx 2.75 x 22 DES stent. PCI of the 80% stenosis in the distal RCA.  PCI using a drug-eluting Onyx 2.75 x 38 DES stent. PCI on 70% stenosis at the ostium of the right posterior descending.  PCI using a drug-eluting Onyx 2.25 by 8 DES stent.  Summary: 1.  HPI and indications: Unstable angina. 2.  Left main: Minor luminal irregularities. 3.  LAD: Mid lesion: 80% stenosis. 4.  LCx: Proximal lesion: 50% stenosis. 5.  RCA: Distal lesion: 80% stenosis.  Proximal lesion: 50% restenosis. 6.  Right posterior descending: Ostial lesion: 70% stenosis. 7.  3V CAD in a right dominant system. 8.  Normal LVEDP equals simple 8 mmHg and no transaortic valvular gradient. 9.  Successful PCI of  the mid LAD artery was performed using a 2.75 x 22 mm Onyx drug-eluting stent. 10.  Successful PCI of the distal RCA was performed using a 2.75 x 38 mm Onyx drug-eluting stent.  The stent balloon catheter was used for angioplasty of the proximal to mid RCA stenosis. 11.  Successful PCI of the RPDA was  performed using a 2.25 x 8 mm Onyx drug-eluting stent.   )         Anesthesia Quick Evaluation

## 2023-12-15 NOTE — Progress Notes (Signed)
 PCP - Dr. Saint Cranker Cardiologist - Dr. Teddie Favre  PPM/ICD - denies   Chest x-ray - 01/18/14 EKG - 11/15/23 Stress Test - 05/16/14 ECHO - 05/22/14 Cardiac Cath - 06/09/18  CPAP - denies  DM- denies  Blood Thinner Instructions: n/a Aspirin  Instructions: hold 7 days. Last dose 6/12  ERAS Protcol - clears until 0430  COVID TEST- n/a  Anesthesia review: yes, cardiac hx  Patient verbally denies any shortness of breath, fever, cough and chest pain during phone call      Questions were answered. Patient verbalized understanding of instructions.

## 2023-12-16 ENCOUNTER — Other Ambulatory Visit: Payer: Self-pay

## 2023-12-16 ENCOUNTER — Inpatient Hospital Stay (HOSPITAL_COMMUNITY)
Admission: RE | Admit: 2023-12-16 | Discharge: 2023-12-17 | DRG: 473 | Disposition: A | Discharge status: Home / Self Care | Attending: Neurological Surgery | Admitting: Neurological Surgery

## 2023-12-16 ENCOUNTER — Inpatient Hospital Stay (HOSPITAL_COMMUNITY)

## 2023-12-16 ENCOUNTER — Inpatient Hospital Stay (HOSPITAL_COMMUNITY): Payer: Self-pay | Admitting: Physician Assistant

## 2023-12-16 ENCOUNTER — Encounter (HOSPITAL_COMMUNITY)
Admission: RE | Disposition: A | Discharge status: Home / Self Care | Payer: Self-pay | Source: Home / Self Care | Attending: Neurological Surgery

## 2023-12-16 ENCOUNTER — Encounter (HOSPITAL_COMMUNITY): Payer: Self-pay | Admitting: Neurological Surgery

## 2023-12-16 DIAGNOSIS — Z87442 Personal history of urinary calculi: Secondary | ICD-10-CM | POA: Diagnosis not present

## 2023-12-16 DIAGNOSIS — Z8249 Family history of ischemic heart disease and other diseases of the circulatory system: Secondary | ICD-10-CM

## 2023-12-16 DIAGNOSIS — Z79899 Other long term (current) drug therapy: Secondary | ICD-10-CM | POA: Diagnosis not present

## 2023-12-16 DIAGNOSIS — Z818 Family history of other mental and behavioral disorders: Secondary | ICD-10-CM | POA: Diagnosis not present

## 2023-12-16 DIAGNOSIS — I2510 Atherosclerotic heart disease of native coronary artery without angina pectoris: Secondary | ICD-10-CM

## 2023-12-16 DIAGNOSIS — I10 Essential (primary) hypertension: Secondary | ICD-10-CM | POA: Diagnosis present

## 2023-12-16 DIAGNOSIS — Z881 Allergy status to other antibiotic agents status: Secondary | ICD-10-CM

## 2023-12-16 DIAGNOSIS — Z981 Arthrodesis status: Principal | ICD-10-CM

## 2023-12-16 DIAGNOSIS — Z7982 Long term (current) use of aspirin: Secondary | ICD-10-CM | POA: Diagnosis not present

## 2023-12-16 DIAGNOSIS — E785 Hyperlipidemia, unspecified: Secondary | ICD-10-CM

## 2023-12-16 DIAGNOSIS — M4802 Spinal stenosis, cervical region: Principal | ICD-10-CM | POA: Diagnosis present

## 2023-12-16 DIAGNOSIS — F418 Other specified anxiety disorders: Secondary | ICD-10-CM

## 2023-12-16 DIAGNOSIS — K219 Gastro-esophageal reflux disease without esophagitis: Secondary | ICD-10-CM | POA: Diagnosis present

## 2023-12-16 DIAGNOSIS — F319 Bipolar disorder, unspecified: Secondary | ICD-10-CM | POA: Diagnosis present

## 2023-12-16 DIAGNOSIS — M50221 Other cervical disc displacement at C4-C5 level: Secondary | ICD-10-CM | POA: Diagnosis present

## 2023-12-16 DIAGNOSIS — M50222 Other cervical disc displacement at C5-C6 level: Secondary | ICD-10-CM | POA: Diagnosis present

## 2023-12-16 DIAGNOSIS — I252 Old myocardial infarction: Secondary | ICD-10-CM

## 2023-12-16 DIAGNOSIS — Z888 Allergy status to other drugs, medicaments and biological substances status: Secondary | ICD-10-CM

## 2023-12-16 DIAGNOSIS — M5412 Radiculopathy, cervical region: Secondary | ICD-10-CM | POA: Diagnosis not present

## 2023-12-16 DIAGNOSIS — Z9049 Acquired absence of other specified parts of digestive tract: Secondary | ICD-10-CM

## 2023-12-16 DIAGNOSIS — Z800 Family history of malignant neoplasm of digestive organs: Secondary | ICD-10-CM | POA: Diagnosis not present

## 2023-12-16 DIAGNOSIS — M479 Spondylosis, unspecified: Secondary | ICD-10-CM | POA: Diagnosis present

## 2023-12-16 DIAGNOSIS — F17210 Nicotine dependence, cigarettes, uncomplicated: Secondary | ICD-10-CM | POA: Diagnosis present

## 2023-12-16 DIAGNOSIS — Z955 Presence of coronary angioplasty implant and graft: Secondary | ICD-10-CM

## 2023-12-16 HISTORY — PX: ANTERIOR CERVICAL DECOMP/DISCECTOMY FUSION: SHX1161

## 2023-12-16 LAB — CBC
HCT: 44.7 % (ref 39.0–52.0)
Hemoglobin: 15 g/dL (ref 13.0–17.0)
MCH: 31.7 pg (ref 26.0–34.0)
MCHC: 33.6 g/dL (ref 30.0–36.0)
MCV: 94.5 fL (ref 80.0–100.0)
Platelets: 122 10*3/uL — ABNORMAL LOW (ref 150–400)
RBC: 4.73 MIL/uL (ref 4.22–5.81)
RDW: 13.9 % (ref 11.5–15.5)
WBC: 6.9 10*3/uL (ref 4.0–10.5)
nRBC: 0 % (ref 0.0–0.2)

## 2023-12-16 LAB — TYPE AND SCREEN
ABO/RH(D): A POS
Antibody Screen: NEGATIVE

## 2023-12-16 LAB — SURGICAL PCR SCREEN
MRSA, PCR: NEGATIVE
Staphylococcus aureus: NEGATIVE

## 2023-12-16 SURGERY — ANTERIOR CERVICAL DECOMPRESSION/DISCECTOMY FUSION 3 LEVELS
Anesthesia: General

## 2023-12-16 MED ORDER — ACETAMINOPHEN 500 MG PO TABS
1000.0000 mg | ORAL_TABLET | ORAL | Status: AC
  Administered 2023-12-16: 1000 mg via ORAL
  Filled 2023-12-16: qty 2

## 2023-12-16 MED ORDER — DEXMEDETOMIDINE HCL IN NACL 80 MCG/20ML IV SOLN
INTRAVENOUS | Status: DC | PRN
  Administered 2023-12-16 (×3): 4 ug via INTRAVENOUS

## 2023-12-16 MED ORDER — METHOCARBAMOL 500 MG PO TABS
500.0000 mg | ORAL_TABLET | Freq: Once | ORAL | Status: AC
  Administered 2023-12-16: 500 mg via ORAL

## 2023-12-16 MED ORDER — BUPIVACAINE HCL (PF) 0.25 % IJ SOLN
INTRAMUSCULAR | Status: AC
  Filled 2023-12-16: qty 30

## 2023-12-16 MED ORDER — SODIUM CHLORIDE 0.9 % IV SOLN
250.0000 mL | INTRAVENOUS | Status: DC
  Administered 2023-12-16: 250 mL via INTRAVENOUS

## 2023-12-16 MED ORDER — MIDAZOLAM HCL 2 MG/2ML IJ SOLN
INTRAMUSCULAR | Status: DC | PRN
  Administered 2023-12-16: 2 mg via INTRAVENOUS

## 2023-12-16 MED ORDER — DROPERIDOL 2.5 MG/ML IJ SOLN: 0.6250 mg | Freq: Once | INTRAMUSCULAR | Status: DC | PRN

## 2023-12-16 MED ORDER — CEFAZOLIN SODIUM-DEXTROSE 2-4 GM/100ML-% IV SOLN
2.0000 g | INTRAVENOUS | Status: AC
  Administered 2023-12-16: 2 g via INTRAVENOUS
  Filled 2023-12-16: qty 100

## 2023-12-16 MED ORDER — OXYCODONE HCL 5 MG/5ML PO SOLN: 5.0000 mg | Freq: Once | ORAL | Status: AC | PRN

## 2023-12-16 MED ORDER — CHLORHEXIDINE GLUCONATE CLOTH 2 % EX PADS: 6.0000 | MEDICATED_PAD | Freq: Once | CUTANEOUS | Status: DC

## 2023-12-16 MED ORDER — SODIUM CHLORIDE 0.9% FLUSH
3.0000 mL | Freq: Two times a day (BID) | INTRAVENOUS | Status: DC
  Administered 2023-12-16: 10 mL via INTRAVENOUS

## 2023-12-16 MED ORDER — HYDROMORPHONE HCL 1 MG/ML IJ SOLN
0.2500 mg | INTRAMUSCULAR | Status: DC | PRN
  Administered 2023-12-16 (×4): 0.25 mg via INTRAVENOUS

## 2023-12-16 MED ORDER — SODIUM CHLORIDE 0.9% FLUSH: 3.0000 mL | Freq: Two times a day (BID) | INTRAVENOUS | Status: DC

## 2023-12-16 MED ORDER — THROMBIN 5000 UNITS EX KIT
PACK | CUTANEOUS | Status: AC
  Filled 2023-12-16: qty 1

## 2023-12-16 MED ORDER — ONDANSETRON HCL 4 MG PO TABS: 4.0000 mg | ORAL_TABLET | Freq: Four times a day (QID) | ORAL | Status: DC | PRN

## 2023-12-16 MED ORDER — MIDAZOLAM HCL 2 MG/2ML IJ SOLN
INTRAMUSCULAR | Status: AC
  Filled 2023-12-16: qty 2

## 2023-12-16 MED ORDER — DEXAMETHASONE SODIUM PHOSPHATE 10 MG/ML IJ SOLN
INTRAMUSCULAR | Status: DC | PRN
  Administered 2023-12-16: 10 mg via INTRAVENOUS

## 2023-12-16 MED ORDER — ROCURONIUM BROMIDE 10 MG/ML (PF) SYRINGE
PREFILLED_SYRINGE | INTRAVENOUS | Status: AC
  Filled 2023-12-16: qty 10

## 2023-12-16 MED ORDER — HYDROMORPHONE HCL 1 MG/ML IJ SOLN
0.2500 mg | INTRAMUSCULAR | Status: DC | PRN
  Administered 2023-12-16 (×2): 0.5 mg via INTRAVENOUS
  Administered 2023-12-16 (×2): 0.25 mg via INTRAVENOUS
  Administered 2023-12-16: 0.5 mg via INTRAVENOUS

## 2023-12-16 MED ORDER — PROPOFOL 10 MG/ML IV BOLUS
INTRAVENOUS | Status: AC
  Filled 2023-12-16: qty 20

## 2023-12-16 MED ORDER — ONDANSETRON HCL 4 MG/2ML IJ SOLN: 4.0000 mg | Freq: Four times a day (QID) | INTRAMUSCULAR | Status: DC | PRN

## 2023-12-16 MED ORDER — HYDROMORPHONE HCL 1 MG/ML IJ SOLN
INTRAMUSCULAR | Status: AC
  Filled 2023-12-16: qty 1

## 2023-12-16 MED ORDER — ONDANSETRON HCL 4 MG/2ML IJ SOLN
INTRAMUSCULAR | Status: DC | PRN
  Administered 2023-12-16: 4 mg via INTRAVENOUS

## 2023-12-16 MED ORDER — LOSARTAN POTASSIUM 25 MG PO TABS
25.0000 mg | ORAL_TABLET | Freq: Every day | ORAL | Status: DC
  Administered 2023-12-16: 25 mg via ORAL
  Filled 2023-12-16: qty 1

## 2023-12-16 MED ORDER — THROMBIN (RECOMBINANT) 5000 UNITS EX SOLR
CUTANEOUS | Status: DC | PRN
  Administered 2023-12-16: 10 mL via TOPICAL

## 2023-12-16 MED ORDER — BUPIVACAINE HCL (PF) 0.25 % IJ SOLN
INTRAMUSCULAR | Status: DC | PRN
Start: 2023-12-16 — End: 2023-12-16
  Administered 2023-12-16: 7 mL

## 2023-12-16 MED ORDER — SUGAMMADEX SODIUM 200 MG/2ML IV SOLN
INTRAVENOUS | Status: DC | PRN
  Administered 2023-12-16: 200 mg via INTRAVENOUS

## 2023-12-16 MED ORDER — ORAL CARE MOUTH RINSE: 15.0000 mL | Freq: Once | OROMUCOSAL | Status: AC

## 2023-12-16 MED ORDER — HYDROMORPHONE HCL 1 MG/ML IJ SOLN
INTRAMUSCULAR | Status: AC
  Filled 2023-12-16: qty 0.5

## 2023-12-16 MED ORDER — DEXAMETHASONE 4 MG PO TABS
4.0000 mg | ORAL_TABLET | Freq: Four times a day (QID) | ORAL | Status: DC
  Administered 2023-12-16 – 2023-12-17 (×4): 4 mg via ORAL
  Filled 2023-12-16 (×4): qty 1

## 2023-12-16 MED ORDER — THROMBIN 5000 UNITS EX KIT
PACK | CUTANEOUS | Status: AC
  Filled 2023-12-16: qty 2

## 2023-12-16 MED ORDER — DEXAMETHASONE SODIUM PHOSPHATE 4 MG/ML IJ SOLN: 4.0000 mg | Freq: Four times a day (QID) | INTRAMUSCULAR | Status: DC

## 2023-12-16 MED ORDER — FENTANYL CITRATE (PF) 100 MCG/2ML IJ SOLN
INTRAMUSCULAR | Status: AC
  Filled 2023-12-16: qty 2

## 2023-12-16 MED ORDER — ACETAMINOPHEN 10 MG/ML IV SOLN
INTRAVENOUS | Status: AC
  Filled 2023-12-16: qty 100

## 2023-12-16 MED ORDER — PHENYLEPHRINE HCL-NACL 20-0.9 MG/250ML-% IV SOLN
INTRAVENOUS | Status: DC | PRN
  Administered 2023-12-16: 50 ug/min via INTRAVENOUS

## 2023-12-16 MED ORDER — HYDROCODONE-ACETAMINOPHEN 10-325 MG PO TABS
1.0000 | ORAL_TABLET | ORAL | Status: DC | PRN
  Administered 2023-12-16 – 2023-12-17 (×4): 1 via ORAL
  Filled 2023-12-16 (×4): qty 1

## 2023-12-16 MED ORDER — OXYCODONE HCL 5 MG PO TABS
ORAL_TABLET | ORAL | Status: AC
  Filled 2023-12-16: qty 1

## 2023-12-16 MED ORDER — FENTANYL CITRATE (PF) 250 MCG/5ML IJ SOLN
INTRAMUSCULAR | Status: DC | PRN
  Administered 2023-12-16 (×2): 50 ug via INTRAVENOUS
  Administered 2023-12-16: 100 ug via INTRAVENOUS
  Administered 2023-12-16: 50 ug via INTRAVENOUS

## 2023-12-16 MED ORDER — THROMBIN 5000 UNITS EX SOLR
OROMUCOSAL | Status: DC | PRN
  Administered 2023-12-16: 5 mL via TOPICAL

## 2023-12-16 MED ORDER — LACTATED RINGERS IV SOLN: INTRAVENOUS | Status: DC

## 2023-12-16 MED ORDER — 0.9 % SODIUM CHLORIDE (POUR BTL) OPTIME
TOPICAL | Status: DC | PRN
  Administered 2023-12-16: 1000 mL

## 2023-12-16 MED ORDER — LIDOCAINE 2% (20 MG/ML) 5 ML SYRINGE
INTRAMUSCULAR | Status: DC | PRN
  Administered 2023-12-16: 100 mg via INTRAVENOUS

## 2023-12-16 MED ORDER — CHLORHEXIDINE GLUCONATE 0.12 % MT SOLN
15.0000 mL | Freq: Once | OROMUCOSAL | Status: AC
  Administered 2023-12-16: 15 mL via OROMUCOSAL
  Filled 2023-12-16: qty 15

## 2023-12-16 MED ORDER — LIDOCAINE 2% (20 MG/ML) 5 ML SYRINGE
INTRAMUSCULAR | Status: AC
  Filled 2023-12-16: qty 5

## 2023-12-16 MED ORDER — METHOCARBAMOL 1000 MG/10ML IJ SOLN: 500.0000 mg | Freq: Four times a day (QID) | INTRAMUSCULAR | Status: DC | PRN

## 2023-12-16 MED ORDER — MORPHINE SULFATE (PF) 2 MG/ML IV SOLN: 2.0000 mg | INTRAVENOUS | Status: DC | PRN

## 2023-12-16 MED ORDER — ROCURONIUM BROMIDE 10 MG/ML (PF) SYRINGE
PREFILLED_SYRINGE | INTRAVENOUS | Status: DC | PRN
  Administered 2023-12-16: 10 mg via INTRAVENOUS
  Administered 2023-12-16: 20 mg via INTRAVENOUS
  Administered 2023-12-16: 60 mg via INTRAVENOUS
  Administered 2023-12-16: 10 mg via INTRAVENOUS

## 2023-12-16 MED ORDER — CARVEDILOL 25 MG PO TABS
25.0000 mg | ORAL_TABLET | Freq: Two times a day (BID) | ORAL | Status: DC
  Administered 2023-12-16: 25 mg via ORAL
  Filled 2023-12-16: qty 1

## 2023-12-16 MED ORDER — PHENYLEPHRINE 80 MCG/ML (10ML) SYRINGE FOR IV PUSH (FOR BLOOD PRESSURE SUPPORT)
PREFILLED_SYRINGE | INTRAVENOUS | Status: AC
  Filled 2023-12-16: qty 10

## 2023-12-16 MED ORDER — HYDROMORPHONE HCL 1 MG/ML IJ SOLN
INTRAMUSCULAR | Status: DC | PRN
  Administered 2023-12-16: .5 mg via INTRAVENOUS

## 2023-12-16 MED ORDER — CEFAZOLIN SODIUM-DEXTROSE 2-4 GM/100ML-% IV SOLN
2.0000 g | Freq: Three times a day (TID) | INTRAVENOUS | Status: DC
  Administered 2023-12-16: 2 g via INTRAVENOUS
  Filled 2023-12-16: qty 100

## 2023-12-16 MED ORDER — FENTANYL CITRATE (PF) 100 MCG/2ML IJ SOLN
25.0000 ug | INTRAMUSCULAR | Status: DC | PRN
  Administered 2023-12-16 (×3): 50 ug via INTRAVENOUS

## 2023-12-16 MED ORDER — ACETAMINOPHEN 325 MG PO TABS: 650.0000 mg | ORAL_TABLET | ORAL | Status: DC | PRN

## 2023-12-16 MED ORDER — DEXAMETHASONE SODIUM PHOSPHATE 10 MG/ML IJ SOLN
INTRAMUSCULAR | Status: AC
  Filled 2023-12-16: qty 1

## 2023-12-16 MED ORDER — PROPOFOL 10 MG/ML IV BOLUS
INTRAVENOUS | Status: DC | PRN
  Administered 2023-12-16: 50 mg via INTRAVENOUS
  Administered 2023-12-16: 100 mg via INTRAVENOUS

## 2023-12-16 MED ORDER — FENTANYL CITRATE (PF) 250 MCG/5ML IJ SOLN
INTRAMUSCULAR | Status: AC
  Filled 2023-12-16: qty 5

## 2023-12-16 MED ORDER — METHOCARBAMOL 500 MG PO TABS
ORAL_TABLET | ORAL | Status: AC
  Filled 2023-12-16: qty 1

## 2023-12-16 MED ORDER — GABAPENTIN 300 MG PO CAPS
300.0000 mg | ORAL_CAPSULE | ORAL | Status: DC
  Filled 2023-12-16: qty 1

## 2023-12-16 MED ORDER — ACETAMINOPHEN 10 MG/ML IV SOLN
1000.0000 mg | Freq: Once | INTRAVENOUS | Status: DC | PRN
  Administered 2023-12-16: 1000 mg via INTRAVENOUS

## 2023-12-16 MED ORDER — ACETAMINOPHEN 650 MG RE SUPP
650.0000 mg | RECTAL | Status: DC | PRN
Start: 2023-12-16 — End: 2023-12-17

## 2023-12-16 MED ORDER — METHOCARBAMOL 500 MG PO TABS
500.0000 mg | ORAL_TABLET | Freq: Four times a day (QID) | ORAL | Status: DC | PRN
  Administered 2023-12-16 – 2023-12-17 (×2): 500 mg via ORAL
  Filled 2023-12-16 (×2): qty 1

## 2023-12-16 MED ORDER — SODIUM CHLORIDE 0.9% FLUSH: 3.0000 mL | INTRAVENOUS | Status: DC | PRN

## 2023-12-16 MED ORDER — MENTHOL 3 MG MT LOZG
1.0000 | LOZENGE | OROMUCOSAL | Status: DC | PRN
  Filled 2023-12-16: qty 9

## 2023-12-16 MED ORDER — PHENOL 1.4 % MT LIQD: 1.0000 | OROMUCOSAL | Status: DC | PRN

## 2023-12-16 MED ORDER — OXYCODONE HCL 5 MG PO TABS
5.0000 mg | ORAL_TABLET | Freq: Once | ORAL | Status: AC | PRN
  Administered 2023-12-16: 5 mg via ORAL

## 2023-12-16 MED ORDER — ONDANSETRON HCL 4 MG/2ML IJ SOLN
INTRAMUSCULAR | Status: AC
  Filled 2023-12-16: qty 2

## 2023-12-16 MED ORDER — SENNA 8.6 MG PO TABS
1.0000 | ORAL_TABLET | Freq: Two times a day (BID) | ORAL | Status: DC
  Administered 2023-12-16: 8.6 mg via ORAL
  Filled 2023-12-16: qty 1

## 2023-12-16 MED ORDER — ADULT MULTIVITAMIN W/MINERALS CH
1.0000 | ORAL_TABLET | Freq: Every day | ORAL | Status: DC
  Administered 2023-12-16: 1 via ORAL
  Filled 2023-12-16: qty 1

## 2023-12-16 MED ORDER — PHENYLEPHRINE 80 MCG/ML (10ML) SYRINGE FOR IV PUSH (FOR BLOOD PRESSURE SUPPORT)
PREFILLED_SYRINGE | INTRAVENOUS | Status: DC | PRN
  Administered 2023-12-16 (×2): 80 ug via INTRAVENOUS
  Administered 2023-12-16: 160 ug via INTRAVENOUS
  Administered 2023-12-16: 80 ug via INTRAVENOUS
  Administered 2023-12-16 (×2): 160 ug via INTRAVENOUS

## 2023-12-16 SURGICAL SUPPLY — 50 items
BAG COUNTER SPONGE SURGICOUNT (BAG) ×1 IMPLANT
BAND RUBBER #18 3X1/16 STRL (MISCELLANEOUS) ×2 IMPLANT
BASKET BONE COLLECTION (BASKET) ×1 IMPLANT
BENZOIN TINCTURE PRP APPL 2/3 (GAUZE/BANDAGES/DRESSINGS) ×1 IMPLANT
BUR CARBIDE MATCH 3.0 (BURR) ×1 IMPLANT
CANISTER SUCTION 3000ML PPV (SUCTIONS) ×1 IMPLANT
DERMABOND ADVANCED .7 DNX12 (GAUZE/BANDAGES/DRESSINGS) IMPLANT
DRAPE C-ARM 42X72 X-RAY (DRAPES) ×2 IMPLANT
DRAPE LAPAROTOMY 100X72 PEDS (DRAPES) ×1 IMPLANT
DRAPE MICROSCOPE SLANT 54X150 (MISCELLANEOUS) IMPLANT
DRSG OPSITE POSTOP 4X6 (GAUZE/BANDAGES/DRESSINGS) IMPLANT
DURAPREP 6ML APPLICATOR 50/CS (WOUND CARE) ×1 IMPLANT
ELECT COATED BLADE 2.86 ST (ELECTRODE) ×1 IMPLANT
ELECTRODE REM PT RTRN 9FT ADLT (ELECTROSURGICAL) ×1 IMPLANT
GAUZE 4X4 16PLY ~~LOC~~+RFID DBL (SPONGE) IMPLANT
GLOVE BIO SURGEON STRL SZ7 (GLOVE) ×1 IMPLANT
GLOVE BIO SURGEON STRL SZ8 (GLOVE) ×1 IMPLANT
GLOVE BIOGEL PI IND STRL 7.0 (GLOVE) ×1 IMPLANT
GOWN STRL REUS W/ TWL LRG LVL3 (GOWN DISPOSABLE) ×1 IMPLANT
GOWN STRL REUS W/ TWL XL LVL3 (GOWN DISPOSABLE) ×1 IMPLANT
GOWN STRL REUS W/TWL 2XL LVL3 (GOWN DISPOSABLE) IMPLANT
HEMOSTAT POWDER KIT SURGIFOAM (HEMOSTASIS) ×1 IMPLANT
KIT BASIN OR (CUSTOM PROCEDURE TRAY) ×1 IMPLANT
KIT TURNOVER KIT B (KITS) ×1 IMPLANT
NDL HYPO 25X1 1.5 SAFETY (NEEDLE) ×1 IMPLANT
NDL SPNL 20GX3.5 QUINCKE YW (NEEDLE) ×1 IMPLANT
NEEDLE HYPO 25X1 1.5 SAFETY (NEEDLE) ×1 IMPLANT
NEEDLE SPNL 20GX3.5 QUINCKE YW (NEEDLE) ×1 IMPLANT
NS IRRIG 1000ML POUR BTL (IV SOLUTION) ×1 IMPLANT
PACK LAMINECTOMY NEURO (CUSTOM PROCEDURE TRAY) ×1 IMPLANT
PAD ARMBOARD POSITIONER FOAM (MISCELLANEOUS) ×1 IMPLANT
PATTIES SURGICAL .5 X.5 (GAUZE/BANDAGES/DRESSINGS) ×1 IMPLANT
PATTIES SURGICAL 1X1 (DISPOSABLE) ×1 IMPLANT
PIN DISTRACTION 14MM (PIN) ×2 IMPLANT
PLATE LOCK ACP INSIG 54 3L (Plate) IMPLANT
PUTTY BONE 1CC (Putty) IMPLANT
SCREW VA SINGLE LEAD 4X14 ST (Screw) IMPLANT
SCREW VA SINGLE LEAD 4X16 (Screw) IMPLANT
SPACER IDENTITI 6X16X14 7D (Screw) IMPLANT
SPACER IDENTITI 7X16X14 7D (Spacer) IMPLANT
SPONGE INTESTINAL PEANUT (DISPOSABLE) ×1 IMPLANT
SPONGE SURGIFOAM ABS GEL 100 (HEMOSTASIS) IMPLANT
SPONGE SURGIFOAM ABS GEL SZ50 (HEMOSTASIS) IMPLANT
STRIP CLOSURE SKIN 1/2X4 (GAUZE/BANDAGES/DRESSINGS) ×1 IMPLANT
SUT VIC AB 3-0 SH 8-18 (SUTURE) ×1 IMPLANT
SUT VIC AB 4-0 PS2 18 (SUTURE) IMPLANT
SYR 30ML SLIP (SYRINGE) ×1 IMPLANT
TOWEL GREEN STERILE (TOWEL DISPOSABLE) ×1 IMPLANT
TOWEL GREEN STERILE FF (TOWEL DISPOSABLE) ×1 IMPLANT
WATER STERILE IRR 1000ML POUR (IV SOLUTION) ×1 IMPLANT

## 2023-12-16 NOTE — Anesthesia Procedure Notes (Signed)
 Arterial Line Insertion Start/End6/20/2025 7:15 AM Performed by: Colen Daunt, CRNA  Patient location: Pre-op. Preanesthetic checklist: patient identified, IV checked, site marked, risks and benefits discussed, surgical consent, monitors and equipment checked, pre-op evaluation, timeout performed and anesthesia consent Lidocaine  1% used for infiltration Catheter size: 20 G  Attempts: 1 Procedure performed without using ultrasound guided technique. Following insertion, dressing applied and Biopatch. Patient tolerated the procedure well with no immediate complications.

## 2023-12-16 NOTE — Anesthesia Postprocedure Evaluation (Signed)
 Anesthesia Post Note  Patient: [name]  Procedure(s) Performed: ANTERIOR CERVICAL DECOMPRESSION/DISCECTOMY FUSION CERVICAL THREE-CERVICAL FOUR - CERVICAL FOUR-CERVICAL FIVE - CERIVCAL FIVE-CERVICAL SIX     Patient location during evaluation: PACU Anesthesia Type: General Level of consciousness: awake and alert Pain management: pain level controlled Vital Signs Assessment: post-procedure vital signs reviewed and stable Respiratory status: spontaneous breathing, nonlabored ventilation, respiratory function stable and patient connected to nasal cannula oxygen Cardiovascular status: blood pressure returned to baseline and stable Postop Assessment: no apparent nausea or vomiting Anesthetic complications: no   No notable events documented.  Last Vitals:  Vitals:   12/16/23 1330 12/16/23 1358  BP: (!) 105/91 (!) 152/89  Pulse: 80 79  Resp: 12 20  Temp: 36.5 C 36.7 C  SpO2: 95% 99%    Last Pain:  Vitals:   12/16/23 1358  TempSrc: Oral  PainSc:                  [name]
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
## 2023-12-16 NOTE — Op Note (Signed)
 12/16/2023  10:42 AM  PATIENT:  [name]  63 y.o. male  PRE-OPERATIVE DIAGNOSIS: Cervical spinal stenosis with neck pain and bilateral arm pain with numbness and tingling in the hands   POST-OPERATIVE DIAGNOSIS:  same  PROCEDURE:  1. Decompressive anterior cervical discectomy C3-4 C4-5 C5-6, 2. Anterior cervical arthrodesis C3-4 C4-5 C5-6 utilizing a PTI interbody cage packed with locally harvested morcellized autologous bone graft and DBM, 3. Anterior cervical plating C3-C6 inclusive utilizing a ATEC plate  SURGEON:  Waymond Hailey, MD  ASSISTANTS: None  ANESTHESIA:   General  EBL: 50 ml  Total I/O In: 1000 [I.V.:1000] Out: -   BLOOD ADMINISTERED: none  DRAINS: none  SPECIMEN:  none  INDICATION FOR PROCEDURE: This patient presented with neck pain with arm pain and numbness and tingling in the hands. Imaging showed cervical spinal stenosis. The patient tried conservative measures without relief. Pain was debilitating. Recommended ACDF with plating. Patient understood the risks, benefits, and alternatives and potential outcomes and wished to proceed.  PROCEDURE DETAILS: Patient was brought to the operating room placed under general endotracheal anesthesia. Patient was placed in the supine position on the operating room table. The neck was prepped with Duraprep and draped in a sterile fashion.   Three cc of local anesthesia was injected and a transverse incision was made on the right side of the neck.  Dissection was carried down thru the subcutaneous tissue and the platysma was  elevated, opened, and undermined with Metzenbaum scissors.  Dissection was then carried out thru an avascular plane leaving the sternocleidomastoid carotid artery and jugular vein laterally and the trachea and esophagus medially with the assistance of my nurse practitioner. The ventral aspect of the vertebral column was identified and a localizing x-ray was taken. The C3-4 C4-5 and C5-6 level was  identified and all in the room agreed with the level. The longus colli muscles were then elevated and the retractor was placed with the assistance of my nurse practitioner. The annulus was incised and the disc space entered. Discectomy was performed with micro-curettes and pituitary rongeurs. I then used the high-speed drill to drill the endplates down to the level of the posterior longitudinal ligament. The drill shavings were saved in a mucous trap for later arthrodesis. The operating microscope was draped and brought into the field provided additional magnification, illumination and visualization. Discectomy was continued posteriorly thru the disc space. Posterior longitudinal ligament was opened with a nerve hook, and then removed along with disc herniation and osteophytes, decompressing the spinal canal and thecal sac. We then continued to remove osteophytic overgrowth and disc material decompressing the neural foramina and exiting nerve roots bilaterally. The scope was angled up and down to help decompress and undercut the vertebral bodies.  His bone was of very poor quality.  It was quite soft.  His tissues were friable.  Once the decompression was completed we could pass a nerve hook circumferentially to assure adequate decompression in the midline and in the neural foramina. So by both visualization and palpation we felt we had an adequate decompression of the neural elements. We then measured the height of the intravertebral disc space and selected a 7 millimeter PTI interbody cage packed with autograft and DBM. It was then gently positioned in the intravertebral disc space(s) and countersunk. I then used a 54 mm ATEC plate and placed variable angle screws into the vertebral bodies of each level and locked them into position. The wound was irrigated with bacitracin solution, checked for  hemostasis which was established and confirmed. Once meticulous hemostasis was achieved, we then proceeded with closure  with the assistance of my nurse practitioner. The platysma was closed with interrupted 3-0 undyed Vicryl suture, the subcuticular layer was closed with interrupted 3-0 undyed Vicryl suture. The skin edges were approximated with steristrips. The drapes were removed. A sterile dressing was applied. The patient was then awakened from general anesthesia and transferred to the recovery room in stable condition. At the end of the procedure all sponge, needle and instrument counts were correct.   PLAN OF CARE: Admit to inpatient   PATIENT DISPOSITION:  PACU - hemodynamically stable.   Delay start of Pharmacological VTE agent (>24hrs) due to surgical blood loss or risk of bleeding:  yes

## 2023-12-16 NOTE — Plan of Care (Signed)

## 2023-12-16 NOTE — Transfer of Care (Signed)
 Immediate Anesthesia Transfer of Care Note  Patient: [name]  Procedure(s) Performed: ANTERIOR CERVICAL DECOMPRESSION/DISCECTOMY FUSION CERVICAL THREE-CERVICAL FOUR - CERVICAL FOUR-CERVICAL FIVE - CERIVCAL FIVE-CERVICAL SIX  Patient Location: PACU  Anesthesia Type:General  Level of Consciousness: awake and alert   Airway & Oxygen Therapy: Patient Spontanous Breathing and Patient connected to face mask oxygen  Post-op Assessment: Report given to RN and Post -op Vital signs reviewed and stable  Post vital signs: Reviewed and stable  Last Vitals:  Vitals Value Taken Time  BP 147/93 12/16/23 10:47  Temp    Pulse 74 12/16/23 10:52  Resp 17 12/16/23 10:52  SpO2 95 % 12/16/23 10:52  Vitals shown include unfiled device data.  Last Pain:  Vitals:   12/16/23 0621  TempSrc:   PainSc: 9          Complications: No notable events documented.

## 2023-12-16 NOTE — Progress Notes (Signed)
 Orthopedic Tech Progress Note Patient Details:  [name] 1961-02-20 [id number]  Ortho Devices Type of Ortho Device: Soft collar Ortho Device/Splint Interventions: Ordered, Application, Adjustment   Post Interventions Patient Tolerated: Well Instructions Provided: Care of device, Adjustment of device  [name] 12/16/2023, 3:55 PM

## 2023-12-16 NOTE — Anesthesia Procedure Notes (Signed)
 Procedure Name: Intubation Date/Time: 12/16/2023 8:01 AM  Performed by: Keondra Haydu C, CRNAPre-anesthesia Checklist: Patient identified, Emergency Drugs available, Suction available and Patient being monitored Patient Re-evaluated:Patient Re-evaluated prior to induction Oxygen Delivery Method: Circle System Utilized Preoxygenation: Pre-oxygenation with 100% oxygen Induction Type: IV induction Ventilation: Mask ventilation without difficulty Laryngoscope Size: Glidescope and 3 Grade View: Grade I Tube type: Oral Tube size: 7.5 mm Number of attempts: 1 Airway Equipment and Method: Stylet and Oral airway Placement Confirmation: ETT inserted through vocal cords under direct vision, positive ETCO2 and breath sounds checked- equal and bilateral Secured at: 22 cm Tube secured with: Tape Dental Injury: Teeth and Oropharynx as per pre-operative assessment  Comments: Intubation by Darlina Einstein, SRNA.

## 2023-12-17 MED ORDER — SENNA 8.6 MG PO TABS: 1.0000 | ORAL_TABLET | Freq: Two times a day (BID) | ORAL | 0 refills | Status: DC

## 2023-12-17 MED ORDER — HYDROCODONE-ACETAMINOPHEN 10-325 MG PO TABS: 1.0000 | ORAL_TABLET | ORAL | 0 refills | Status: DC | PRN

## 2023-12-17 MED ORDER — METHOCARBAMOL 500 MG PO TABS: 500.0000 mg | ORAL_TABLET | Freq: Four times a day (QID) | ORAL | 0 refills | Status: DC | PRN

## 2023-12-17 NOTE — Progress Notes (Signed)
 Patient is discharged from room 3C04 at this time. Alert and in stable condition. IV site d/c'd and instructions read to patient and spouse with understanding verbalized and all questions answered. Ambulate out of unit with all belongings at side.

## 2023-12-17 NOTE — Discharge Summary (Signed)
 Physician Discharge Summary     Providing Compassionate, Quality Care - Together   Patient ID: [name] MRN: [id number] DOB/AGE: 65-Nov-1962 65 y.o.  Admit date: 12/16/2023 Discharge date: 12/17/2023  Admission Diagnoses: Cervical stenosis  Discharge Diagnoses:  Principal Problem:   S/P cervical spinal fusion   Discharged Condition: good  Hospital Course: Patient underwent a C3-4, C4-5, C5-6 ACDF by Dr. Joshua on 12/16/2023. He was admitted to 3C04  following recovery from anesthesia in the PACU. His postoperative course has been uncomplicated. He has worked with therapies who feel the patient is ready for discharge home. He is ambulating independently and without difficulty. He is tolerating a normal diet. He is not having any bowel or bladder dysfunction. His pain is well-controlled with oral pain medication. He is ready for discharge home.   Consults: None  Significant Diagnostic Studies: radiology: DG Cervical Spine 1 View Result Date: 12/16/2023 CLINICAL DATA:  Elective surgery EXAM: DG CERVICAL SPINE - 1 VIEW COMPARISON:  MRI 07/29/2022 FINDINGS: Two low resolution intraoperative spot views of the cervical spine. Total fluoroscopy time was 19.9 seconds, fluoroscopic dose of 1.87 mGy. The images demonstrate anterior plate, fixating screws and interbody devices from C3 through C6. IMPRESSION: Intraoperative fluoroscopic assistance provided during cervical spine surgery. Electronically Signed   By: Luke Bun M.D.   On: 12/16/2023 15:13    Treatments: surgery: 1. Decompressive anterior cervical discectomy C3-4 C4-5 C5-6, 2. Anterior cervical arthrodesis C3-4 C4-5 C5-6 utilizing a PTI interbody cage packed with locally harvested morcellized autologous bone graft and DBM, 3. Anterior cervical plating C3-C6 inclusive utilizing a ATEC plate   Discharge Exam: Blood pressure (!) 139/97, pulse 69, temperature 98.2 F (36.8 C), temperature source Oral, resp. rate 20, height 5' 6  (1.676 m), weight 72.6 kg, SpO2 97%.  Alert and oriented x 4 PERRLA CN II-XII grossly intact MAE, Strength and sensation intact Incision is covered with Honeycomb dressing and Steri Strips; Dressing with small amount of sanguinous drainage, not actively draining. Site with some adjacent ecchymosis.   Disposition: Discharge disposition: 01-Home or Self Care       Discharge Instructions     Call MD for:  difficulty breathing, headache or visual disturbances   Complete by: As directed    Call MD for:  hives   Complete by: As directed    Call MD for:  persistant nausea and vomiting   Complete by: As directed    Call MD for:  redness, tenderness, or signs of infection (pain, swelling, redness, odor or green/yellow discharge around incision site)   Complete by: As directed    Call MD for:  severe uncontrolled pain   Complete by: As directed    Diet - low sodium heart healthy   Complete by: As directed    If the dressing is still on your incision site when you go home, remove it on the third day after your surgery date. Remove dressing if it begins to fall off, or if it is dirty or damaged before the third day.   Complete by: As directed    Increase activity slowly   Complete by: As directed       Allergies as of 12/17/2023       Reactions   Topamax  [topiramate ]    Loss of control of his muscles   Celebrex [celecoxib]    REACTION: heartburn   Crestor [rosuvastatin] Hives   Azithromycin Diarrhea        Medication List  PAUSE taking these medications    aspirin  EC 81 MG tablet Wait to take this until: December 19, 2023 Take 81 mg by mouth daily. Swallow whole.       STOP taking these medications    Advil PM 200-25 MG Caps Generic drug: Ibuprofen-diphenhydrAMINE  HCl   ibuprofen 200 MG tablet Commonly known as: ADVIL       TAKE these medications    atorvastatin  40 MG tablet Commonly known as: LIPITOR  Take 1 tablet (40 mg total) by mouth daily.    carvedilol  25 MG tablet Commonly known as: COREG  Take 1 tablet (25 mg total) by mouth 2 (two) times daily.   HYDROcodone -acetaminophen  10-325 MG tablet Commonly known as: NORCO Take 1 tablet by mouth every 4 (four) hours as needed for moderate pain (pain score 4-6).   losartan  25 MG tablet Commonly known as: COZAAR  Take 1 tablet (25 mg total) by mouth daily.   methocarbamol  500 MG tablet Commonly known as: ROBAXIN  Take 1 tablet (500 mg total) by mouth every 6 (six) hours as needed for muscle spasms.   multivitamin with minerals tablet Take 1 tablet by mouth daily.   senna 8.6 MG Tabs tablet Commonly known as: SENOKOT Take 1 tablet (8.6 mg total) by mouth 2 (two) times daily.               Discharge Care Instructions  (From admission, onward)           Start     Ordered   12/17/23 0000  If the dressing is still on your incision site when you go home, remove it on the third day after your surgery date. Remove dressing if it begins to fall off, or if it is dirty or damaged before the third day.        12/17/23 0855            Follow-up Information     Joshua Alm Hamilton, MD. Go on 12/27/2023.   Specialty: Neurosurgery Why: First post op appointment is on 12/27/2023 at 1:45 PM. Contact information: [address] [place] [address] [phone]                 Signed: Gerard Beck, DNP, AGNP-C Nurse Practitioner  [facility] [address], [address], [place], [place] [address] P: [phone]    F: [phone]  12/17/2023, 8:55 AM

## 2023-12-17 NOTE — Discharge Instructions (Signed)
 Wound Care Keep incision covered and dry until post op day 3. You may remove the Honeycomb dressing on post op day 3. Leave steri-strips on neck.  They will fall off by themselves. Do not put any creams, lotions, or ointments on incision. You are fine to shower. Let water run over incision and pat dry.  Activity Walk each and every day, increasing distance each day. No lifting greater than 5 lbs.  Avoid excessive neck motion. No driving for 2 weeks; may ride as a passenger locally.  Diet Resume your normal diet.   Return to Work Will be discussed at your follow up appointment.  Call Your Doctor If Any of These Occur Redness, drainage, or swelling at the wound.  Temperature greater than 101 degrees. Severe pain not relieved by pain medication. Incision starts to come apart.  Follow Up Appt Call [phone] today for appointment in 2 weeks if you don't already have one or for any problems.
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
## 2023-12-19 ENCOUNTER — Encounter (HOSPITAL_COMMUNITY): Payer: Self-pay | Admitting: Neurological Surgery

## 2023-12-21 MED FILL — Thrombin For Soln 5000 Unit: CUTANEOUS | Qty: 2 | Status: AC

## 2023-12-27 ENCOUNTER — Other Ambulatory Visit (HOSPITAL_COMMUNITY): Payer: Self-pay | Admitting: Neurological Surgery

## 2023-12-27 DIAGNOSIS — M5412 Radiculopathy, cervical region: Secondary | ICD-10-CM

## 2023-12-29 ENCOUNTER — Ambulatory Visit (HOSPITAL_COMMUNITY)
Admission: RE | Admit: 2023-12-29 | Discharge: 2023-12-29 | Disposition: A | Discharge status: Home / Self Care | Source: Ambulatory Visit | Attending: Neurological Surgery | Admitting: Neurological Surgery

## 2023-12-29 DIAGNOSIS — M5412 Radiculopathy, cervical region: Secondary | ICD-10-CM | POA: Diagnosis present

## 2024-01-30 ENCOUNTER — Telehealth: Payer: Self-pay | Admitting: Cardiology

## 2024-01-30 NOTE — Telephone Encounter (Signed)
 Patient is going to get insurance with [facility] [employer] and the insurance company is needing us  to contact them to verify what heart condition the patient has. The patient gave a phone number of [phone]. Please advise.

## 2024-02-10 DIAGNOSIS — M79642 Pain in left hand: Secondary | ICD-10-CM | POA: Diagnosis not present

## 2024-02-10 DIAGNOSIS — M542 Cervicalgia: Secondary | ICD-10-CM | POA: Diagnosis not present

## 2024-03-27 ENCOUNTER — Ambulatory Visit: Admitting: Cardiology

## 2024-06-12 ENCOUNTER — Ambulatory Visit (INDEPENDENT_AMBULATORY_CARE_PROVIDER_SITE_OTHER)

## 2024-06-12 ENCOUNTER — Other Ambulatory Visit (HOSPITAL_BASED_OUTPATIENT_CLINIC_OR_DEPARTMENT_OTHER): Payer: Self-pay

## 2024-06-12 VITALS — BP 139/98 | Ht 66.0 in | Wt 160.0 lb

## 2024-06-12 DIAGNOSIS — F17210 Nicotine dependence, cigarettes, uncomplicated: Secondary | ICD-10-CM

## 2024-06-12 DIAGNOSIS — Z0001 Encounter for general adult medical examination with abnormal findings: Secondary | ICD-10-CM

## 2024-06-12 DIAGNOSIS — Z0000 Encounter for general adult medical examination without abnormal findings: Secondary | ICD-10-CM

## 2024-06-12 DIAGNOSIS — Z1211 Encounter for screening for malignant neoplasm of colon: Secondary | ICD-10-CM

## 2024-06-12 MED ORDER — ATORVASTATIN CALCIUM 40 MG PO TABS
40.0000 mg | ORAL_TABLET | Freq: Every day | ORAL | 3 refills | Status: AC
  Filled 2024-06-12: qty 90, 90d supply, fill #0

## 2024-06-12 MED ORDER — TIZANIDINE HCL 4 MG PO TABS
4.0000 mg | ORAL_TABLET | Freq: Four times a day (QID) | ORAL | 0 refills | Status: AC | PRN
  Filled 2024-06-12: qty 40, 10d supply, fill #0
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
## 2024-06-12 NOTE — Patient Instructions (Signed)
 [name],  Thank you for taking the time for your Medicare Wellness Visit. I appreciate your continued commitment to your health goals. Please review the care plan we discussed, and feel free to reach out if I can assist you further.  Please note that Annual Wellness Visits do not include a physical exam. Some assessments may be limited, especially if the visit was conducted virtually. If needed, we may recommend an in-person follow-up with your provider.  Ongoing Care: An acute visit was schedule for you for FRIDAY June 15, 2024 AT 10:00 AM TO SEE LAURA HUENINK TO FOLLOW UP ON ELEVATED HOME BLOOD PRESSURE READINGS. PLEASE KEEP THIS APPOINTMENT SO WE CAN MAKE SURE YOU ARE RECEIVING THE BEST CARE POSSIBLE.  Seeing your primary care provider every 3 to 6 months helps us  monitor your health and provide consistent, personalized care.   1 year follow up for Medicare well visit: June 14, 2025 at 8:00 am with medicare wellness nurse in office  Referrals If a referral was made during today's visit and you haven't received any updates within two weeks, please contact the referred provider directly to check on the status.  Lung Cancer Screening-[place] Office [address]-First Floor Medical Building directly across from [facility] Phone [phone]  [facility] at  [address] Phone: [phone]  I sent you several resources for food that we discussed during our visit today.   [facility] Health Financial Assistance application was emailed to you. Please complete the form and send it back in.   If you are unable to login to your Mychart, please call [phone] for assistance.   Recommended Screenings:  Health Maintenance  Topic Date Due   COVID-19 Vaccine (3 - Moderna risk series) 10/02/2020   Colon Cancer Screening  01/26/2022   Medicare Annual Wellness Visit  03/31/2023   Screening for Lung Cancer   06/26/2023   DTaP/Tdap/Td vaccine (3 - Td or Tdap) 06/29/2023   Pneumococcal Vaccine for age over 45  Completed   Flu Shot  Completed   Hepatitis C Screening  Completed   HIV Screening  Completed   Zoster (Shingles) Vaccine  Completed   Hepatitis B Vaccine  Aged Out   Meningitis B Vaccine  Aged Out       06/12/2024    9:27 AM  Advanced Directives  Does Patient Have a Medical Advance Directive? No  Would patient like information on creating a medical advance directive? No - Patient declined    Vision: Annual vision screenings are recommended for early detection of glaucoma, cataracts, and diabetic retinopathy. These exams can also reveal signs of chronic conditions such as diabetes and high blood pressure.  Dental: Annual dental screenings help detect early signs of oral cancer, gum disease, and other conditions linked to overall health, including heart disease and diabetes.  Please see the attached documents for additional preventive care recommendations.

## 2024-06-13 ENCOUNTER — Encounter (INDEPENDENT_AMBULATORY_CARE_PROVIDER_SITE_OTHER): Payer: Self-pay | Admitting: *Deleted

## 2024-06-15 ENCOUNTER — Ambulatory Visit: Payer: Self-pay

## 2024-07-27 ENCOUNTER — Other Ambulatory Visit (HOSPITAL_BASED_OUTPATIENT_CLINIC_OR_DEPARTMENT_OTHER): Payer: Self-pay

## 2024-08-01 ENCOUNTER — Ambulatory Visit: Admitting: Surgical

## 2024-08-01 ENCOUNTER — Other Ambulatory Visit (HOSPITAL_BASED_OUTPATIENT_CLINIC_OR_DEPARTMENT_OTHER): Payer: Self-pay

## 2024-08-01 ENCOUNTER — Other Ambulatory Visit: Payer: Self-pay

## 2024-08-01 ENCOUNTER — Encounter: Payer: Self-pay | Admitting: Surgical

## 2024-08-01 DIAGNOSIS — M1611 Unilateral primary osteoarthritis, right hip: Secondary | ICD-10-CM

## 2024-08-01 DIAGNOSIS — M5416 Radiculopathy, lumbar region: Secondary | ICD-10-CM

## 2024-08-01 DIAGNOSIS — M25551 Pain in right hip: Secondary | ICD-10-CM

## 2024-08-01 MED ORDER — PREDNISONE 10 MG (21) PO TBPK
ORAL_TABLET | ORAL | 0 refills | Status: AC
  Filled 2024-08-01: qty 21, 6d supply, fill #0

## 2024-08-01 MED ORDER — BUPIVACAINE HCL 0.25 % IJ SOLN
5.0000 mL | INTRAMUSCULAR | Status: AC | PRN
  Administered 2024-08-01: 5 mL via INTRA_ARTICULAR

## 2024-08-01 MED ORDER — LIDOCAINE HCL 1 % IJ SOLN
5.0000 mL | INTRAMUSCULAR | Status: AC | PRN
  Administered 2024-08-01: 5 mL
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
## 2024-08-06 ENCOUNTER — Ambulatory Visit: Payer: Self-pay

## 2025-06-14 ENCOUNTER — Ambulatory Visit: Payer: Self-pay
